# Patient Record
Sex: Male | Born: 1955
Health system: Southern US, Community
[De-identification: ages and names within clinical notes are randomized; demographics above are authoritative.]

## PROBLEM LIST (undated history)

## (undated) DIAGNOSIS — Z9289 Personal history of other medical treatment: Secondary | ICD-10-CM

## (undated) DIAGNOSIS — I480 Paroxysmal atrial fibrillation: Secondary | ICD-10-CM

## (undated) DIAGNOSIS — D649 Anemia, unspecified: Secondary | ICD-10-CM

## (undated) DIAGNOSIS — I10 Essential (primary) hypertension: Secondary | ICD-10-CM

## (undated) DIAGNOSIS — M545 Low back pain: Secondary | ICD-10-CM

## (undated) DIAGNOSIS — K635 Polyp of colon: Secondary | ICD-10-CM

## (undated) DIAGNOSIS — I499 Cardiac arrhythmia, unspecified: Secondary | ICD-10-CM

## (undated) DIAGNOSIS — R011 Cardiac murmur, unspecified: Secondary | ICD-10-CM

## (undated) DIAGNOSIS — G8929 Other chronic pain: Secondary | ICD-10-CM

## (undated) DIAGNOSIS — F419 Anxiety disorder, unspecified: Secondary | ICD-10-CM

## (undated) DIAGNOSIS — C61 Malignant neoplasm of prostate: Secondary | ICD-10-CM

## (undated) DIAGNOSIS — F32A Depression, unspecified: Secondary | ICD-10-CM

## (undated) DIAGNOSIS — K602 Anal fissure, unspecified: Secondary | ICD-10-CM

## (undated) DIAGNOSIS — C775 Secondary and unspecified malignant neoplasm of intrapelvic lymph nodes: Secondary | ICD-10-CM

## (undated) DIAGNOSIS — I421 Obstructive hypertrophic cardiomyopathy: Secondary | ICD-10-CM

## (undated) HISTORY — PX: ACHILLES TENDON REPAIR: SUR1153

## (undated) HISTORY — DX: Cardiac murmur, unspecified: R01.1

## (undated) HISTORY — DX: Malignant neoplasm of prostate: C61

## (undated) HISTORY — DX: Anal fissure, unspecified: K60.2

## (undated) HISTORY — DX: Polyp of colon: K63.5

## (undated) HISTORY — DX: Anxiety disorder, unspecified: F41.9

## (undated) HISTORY — DX: Secondary and unspecified malignant neoplasm of intrapelvic lymph nodes: C61

## (undated) HISTORY — DX: Personal history of other medical treatment: Z92.89

## (undated) HISTORY — PX: PROSTATE BIOPSY: SHX241

## (undated) HISTORY — DX: Secondary and unspecified malignant neoplasm of intrapelvic lymph nodes: C77.5

## (undated) HISTORY — DX: Essential (primary) hypertension: I10

## (undated) HISTORY — PX: COLONOSCOPY: SHX174

---

## 1999-12-01 ENCOUNTER — Ambulatory Visit (HOSPITAL_COMMUNITY): Admission: RE | Admit: 1999-12-01 | Discharge: 1999-12-01 | Payer: Self-pay | Admitting: Family Medicine

## 1999-12-01 ENCOUNTER — Encounter: Payer: Self-pay | Admitting: Family Medicine

## 2002-06-05 HISTORY — PX: EYE SURGERY: SHX253

## 2006-06-05 HISTORY — PX: ANAL FISSURECTOMY: SUR608

## 2007-04-12 ENCOUNTER — Ambulatory Visit: Payer: Self-pay | Admitting: Family Medicine

## 2007-04-12 DIAGNOSIS — I10 Essential (primary) hypertension: Secondary | ICD-10-CM | POA: Insufficient documentation

## 2007-04-12 DIAGNOSIS — R9431 Abnormal electrocardiogram [ECG] [EKG]: Secondary | ICD-10-CM | POA: Insufficient documentation

## 2007-04-12 DIAGNOSIS — Z8679 Personal history of other diseases of the circulatory system: Secondary | ICD-10-CM | POA: Insufficient documentation

## 2007-04-12 DIAGNOSIS — H8110 Benign paroxysmal vertigo, unspecified ear: Secondary | ICD-10-CM | POA: Insufficient documentation

## 2007-04-16 ENCOUNTER — Telehealth (INDEPENDENT_AMBULATORY_CARE_PROVIDER_SITE_OTHER): Payer: Self-pay | Admitting: *Deleted

## 2007-04-26 ENCOUNTER — Ambulatory Visit: Payer: Self-pay | Admitting: Family Medicine

## 2007-04-29 ENCOUNTER — Ambulatory Visit: Payer: Self-pay | Admitting: Cardiology

## 2007-05-01 ENCOUNTER — Encounter (INDEPENDENT_AMBULATORY_CARE_PROVIDER_SITE_OTHER): Payer: Self-pay | Admitting: *Deleted

## 2007-05-13 ENCOUNTER — Ambulatory Visit: Payer: Self-pay | Admitting: Family Medicine

## 2007-05-13 DIAGNOSIS — D492 Neoplasm of unspecified behavior of bone, soft tissue, and skin: Secondary | ICD-10-CM | POA: Insufficient documentation

## 2007-05-13 LAB — CONVERTED CEMR LAB
BUN: 15 mg/dL (ref 6–23)
CO2: 31 meq/L (ref 19–32)
Calcium: 9.5 mg/dL (ref 8.4–10.5)
Chloride: 96 meq/L (ref 96–112)
Creatinine, Ser: 1.4 mg/dL (ref 0.4–1.5)
GFR calc Af Amer: 69 mL/min
GFR calc non Af Amer: 57 mL/min
Glucose, Bld: 77 mg/dL (ref 70–99)
Potassium: 3.2 meq/L — ABNORMAL LOW (ref 3.5–5.1)
Sodium: 135 meq/L (ref 135–145)

## 2007-05-14 LAB — CONVERTED CEMR LAB
BUN: 19 mg/dL (ref 6–23)
CO2: 32 meq/L (ref 19–32)
Calcium: 9.5 mg/dL (ref 8.4–10.5)
Chloride: 103 meq/L (ref 96–112)
Creatinine, Ser: 1.4 mg/dL (ref 0.4–1.5)
GFR calc Af Amer: 69 mL/min
GFR calc non Af Amer: 57 mL/min
Glucose, Bld: 115 mg/dL — ABNORMAL HIGH (ref 70–99)
Potassium: 3.7 meq/L (ref 3.5–5.1)
Sodium: 141 meq/L (ref 135–145)

## 2007-05-15 ENCOUNTER — Encounter: Payer: Self-pay | Admitting: Cardiology

## 2007-05-15 ENCOUNTER — Ambulatory Visit: Payer: Self-pay

## 2007-05-15 ENCOUNTER — Ambulatory Visit: Payer: Self-pay | Admitting: Cardiology

## 2007-06-14 ENCOUNTER — Encounter (INDEPENDENT_AMBULATORY_CARE_PROVIDER_SITE_OTHER): Payer: Self-pay | Admitting: Family Medicine

## 2007-06-17 ENCOUNTER — Ambulatory Visit: Payer: Self-pay | Admitting: Family Medicine

## 2007-06-17 DIAGNOSIS — E785 Hyperlipidemia, unspecified: Secondary | ICD-10-CM | POA: Insufficient documentation

## 2007-06-18 ENCOUNTER — Telehealth (INDEPENDENT_AMBULATORY_CARE_PROVIDER_SITE_OTHER): Payer: Self-pay | Admitting: *Deleted

## 2007-06-18 ENCOUNTER — Encounter (INDEPENDENT_AMBULATORY_CARE_PROVIDER_SITE_OTHER): Payer: Self-pay | Admitting: *Deleted

## 2007-06-18 LAB — CONVERTED CEMR LAB
ALT: 37 units/L (ref 0–53)
AST: 22 units/L (ref 0–37)
BUN: 12 mg/dL (ref 6–23)
CO2: 32 meq/L (ref 19–32)
Calcium: 9.3 mg/dL (ref 8.4–10.5)
Chloride: 101 meq/L (ref 96–112)
Cholesterol: 131 mg/dL (ref 0–200)
Creatinine, Ser: 1.3 mg/dL (ref 0.4–1.5)
GFR calc Af Amer: 75 mL/min
GFR calc non Af Amer: 62 mL/min
Glucose, Bld: 90 mg/dL (ref 70–99)
HDL: 35.8 mg/dL — ABNORMAL LOW (ref >39.0)
LDL Cholesterol: 85 mg/dL (ref 0–99)
Potassium: 3.9 meq/L (ref 3.5–5.1)
Sodium: 141 meq/L (ref 135–145)
Total CHOL/HDL Ratio: 3.7
Triglycerides: 51 mg/dL (ref 0–149)
VLDL: 10 mg/dL (ref 0–40)

## 2007-07-10 ENCOUNTER — Ambulatory Visit (HOSPITAL_BASED_OUTPATIENT_CLINIC_OR_DEPARTMENT_OTHER): Admission: RE | Admit: 2007-07-10 | Discharge: 2007-07-10 | Payer: Self-pay | Admitting: General Surgery

## 2010-07-03 LAB — CONVERTED CEMR LAB
ALT: 35 U/L
AST: 21 U/L
Albumin: 4.3 g/dL
Alkaline Phosphatase: 73 U/L
BUN: 15 mg/dL
Basophils Absolute: 0 K/uL
Basophils Relative: 0.4 %
Bilirubin, Direct: 0.1 mg/dL
CO2: 31 meq/L
Calcium: 9.3 mg/dL
Chloride: 105 meq/L
Cholesterol: 204 mg/dL
Creatinine, Ser: 1.4 mg/dL
Direct LDL: 162.4 mg/dL
Eosinophils Absolute: 0.3 K/uL
Eosinophils Relative: 4.2 %
GFR calc Af Amer: 69 mL/min
GFR calc non Af Amer: 57 mL/min
Glucose, Bld: 83 mg/dL
HCT: 44.9 %
HDL: 37.8 mg/dL — ABNORMAL LOW
Hemoglobin: 14.9 g/dL
Lymphocytes Relative: 25 %
MCHC: 33.2 g/dL
MCV: 82.8 fL
Magnesium: 2 mg/dL
Monocytes Absolute: 0.4 K/uL
Monocytes Relative: 5.3 %
Neutro Abs: 4.5 K/uL
Neutrophils Relative %: 65.1 %
Phosphorus: 2.5 mg/dL
Platelets: 181 K/uL
Potassium: 4 meq/L
RBC: 5.42 M/uL
RDW: 13.2 %
Sodium: 143 meq/L
Total Bilirubin: 0.9 mg/dL
Total CHOL/HDL Ratio: 5.4
Total Protein: 7.2 g/dL
Triglycerides: 66 mg/dL
VLDL: 13 mg/dL
WBC: 7 10*3/microliter

## 2010-07-05 NOTE — Assessment & Plan Note (Signed)
Summary: 1 month f/u//ca   Vital Signs:  Patient Profile:   55 Years Old Male Height:     71.75 inches Weight:      225 pounds Temp:     98.2 degrees F oral Pulse rate:   80 / minute Resp:     16 per minute BP sitting:   136 / 86  (right arm)  Pt. in pain?   no  Vitals Entered By: Ardyth Man (May 13, 2007 2:01 PM)                  Chief Complaint:  1 month f/u on BP.  History of Present Illness: Reports he is doing well. tolerating medication well. No side effects.  Recently seen by Cardiology and scheduled for Stress Echo.   Mr. Code also reports he has rectal lesion that has been present for a long while, but recently noted a new one. Painless.  Current Allergies: No known allergies       Physical Exam  General:     Well-developed,well-nourished,in no acute distress; alert,appropriate and cooperative throughout examination Neck:     No deformities, masses, or tenderness noted. Lungs:     Normal respiratory effort, chest expands symmetrically. Lungs are clear to auscultation, no crackles or wheezes. Heart:     Normal rate and regular rhythm. S1 and S2 normal without gallop, murmur, click, rub or other extra sounds. no murmurs heard on my exam today Rectal:     2 pinkish external anal polyp appearing lesions. Nontender. Extremities:     No clubbing, cyanosis, edema, or deformity noted with normal full range of motion of all joints.      Impression & Recommendations:  Problem # 1:  HYPERTENSION, BENIGN ESSENTIAL (ICD-401.1) Improved Continue current medication Check BMET and F/u in 3 months or sooner if needed His updated medication list for this problem includes:    Hydrochlorothiazide 25 Mg Tabs (Hydrochlorothiazide) .Marland Kitchen... 1 by mouth once daily.  Orders: TLB-BMP (Basic Metabolic Panel-BMET) (80048-METABOL) Venipuncture (16109)  BP today: 136/86 Prior BP: 150/95 (04/12/2007)  Labs Reviewed: Creat: 1.4 (04/26/2007) Chol: 204  (04/12/2007)   HDL: 37.8 (04/12/2007)   LDL: DEL (04/12/2007)   TG: 66 (04/12/2007)   Problem # 2:  NEOPLASM, SKIN,SOFT TISSUE, UNSPEC. (ICD-239.2)  Will refer to General surgeon for removal. Orders: Surgical Referral (Surgery)   Complete Medication List: 1)  Hydrochlorothiazide 25 Mg Tabs (Hydrochlorothiazide) .Marland Kitchen.. 1 by mouth once daily. 2)  Zocor 40 Mg Tabs (Simvastatin) .... Take one tablet daily     ]

## 2010-07-05 NOTE — Progress Notes (Signed)
  Phone Note Outgoing Call Call back at [facility] Phone [phone]   Call placed by: Ardyth Man,  April 16, 2007 12:50 PM Call placed to: Patient Summary of Call: [facility] w/spouse ...................................................................Ardyth Man  April 16, 2007 12:50 PM

## 2010-07-05 NOTE — Letter (Signed)
Summary: Handout Printed  Printed Handout:  - *[facility] Patient Instructions 

## 2010-07-05 NOTE — Progress Notes (Signed)
  Phone Note Outgoing Call Call back at [facility] Phone [phone]   Call placed by: Ardyth Man,  April 16, 2007 12:50 PM Call placed to: Patient Request: Send information Summary of Call: [facility] w/spouse ...................................................................Ardyth Man  April 16, 2007 12:51 PM   Follow-up for Phone Call        Pt. aware, rx sent electronically and pt. has labs already scheduled....................................................................Ardyth Man  April 16, 2007 12:59 PM  Follow-up by: Ardyth Man,  April 16, 2007 12:59 PM    New/Updated Medications: ZOCOR 40 MG  TABS (SIMVASTATIN) Take one tablet daily   Prescriptions: ZOCOR 40 MG  TABS (SIMVASTATIN) Take one tablet daily  #30 x 3   Entered by:   Ardyth Man   Authorized by:   Leanne Chang MD   Signed by:   Ardyth Man on 04/16/2007   Method used:   Electronically sent to ...       [facility] [address]       [address]       [place], [place]  [address]       Ph: [phone]       Fax: [phone]   RxID:   [id number]

## 2010-10-18 NOTE — Op Note (Signed)
NAMECORTLIN, MARANO               ACCOUNT NO.:  [id number]   MEDICAL RECORD NO.:  [id number]          PATIENT TYPE:  AMB   LOCATION:  [facility]                         FACILITY:  [facility]   PHYSICIAN:  Anselm Pancoast. 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   OPERATION:  Examination under anesthesia and fistulotomy with internal  sphincterotomy.   ANESTHESIA:  General anesthesia at the anterior.   HISTORY:  Odell Choung is a 55 year old male, who I first saw  approximately 6 weeks ago when he was having a lot of episodes of pain  spasm.  He had a little anal tag that was palpable, and he was  contributing his pain to the little anal tag.  On examination when I  first saw him in the first week of January, I found no evidence of any  perirectal abscess; but, you could see an anterior fistula with a  __________ papilla distal to that.  You could not examine him very well  because he clamped down so hard because of the pain.  I could not  demonstrate anything that seemed like a fistula in ano, but a fissure.  I excised the little anal tag and sent it for pathology examination; of  course it was just a skin tag.   The patient was improving, but then over the last week he has had  increasing pain and discomfort in the inner aspect of his left leg area.  I saw him in the office on Friday, and obviously he has had a perirectal  abscess that had drained at probably the approximately 1 o'clock  position, just outside of the anal area.  I could put a probe into a  pocket, but I could not demonstrate the internal opening because of  pain.  I recommended that we proceed on with an examination and internal  sphincterotomy and drainage of the abscess.  It was [employer]  weekend  and he had plans, and wanted to not do anything on the following morning  (which was Saturday morning).  Today was the first time to get him on  the OR schedule.  I did place him on antibiotics, and he says his pain  is much less now than when I saw him in the office on Friday.   On examination, he is febrile.  Preoperatively he was given a dose of  Unasyn.  He was taken back to the operative suite.  Induction of general  anesthesia with LAO tube, and placed him up in the yellow fin stirrups.  You could see the little area opening externally that was not as obvious  today as it was when I saw him in the office on Friday.  After prepping  the area with Betadine, I put a little probe in it; it goes down deep.  I could not actually feel the definite internal opening.  I then  took a  little saline and Betadine, and injected it with a blunt needle into the  little external opening.  You could see the little internal fistula that  was anterior.  I slipped a little probe through the external and  internal opening.  This does go deep, to approximately maybe a third of  the internal sphincter fibers.  I then divided it with cautery.   There was a little pocket kind of up in the area, and I opened the  little area externally so that when he has a bowel movement it will not  kind of hang up on this little fold.  I washed the area and could not  find any evidence of any other abscess cavity.   I then used 3-0 chromic to suture the proximal portion, since it  basically in an internal hemorrhoid area.  This is so that hopefully he  will not have any bleeding.  I then also put a couple of figure-of-eight  sutures on both the left and right side of the anal mucosa for  hemostasis (even though there was not a significant amount of bleeding).  I then put Xylocaine ointment within the anal canal; held in place with  a 4x4, and then left just a little packing in.   He will continue on his  antibiotics.  I will plan on seeing him back in  the office in approximately one week.  Hopefully he will be able to  return to work on Monday.   Whether this actually started off as a fissure, or whether he has had a  fistula in ano all along, I am sure I certainly was not able to find any  evidence of an abscess when I first saw him one month ago.   REFERRING PHYSICIAN:  Leanne Chang, M.D.           ______________________________  Anselm Pancoast. Zachery Dakins, M.D.     WJW/MEDQ  D:  07/10/2007  T:  07/11/2007  Job:  [id number]

## 2010-10-18 NOTE — Assessment & Plan Note (Signed)
[facility]                            CARDIOLOGY OFFICE NOTE   NAME:David Smith, David Smith                      MRN:          [id number]  DATE:04/29/2007                            DOB:          1956-01-31    CHIEF COMPLAINT:  Abnormal EKG and shortness of breath.   HISTORY OF PRESENT ILLNESS:  David Smith is a very pleasant, active 55-  year-old married male with 3 children.  He has recently been evaluated  by Dr. Blossom Hoops and was found to have new onset hypertension.  He had  an EKG, which showed normal sinus rhythm with a left axis deviation and  probable early changes of LVH.   His biggest complaint is that of dyspnea on exertion.  This happens when  he carries some firewood into the house or goes up steps.  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   PAST MEDICAL HISTORY:  He has been told he has a heart murmur since he  was about 18.  He does not smoke.  Does not use any recreational drugs.  He drinks about 6 beers per month and had 1 beer in the last year.  He  has 1 coffee a day.   He has no known drug allergies.   CURRENT MEDICATIONS:  1. Simvastatin 40 mg a day.  2. Hydrochlorothiazide 25 mg a day.   He has hyperlipidemia.   SURGICAL HISTORY:  Achilles tendon tear in 1986.   FAMILY HISTORY:  Mother had a heart attack at age 32.   SOCIAL HISTORY:  He is married and has 3 children.  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   REVIEW OF SYSTEMS:  Other than the HPI, negative.   EXAM:  His blood pressure is 136/84, pulse 57 and sinus brady, and  regular.  His weight is 226.  He is 6 feet 2 inches.  HEENT:  Normocephalic, atraumatic.  PERRLA.  Extraocular movements  intact.  Sclerae slightly injected.  Facial symmetry is normal.  Dentition is satisfactory.  NECK:  Supple.  Carotid upstrokes are equal bilaterally without obvious  bruit or referred murmur.  Thyroid is not enlarged.  Trachea is  midline.  LUNGS:  Clear.  HEART:  Reveals a nondisplaced PMI.  He has a systolic murmur along the  left sternal border.  His S2 splits physiologically.  There is no  diastolic component.  ABDOMEN:  Soft with good bowel sounds.  No midline bruit.  No  hepatomegaly.  EXTREMITIES:  With no cyanosis, clubbing, or edema.  Pulses are intact.  NEURO:  Intact.  SKIN:  Unremarkable.   ASSESSMENT:  1. Abnormal EKG.  These findings are most likely attributable to a      left ventricular hypertrophy with hypertension.  He may have had      hypertension longer than he knows.  2. Probable benign flow murmur.  3. Dyspnea on exertion, rule out ischemic equivalent.  4. Hyperlipidemia on simvastatin.   PLAN:  Exercise rest-stress echocardiogram.  We can evaluate left  ventricular  function, question of LVH, look at his aortic valve, and  rule out any significant coronary disease.   We will plan on seeing him back on a p.r.n. basis.     Thomas C. Daleen Squibb, MD, [facility]  Electronically Signed    TCW/MedQ  DD: 04/29/2007  DT: 04/29/2007  Job #: [id number]   cc:   Leanne Chang, M.D.

## 2011-02-24 LAB — POCT I-STAT 4, (NA,K, GLUC, HGB,HCT)
Glucose, Bld: 91
HCT: 48
Hemoglobin: 16.3
Operator id: 280881
Potassium: 3.7
Sodium: 139

## 2011-09-29 ENCOUNTER — Encounter: Payer: Self-pay | Admitting: Internal Medicine

## 2011-09-29 ENCOUNTER — Ambulatory Visit (INDEPENDENT_AMBULATORY_CARE_PROVIDER_SITE_OTHER): Payer: Self-pay | Admitting: Internal Medicine

## 2011-09-29 VITALS — BP 148/90 | HR 62 | Temp 98.4°F | Ht 71.5 in | Wt 228.0 lb

## 2011-09-29 DIAGNOSIS — M25559 Pain in unspecified hip: Secondary | ICD-10-CM | POA: Insufficient documentation

## 2011-09-29 DIAGNOSIS — E785 Hyperlipidemia, unspecified: Secondary | ICD-10-CM

## 2011-09-29 DIAGNOSIS — I10 Essential (primary) hypertension: Secondary | ICD-10-CM

## 2011-09-29 LAB — BASIC METABOLIC PANEL
BUN: 18 mg/dL (ref 6–23)
CO2: 27 mEq/L (ref 19–32)
Calcium: 9.1 mg/dL (ref 8.4–10.5)
Chloride: 106 mEq/L (ref 96–112)
Creatinine, Ser: 1.2 mg/dL (ref 0.4–1.5)
GFR: 80.45 mL/min (ref >60.00)
Glucose, Bld: 83 mg/dL (ref 70–99)
Potassium: 4 mEq/L (ref 3.5–5.1)
Sodium: 141 mEq/L (ref 135–145)

## 2011-09-29 LAB — CBC WITH DIFFERENTIAL/PLATELET
Basophils Absolute: 0 10*3/uL (ref 0.0–0.1)
Basophils Relative: 0.4 % (ref 0.0–3.0)
Eosinophils Absolute: 0.3 10*3/uL (ref 0.0–0.7)
Eosinophils Relative: 5.3 % — ABNORMAL HIGH (ref 0.0–5.0)
HCT: 41.8 % (ref 39.0–52.0)
Hemoglobin: 13.6 g/dL (ref 13.0–17.0)
Lymphocytes Relative: 22.8 % (ref 12.0–46.0)
Lymphs Abs: 1.5 10*3/uL (ref 0.7–4.0)
MCHC: 32.4 g/dL (ref 30.0–36.0)
MCV: 82.3 fl (ref 78.0–100.0)
Monocytes Absolute: 0.4 10*3/uL (ref 0.1–1.0)
Monocytes Relative: 5.7 % (ref 3.0–12.0)
Neutro Abs: 4.2 10*3/uL (ref 1.4–7.7)
Neutrophils Relative %: 65.8 % (ref 43.0–77.0)
Platelets: 146 10*3/uL — ABNORMAL LOW (ref 150.0–400.0)
RBC: 5.08 Mil/uL (ref 4.22–5.81)
RDW: 14.8 % — ABNORMAL HIGH (ref 11.5–14.6)
WBC: 6.4 10*3/uL (ref 4.5–10.5)

## 2011-09-29 MED ORDER — LOSARTAN POTASSIUM-HCTZ 50-12.5 MG PO TABS: 1.0000 | ORAL_TABLET | Freq: Every day | ORAL | Status: DC

## 2011-09-29 NOTE — Progress Notes (Signed)
  Subjective:    Patient ID: David Smith, male    DOB: [birth year], 56 y.o.   MRN: [id number]  HPI New patient , he used to see another doctor in the practice (Dr Blossom Hoops) but hasn't been seen in years. He has a history of hypertension, high cholesterol, used to take hydrochlorothiazide and simvastatin with reportedly good results and no side effects. He ran out of such medicines few years ago.  Past medical history Hyperlipidemia  HTN Heart murmur as a teen Stress ECHO 2008 (-)  Past surgical history Rectal fissure surgery Achilles tendon repair in the 80s  Social history Married, children x 3 (last is 3 y/o) Tobacco--  Never  ETOH-- wine-beers socially    Family history Diabetes--no CAD--no Stroke--no Leukemia-- nephew  Colon cancer-- no Breast cancer-- M Prostate cancer-- F , dx at age in his 2, currently 87 and doing well   Review of Systems In general feels well, denies any chest pain, shortness or breath. No nausea, vomiting, diarrhea. 4 weeks ago, developed a right groin pain, it radiated anteriorly to the leg and sometimes to the R back. Symptoms essentially resolved. Pain was worse w/ certain positions, no testicular pain, no mass @ the groin Denies any headaches or nosebleeds. He is ambulatory BPs have been elevated, systolic BP up to 190. Does not recall the diastolic readings.     Objective:   Physical Exam General -- alert, well-developed, and nooverweight appearing. No apparent distress.  Neck --no  thyromegaly Lungs -- normal respiratory effort, no intercostal retractions, no accessory muscle use, and normal breath sounds.   Heart-- normal rate, regular rhythm, no murmur, and no gallop.   Abdomen--soft, non-tender, no distention, no masses, no HSM, no guarding, and no rigidity.  No evidence of inguinal hernias on physical exam. Extremities-- no pretibial edema bilaterally , hip rotation is normal bilaterally, mild discomfort in the right groin with  right hip rotation. Neurologic-- alert & oriented X3 and strength normal in all extremities. Psych-- Cognition and judgment appear intact. Alert and cooperative with normal attention span and concentration.  not anxious appearing and not depressed appearing.       Assessment & Plan:

## 2011-09-29 NOTE — Assessment & Plan Note (Signed)
H/O high cholesterol, come back for a CPX in 3 weeks, will check his cholesterol then.

## 2011-09-29 NOTE — Assessment & Plan Note (Addendum)
History of hypertension, on no medications for a while, previously treated with hydrochlorothiazide with apparent good results. His blood pressure is elevated today, he is asymptomatic. Plan: EKG at baseline, no acute  BMP, CBC Start losartan HCTZ Return to the office in 3 weeks for an assessment.

## 2011-09-29 NOTE — Assessment & Plan Note (Signed)
Suspect right groin pain is likely from the hip. Recommend Tylenol and observation for now.

## 2011-10-02 ENCOUNTER — Encounter: Payer: Self-pay | Admitting: Internal Medicine

## 2011-11-23 ENCOUNTER — Encounter: Payer: Self-pay | Admitting: Internal Medicine

## 2011-11-23 ENCOUNTER — Telehealth: Payer: Self-pay | Admitting: *Deleted

## 2011-11-23 ENCOUNTER — Ambulatory Visit (INDEPENDENT_AMBULATORY_CARE_PROVIDER_SITE_OTHER): Payer: Self-pay | Admitting: Internal Medicine

## 2011-11-23 VITALS — BP 142/88 | HR 61 | Temp 98.6°F | Ht 71.5 in | Wt 232.0 lb

## 2011-11-23 DIAGNOSIS — Z23 Encounter for immunization: Secondary | ICD-10-CM

## 2011-11-23 DIAGNOSIS — N402 Nodular prostate without lower urinary tract symptoms: Secondary | ICD-10-CM

## 2011-11-23 DIAGNOSIS — Z0000 Encounter for general adult medical examination without abnormal findings: Secondary | ICD-10-CM | POA: Insufficient documentation

## 2011-11-23 DIAGNOSIS — M25559 Pain in unspecified hip: Secondary | ICD-10-CM

## 2011-11-23 DIAGNOSIS — I10 Essential (primary) hypertension: Secondary | ICD-10-CM

## 2011-11-23 LAB — CBC WITH DIFFERENTIAL/PLATELET
Basophils Absolute: 0 10*3/uL (ref 0.0–0.1)
Basophils Relative: 0.4 % (ref 0.0–3.0)
Eosinophils Absolute: 0.2 10*3/uL (ref 0.0–0.7)
Eosinophils Relative: 2.6 % (ref 0.0–5.0)
HCT: 43.7 % (ref 39.0–52.0)
Hemoglobin: 14.2 g/dL (ref 13.0–17.0)
Lymphocytes Relative: 26.7 % (ref 12.0–46.0)
Lymphs Abs: 1.9 10*3/uL (ref 0.7–4.0)
MCHC: 32.5 g/dL (ref 30.0–36.0)
MCV: 82.8 fl (ref 78.0–100.0)
Monocytes Absolute: 0.3 10*3/uL (ref 0.1–1.0)
Monocytes Relative: 4.3 % (ref 3.0–12.0)
Neutro Abs: 4.7 10*3/uL (ref 1.4–7.7)
Neutrophils Relative %: 66 % (ref 43.0–77.0)
Platelets: 179 10*3/uL (ref 150.0–400.0)
RBC: 5.28 Mil/uL (ref 4.22–5.81)
RDW: 15 % — ABNORMAL HIGH (ref 11.5–14.6)
WBC: 7.1 10*3/uL (ref 4.5–10.5)

## 2011-11-23 LAB — COMPREHENSIVE METABOLIC PANEL
ALT: 29 U/L (ref 0–53)
AST: 23 U/L (ref 0–37)
Albumin: 4.4 g/dL (ref 3.5–5.2)
Alkaline Phosphatase: 74 U/L (ref 39–117)
BUN: 17 mg/dL (ref 6–23)
CO2: 29 mEq/L (ref 19–32)
Calcium: 9.1 mg/dL (ref 8.4–10.5)
Chloride: 106 mEq/L (ref 96–112)
Creatinine, Ser: 1.4 mg/dL (ref 0.4–1.5)
GFR: 67.39 mL/min (ref >60.00)
Glucose, Bld: 88 mg/dL (ref 70–99)
Potassium: 3.8 mEq/L (ref 3.5–5.1)
Sodium: 141 mEq/L (ref 135–145)
Total Bilirubin: 0.7 mg/dL (ref 0.3–1.2)
Total Protein: 7.3 g/dL (ref 6.0–8.3)

## 2011-11-23 LAB — LIPID PANEL
Cholesterol: 188 mg/dL (ref 0–200)
HDL: 46.6 mg/dL (ref >39.00)
LDL Cholesterol: 131 mg/dL — ABNORMAL HIGH (ref 0–99)
Total CHOL/HDL Ratio: 4
Triglycerides: 54 mg/dL (ref 0.0–149.0)
VLDL: 10.8 mg/dL (ref 0.0–40.0)

## 2011-11-23 LAB — PSA: PSA: 34.25 ng/mL — ABNORMAL HIGH (ref 0.10–4.00)

## 2011-11-23 LAB — TSH: TSH: 1.03 u[IU]/mL (ref 0.35–5.50)

## 2011-11-23 MED ORDER — LOSARTAN POTASSIUM-HCTZ 100-25 MG PO TABS: 1.0000 | ORAL_TABLET | Freq: Every day | ORAL | Status: DC

## 2011-11-23 NOTE — Assessment & Plan Note (Signed)
We talk about possible orthopedic referral, patient prefers to wait a few weeks. Recommend Tylenol as needed.

## 2011-11-23 NOTE — Telephone Encounter (Signed)
Pt left VM that he was seen today but has another question that he would like to ask Dr Drue Novel. Pt request a call back. .Left message to call office

## 2011-11-23 NOTE — Assessment & Plan Note (Signed)
Tolerated losartan HCT well, BP slightly elevated today, normal motor BPs. Plan: Increase losartan dose to 100. See new prescription and instructions

## 2011-11-23 NOTE — Telephone Encounter (Signed)
Spoke with pt, he is requesting that Dr. Drue Novel call him for about a conversation. Pt did not want to tell me what it was regarding & only wanted to talk to Dr. Drue Novel. Best # to reach pt at is [phone].

## 2011-11-23 NOTE — Progress Notes (Signed)
  Subjective:    Patient ID: David Smith, male    DOB: Mar 31, 1956, 56 y.o.   MRN: [id number]  HPI Complete physical exam  Past medical history Hyperlipidemia   HTN Heart murmur as a teen Stress ECHO 2008 (-)  Past surgical history Rectal fissure surgery Achilles tendon repair in the 80s  Social history Married, children x 3 (last is 3 y/o) Tobacco--  Never   ETOH-- wine-beers socially  Occupation--  Actor Diet-- healthy most of the time  Exercise-- used to play tennis  Family history Diabetes--no CAD--no Stroke--no Leukemia-- nephew   Colon cancer-- no Breast cancer-- M Prostate cancer-- F , dx at age in his 100, currently 45 and doing well    Review of Systems Since the last time he was here, he started losartan, no side effects, no ambulatory BPs. Continue with right hip pain. Not severe but still bothering him some days. Denies any chest pain, shortness of breath Note nausea, vomiting, diarrhea. No blood in the stools. No dysuria or gross hematuria.    Objective:   Physical Exam General -- alert, well-developed, and well-nourished.   Neck --no thyromegaly , normal carotid pulse Lungs -- normal respiratory effort, no intercostal retractions, no accessory muscle use, and normal breath sounds.   Heart-- normal rate, regular rhythm, no murmur, and no gallop.   Abdomen--soft, non-tender, no distention, no masses, no HSM, no guarding, and no rigidity.   Extremities-- no pretibial edema bilaterally Rectal-- No external abnormalities noted. Normal sphincter tone. No rectal masses or tenderness. Brown stool, Hemoccult negative Prostate:  Has a 1 cm hard nodule on the left. Right side seems normal Neurologic-- alert & oriented X3 and strength normal in all extremities. Psych-- Cognition and judgment appear intact. Alert and cooperative with normal attention span and concentration.  not anxious appearing and not depressed appearing.       Assessment &  Plan:

## 2011-11-23 NOTE — Assessment & Plan Note (Addendum)
Tdap-- last many years ago per pt, provided one today Diet-exercise discussed Has a family history of prostate cancer, his brother has the elevated PSA. He has a prostate nodule. Check a PSA today but  but he will be referred to urology today for further eval of the nodule. Never had a colonoscopy, since this year he is going to see if the urologist, will provide a Hemoccult. Discuss colonoscopy next year. Diet and exercise discussed

## 2011-11-23 NOTE — Patient Instructions (Addendum)
Increase your BP medication Check the  blood pressure 2 or 3 times a week, be sure it is between 110/60 and 140/85. If it is consistently higher or lower, let me know Please come back in 2 month for labs only (BMP dx HTN) Next visit with me in 6 months as long as your BP is well-controlled

## 2011-11-24 NOTE — Telephone Encounter (Signed)
LMOM asked for a call back. Labs ok , PSA 34.25, he has already being referred to urology d/t a nodule

## 2011-11-28 NOTE — Telephone Encounter (Signed)
Please call patient, labs were okay, PSA is very high, he definitely needs to see urology. Please let me talk to him if needed.

## 2011-11-28 NOTE — Telephone Encounter (Signed)
LMOVM for pt to return call 

## 2011-11-29 NOTE — Telephone Encounter (Signed)
Discussed with pt & has an appt with urology.

## 2011-12-25 ENCOUNTER — Telehealth: Payer: Self-pay | Admitting: *Deleted

## 2011-12-25 NOTE — Telephone Encounter (Signed)
Pt states that BP med is too expensive and the pharmacy suggest Pt try lisinopril which would be cheaper.Pt would like to know if this is a option Please advise

## 2011-12-25 NOTE — Telephone Encounter (Signed)
Losartan HCT is actually a generic but if he must change to something even less expensive then tried: Lisinopril HCT 20-12.5 mg 2 tablets daily #60, 3 RF BMP in 6 weeks, call if BP increases more than 140/835 Watch for side effects mainly cough or allergic reactions (lip swelling)

## 2011-12-26 NOTE — Telephone Encounter (Signed)
Left message to call office

## 2012-01-05 MED ORDER — LISINOPRIL-HYDROCHLOROTHIAZIDE 20-12.5 MG PO TABS: 2.0000 | ORAL_TABLET | Freq: Every day | ORAL | Status: DC

## 2012-01-05 NOTE — Telephone Encounter (Signed)
Discussed with pt, sent in rx to pharmacy.  

## 2012-01-05 NOTE — Telephone Encounter (Signed)
Left message to call office

## 2012-01-31 ENCOUNTER — Telehealth: Payer: Self-pay | Admitting: Internal Medicine

## 2012-01-31 NOTE — Telephone Encounter (Signed)
6 weeks ago, BP medication was changed. 1. Needs a BMP, DX hypertension please arrange . 2. Is BP well controlled ?  let me know if is not

## 2012-02-01 NOTE — Telephone Encounter (Signed)
Pt called back and scheduled OV. Pt wanted to also inform Dr Drue Novel of a increase in headache since med change.

## 2012-02-01 NOTE — Telephone Encounter (Signed)
Spoke with pt. He reports that he has not started lisinopril HCT yet as he just took his last Losartan HCT today. He has noticed an increase of headaches x 2 months (about the time he started the losartan hct). Pt states he is going to start lisinopril hct tomorrow and will monitor headaches going forward. Advised pt to let us know if headache worsens or does not improve after switching to lisinopril hct.

## 2012-02-01 NOTE — Telephone Encounter (Signed)
If lisinopril- HCT is causing headaches, he should go back to losartan HCT even if it is a little more expensive. Please discuss that with the patient

## 2012-02-01 NOTE — Telephone Encounter (Signed)
Left msg with wife for pt to return phone call.

## 2012-02-01 NOTE — Telephone Encounter (Signed)
Pt return call stating that BP has been running around the 120s to 76s. Pt will call later to schedule appt

## 2012-02-02 NOTE — Telephone Encounter (Signed)
Noted  

## 2012-02-09 ENCOUNTER — Other Ambulatory Visit: Payer: Self-pay

## 2012-02-16 ENCOUNTER — Other Ambulatory Visit (HOSPITAL_COMMUNITY): Payer: Self-pay | Admitting: Urology

## 2012-02-16 ENCOUNTER — Other Ambulatory Visit: Payer: Self-pay

## 2012-02-16 DIAGNOSIS — C61 Malignant neoplasm of prostate: Secondary | ICD-10-CM

## 2012-03-01 ENCOUNTER — Encounter (HOSPITAL_COMMUNITY)
Admission: RE | Admit: 2012-03-01 | Discharge: 2012-03-01 | Disposition: A | Discharge status: Home / Self Care | Payer: Self-pay | Source: Ambulatory Visit | Attending: Urology | Admitting: Urology

## 2012-03-01 ENCOUNTER — Encounter (HOSPITAL_COMMUNITY): Payer: Self-pay

## 2012-03-01 DIAGNOSIS — C61 Malignant neoplasm of prostate: Secondary | ICD-10-CM

## 2012-03-28 ENCOUNTER — Other Ambulatory Visit (HOSPITAL_COMMUNITY): Payer: Self-pay | Admitting: Urology

## 2012-03-28 DIAGNOSIS — C61 Malignant neoplasm of prostate: Secondary | ICD-10-CM

## 2012-04-04 ENCOUNTER — Ambulatory Visit (HOSPITAL_COMMUNITY)
Admission: RE | Admit: 2012-04-04 | Discharge: 2012-04-04 | Disposition: A | Discharge status: Home / Self Care | Payer: Medicaid Other | Source: Ambulatory Visit | Attending: Urology | Admitting: Urology

## 2012-04-04 ENCOUNTER — Other Ambulatory Visit: Payer: Self-pay | Admitting: Urology

## 2012-04-04 DIAGNOSIS — C61 Malignant neoplasm of prostate: Secondary | ICD-10-CM

## 2012-04-04 LAB — CREATININE, SERUM
Creatinine, Ser: 1.46 mg/dL — ABNORMAL HIGH (ref 0.50–1.35)
GFR calc Af Amer: 61 mL/min — ABNORMAL LOW (ref >90)
GFR calc non Af Amer: 53 mL/min — ABNORMAL LOW (ref >90)

## 2012-04-04 MED ORDER — GADOBENATE DIMEGLUMINE 529 MG/ML IV SOLN
20.0000 mL | Freq: Once | INTRAVENOUS | Status: AC | PRN
  Administered 2012-04-04: 20 mL via INTRAVENOUS

## 2012-05-17 ENCOUNTER — Other Ambulatory Visit: Payer: Self-pay | Admitting: Urology

## 2012-05-28 ENCOUNTER — Encounter: Payer: Self-pay | Admitting: *Deleted

## 2012-05-28 DIAGNOSIS — C61 Malignant neoplasm of prostate: Secondary | ICD-10-CM | POA: Insufficient documentation

## 2012-05-28 NOTE — Progress Notes (Signed)
Married, 2 daughters, 1 son, Special educational needs teacher

## 2012-05-30 ENCOUNTER — Encounter: Payer: Self-pay | Admitting: *Deleted

## 2012-06-03 ENCOUNTER — Ambulatory Visit
Admission: RE | Admit: 2012-06-03 | Discharge: 2012-06-03 | Disposition: A | Discharge status: Home / Self Care | Payer: Medicaid Other | Source: Ambulatory Visit | Attending: Radiation Oncology | Admitting: Radiation Oncology

## 2012-06-03 ENCOUNTER — Encounter: Payer: Self-pay | Admitting: Radiation Oncology

## 2012-06-03 VITALS — BP 142/91 | HR 63 | Temp 99.4°F | Resp 20 | Wt 238.1 lb

## 2012-06-03 DIAGNOSIS — R011 Cardiac murmur, unspecified: Secondary | ICD-10-CM | POA: Insufficient documentation

## 2012-06-03 DIAGNOSIS — I10 Essential (primary) hypertension: Secondary | ICD-10-CM | POA: Insufficient documentation

## 2012-06-03 DIAGNOSIS — C61 Malignant neoplasm of prostate: Secondary | ICD-10-CM

## 2012-06-03 NOTE — Progress Notes (Signed)
Please see the Nurse Progress Note in the MD Initial Consult Encounter for this patient. 

## 2012-06-03 NOTE — Progress Notes (Signed)
Radiation Oncology         [phone] ________________________________  Initial outpatient Consultation  Name: David Smith MRN: [id number]  Date: 06/03/2012  DOB: 03/18/1956  WU:[id number] Drue Novel, MD  Crecencio Mc, MD   REFERRING PHYSICIAN: Crecencio Mc, MD  DIAGNOSIS: 56 y.o. gentleman with stage T2a adenocarcinoma of the prostate with a Gleason's score of 4+3 and a PSA of 34.25  HISTORY OF PRESENT ILLNESS::David Smith is a 56 y.o. gentleman.  He was noted to have an elevated PSA of 34.25 with a prostate nodule by his primary care physician, Dr. Drue Novel.  Accordingly, he was referred for evaluation in urology by Dr. Brunilda Payor on 01/01/12,  digital rectal examination was performed at that time revealing a 2+ gland with a nodule at the left apex.  The patient proceeded to transrectal ultrasound with 12 biopsies of the prostate on 9/3.  Out of 12 core biopsies,6 were positive.  The maximum Gleason score was 4+3, and this was seen in 90% of the left lateral apex and 70% of the Left Base, with 3+4 in all of the remaining left sided cores, with no malignancy on the right.  The patient reviewed the biopsy results with Dr. Brunilda Payor .  He underwent additional staging studies including a sodium flouride bone scan which showed non-diagnostic increased uptake in T11, C5, spine shoulders and pelvis, but no overt metastatic disease.  A prostate MRI which showed diffuse tumor throughout the left lobe, but no overt extracapsular extension.  He met with Dr. Laverle Patter to discuss possible robotic prostatectomy, and has also kindly been referred today for discussion of potential radiation treatment options.   PREVIOUS RADIATION THERAPY: No  PAST MEDICAL HISTORY:  has a past medical history of Prostate cancer (02/06/12); Heart murmur; and Hypertension.    PAST SURGICAL HISTORY: Past Surgical History  Procedure Date  . Achilles tendon repair     26 yrs ago, right foot  . Anal fissurectomy 2008    FAMILY HISTORY: family  history includes Cancer in his mother; Cancer (age of onset:58) in his brother; Cancer (age of onset:70) in his father; and Heart disease in his mother.  SOCIAL HISTORY:  reports that he has never smoked. He has never used smokeless tobacco. He reports that he drinks alcohol. He reports that he does not use illicit drugs.  ALLERGIES: Review of patient's allergies indicates no known allergies.  MEDICATIONS:  Current Outpatient Prescriptions  Medication Sig Dispense Refill  . acetaminophen (TYLENOL) 325 MG tablet Take 650 mg by mouth every 6 (six) hours as needed.      Marland Kitchen lisinopril-hydrochlorothiazide (ZESTORETIC) 20-12.5 MG per tablet Take 2 tablets by mouth daily.  180 tablet  0  . sildenafil (VIAGRA) 100 MG tablet Take 100 mg by mouth daily as needed.        REVIEW OF SYSTEMS:  A 15 point review of systems is documented in the electronic medical record. This was obtained by the nursing staff. However, I reviewed this with the patient to discuss relevant findings and make appropriate changes.  A comprehensive review of systems was negative..  The patient completed an IPSS and IIEF questionnaire.  His IPSS score was 5 indicating mild urinary outflow obstructive symptoms.    PHYSICAL EXAM: This patient is in no acute distress.  He is alert and oriented.   weight is 238 lb 1.6 oz (108.001 kg). His oral temperature is 99.4 F (37.4 C). His blood pressure is 142/91 and his pulse is 63. His respiration  is 20.  He exhibits no respiratory distress or labored breathing.  He appears neurologically intact.  His mood is pleasant.  His affect is appropriate.  Please note the digital rectal exam findings described above.  LABORATORY DATA:  Lab Results  Component Value Date   WBC 7.1 11/23/2011   HGB 14.2 11/23/2011   HCT 43.7 11/23/2011   MCV 82.8 11/23/2011   PLT 179.0 11/23/2011   Lab Results  Component Value Date   NA 141 11/23/2011   K 3.8 11/23/2011   CL 106 11/23/2011   CO2 29 11/23/2011   Lab  Results  Component Value Date   ALT 29 11/23/2011   AST 23 11/23/2011   ALKPHOS 74 11/23/2011   BILITOT 0.7 11/23/2011     RADIOGRAPHY: No results found.    IMPRESSION: This gentleman is a very nice 56 year old with stage T2a adenocarcinoma of the prostate with a Gleason's score of 4+3 and a PSA of 34.25.  His PSA put him into the high risk group.  Accordingly he is eligible for a variety of potential treatment options including up-front prostatectomy followed by tailored surveillance with a potential for adjuvant or salvage radiation depending on pathology and lab findings.  He could also undergo external radiation with hormones plus or minus a seed implant boost.  There are pros and cons to each of these options.  PLAN:Today I reviewed the findings and workup thus far.  We discussed the natural history of prostate cancer.  We reviewed the the implications of T-stage, Gleason's Score, and PSA on decision-making and outcomes related to prostate cancer.  We discussed radiation treatment in the management of prostate cancer with regard to the logistics and delivery of external beam radiation treatment as well as the logistics and delivery of prostate brachytherapy.  We compared and contrasted each of these approaches and also compared these against prostatectomy.    The patient is most of his questions and interest in robotic-assisted laparoscopic radical prostatectomy.  We discussed some of the potential and images of surgery including surgical staging, the availability of salvage radiotherapy to the prostatic fossa, and the confidence associated with immediate biochemical results.  We discussed some of the potential proven indications for postoperative radiotherapy including positive margins, extracapsular extension, and seminal vesicle involvement. We also talked about some of the other potential findings leading to a recommendation for radiotherapy including a non-zero postoperative PSA and positive  lymph nodes.   I filled out a patient counseling form for him outlining his disease characteristics with relevant treatment diagrams and a thorough delineation of radiation the logistics. The counseling form highlighted our discussion on the potential side effects associated with radiation therapy. The patient was given the form and we retained a copy for our records.   The patient would like to proceed with prostatectomy. I enjoyed meeting with him today, and will look forward to participating in the care of this very nice gentleman.  I will look forward to following his progress   I spent 60 minutes minutes face to face with the patient and more than 50% of that time was spent in counseling and/or coordination of care.    ------------------------------------------------  Artist Pais. Kathrynn Running, M.D.

## 2012-06-03 NOTE — Progress Notes (Signed)
Pt deneis pain, fatigue, loss of appetite. He is self-employed Special educational needs teacher.  IPSS score today 5.

## 2012-06-06 ENCOUNTER — Encounter (HOSPITAL_COMMUNITY): Payer: Self-pay | Admitting: Pharmacy Technician

## 2012-06-10 ENCOUNTER — Other Ambulatory Visit: Payer: Self-pay | Admitting: *Deleted

## 2012-06-10 ENCOUNTER — Other Ambulatory Visit: Payer: Self-pay | Admitting: Internal Medicine

## 2012-06-10 NOTE — Telephone Encounter (Signed)
Refill done.  

## 2012-06-11 ENCOUNTER — Encounter (HOSPITAL_COMMUNITY)
Admission: RE | Admit: 2012-06-11 | Discharge: 2012-06-11 | Disposition: A | Discharge status: Home / Self Care | Payer: Medicaid Other | Source: Ambulatory Visit | Attending: Urology | Admitting: Urology

## 2012-06-11 ENCOUNTER — Ambulatory Visit (HOSPITAL_COMMUNITY)
Admission: RE | Admit: 2012-06-11 | Discharge: 2012-06-11 | Disposition: A | Discharge status: Home / Self Care | Payer: Medicaid Other | Source: Ambulatory Visit | Attending: Urology | Admitting: Urology

## 2012-06-11 ENCOUNTER — Encounter (HOSPITAL_COMMUNITY): Payer: Self-pay

## 2012-06-11 DIAGNOSIS — Z01818 Encounter for other preprocedural examination: Secondary | ICD-10-CM | POA: Insufficient documentation

## 2012-06-11 DIAGNOSIS — C61 Malignant neoplasm of prostate: Secondary | ICD-10-CM | POA: Insufficient documentation

## 2012-06-11 DIAGNOSIS — I517 Cardiomegaly: Secondary | ICD-10-CM | POA: Insufficient documentation

## 2012-06-11 DIAGNOSIS — M545 Low back pain, unspecified: Secondary | ICD-10-CM

## 2012-06-11 DIAGNOSIS — G8929 Other chronic pain: Secondary | ICD-10-CM

## 2012-06-11 DIAGNOSIS — Z01812 Encounter for preprocedural laboratory examination: Secondary | ICD-10-CM | POA: Insufficient documentation

## 2012-06-11 HISTORY — DX: Other chronic pain: G89.29

## 2012-06-11 HISTORY — DX: Low back pain, unspecified: M54.50

## 2012-06-11 HISTORY — DX: Low back pain: M54.5

## 2012-06-11 HISTORY — PX: OTHER SURGICAL HISTORY: SHX169

## 2012-06-11 LAB — CBC
HCT: 42 % (ref 39.0–52.0)
Hemoglobin: 13.9 g/dL (ref 13.0–17.0)
MCH: 26.9 pg (ref 26.0–34.0)
MCHC: 33.1 g/dL (ref 30.0–36.0)
MCV: 81.4 fL (ref 78.0–100.0)
Platelets: 191 10*3/uL (ref 150–400)
RBC: 5.16 MIL/uL (ref 4.22–5.81)
RDW: 13.8 % (ref 11.5–15.5)
WBC: 6.7 10*3/uL (ref 4.0–10.5)

## 2012-06-11 LAB — BASIC METABOLIC PANEL
BUN: 19 mg/dL (ref 6–23)
CO2: 29 mEq/L (ref 19–32)
Calcium: 9.5 mg/dL (ref 8.4–10.5)
Chloride: 104 mEq/L (ref 96–112)
Creatinine, Ser: 1.46 mg/dL — ABNORMAL HIGH (ref 0.50–1.35)
GFR calc Af Amer: 61 mL/min — ABNORMAL LOW (ref >90)
GFR calc non Af Amer: 53 mL/min — ABNORMAL LOW (ref >90)
Glucose, Bld: 91 mg/dL (ref 70–99)
Potassium: 3.9 mEq/L (ref 3.5–5.1)
Sodium: 140 mEq/L (ref 135–145)

## 2012-06-11 LAB — NO BLOOD PRODUCTS

## 2012-06-11 LAB — SURGICAL PCR SCREEN
MRSA, PCR: NEGATIVE
Staphylococcus aureus: NEGATIVE

## 2012-06-11 NOTE — Patient Instructions (Addendum)
20 DAMASO LADAY  06/11/2012   Your procedure is scheduled on:  1-13 -2014  Report to [facility] at       0515 AM.  Call this number if you have problems the morning of surgery: [phone]  Or Presurgical Testing [phone](Ferris Tally)   Remember: Follow any bowel prep instructions per MD office.    Do not eat food:After Midnight.    Take these medicines the morning of surgery with A SIP OF WATER: Tylenol   Do not wear jewelry, make-up or nail polish.  Do not wear lotions, powders, or perfumes. You may wear deodorant.  Do not shave 48 hours prior to surgery.(face and neck okay, no shaving of legs)  Do not bring valuables to the hospital.  Contacts, dentures or bridgework,body piercing,  may not be worn into surgery.  Leave suitcase in the car. After surgery it may be brought to your room.  For patients admitted to the hospital, checkout time is 11:00 AM the day of discharge.   Patients discharged the day of surgery will not be allowed to drive home. Must have responsible person with you x 24 hours once discharged.  Name and phone number of your driver: spouse Daphney- [phone] cell  Special Instructions: CHG Shower Use Special Wash: see special instructions.(avoid face and genitals)   Please read over the following fact sheets that you were given: MRSA Information,No Blood Transfusion fact sheet(Jehovah Witness), Incentive Spirometry Instruction.    Failure to follow these instructions may result in Cancellation of your surgery.   Patient signature_______________________________________________________

## 2012-06-11 NOTE — Pre-Procedure Instructions (Signed)
06-11-12 EKG 09-29-11-Epic. CXR done today. Pt. Refuses Blood products-is Jehovah Witness. David Smith

## 2012-06-14 NOTE — H&P (Signed)
Chief Complaint  Prostate Cancer     History of Present Illness  David Smith is a 57 year old who was found to have an elevated PSA of 34.3 during a physical exam by Dr. Drue Novel this summer.  This was his first PSA.  He was also noted to have a prostate nodule at the left apex.  He was seen by Dr. Brunilda Payor and underwent a prostate biopsy on 02/06/12 which demonstrated a hypoechoic area at the left apex and biopsy results confirmed Gleason 4+3=7 adenocarcinoma with 6 out of 12 biopsy cores positive for malignancy (all on the left side). He underwent a sodium flouride PET scan on 03/01/12 which demonstrated some uptake at the C5 and T 11 vertebrae and the posterior right ilium.  While suspicious, these areas were not definitively felt to represent metastases. An endorectal MRI of the prostate on 04/04/12 demonstrated apparent tumor all throughout the left side of the prostate with areas of concerning diffusion restriction at the left apex again consistent with his biopsy and DRE findings. He did have some areas at the right apex and right base that were also suspicious for malignancy.  There was bulging of the capsule on the left side of the prostate at the posterior apex but no clear extraprostatic disease.  No lymphadenopathy, seminal vesicle invasion, or other evidence of metastasis was noted.  He is in good overall health with a history of hypertension.  He has a history of an anal fissue s/p surgical treatment. His father was diagnosed with prostate cancer in his early 31s and underwent surgical therapy and is still living in his 55s. His brother was recently diagnosed with prostate cancer at age 16 and was treated with radiation therapy.  TNM stage: cT2a N0 M0 PSA: 34.3 cc Gleason score: 4+3=7 Biopsy (02/06/12): 6/12 cores positive -- L lateral apex (90%, 4+3=7), L apex (80%, 3+4=7, PNI), L lateral mid (80%, 3+4=7, PNI), L mid (50%, 3+4=7, PNI), L lateral base (90%, 3+4=7, PNI), L base (70%, 4+3=7, PNI) Prostate  volume: 56.4 cc  Nomogram: OC disease: 37% EPE: 69% SVI: 38% LNI: 8.2% PFS (surgery): 73%, 63%  Urinary function: He has minimal lower urinary tract symptoms. IPSS is 6.  Erectile function: He does have moderate erectile dysfunction. SHIM score is 17. He estimates that he can achieve an erection spontaneously adequate for intercourse only approximately 1/3 of the time. He has tried Viagra and recently with some mixed results.   Past Medical History Problems  1. History of  Hypertension 401.9 2. History of  Murmurs 785.2  Surgical History Problems  1. History of  Anal Fissurectomy 2. History of  Primary Repair Of Ruptured Achilles Tendon  Current Meds 1. Aspirin 325 MG Oral Tablet; TAKE TABLET  PRN; Therapy: (Recorded:29Jul2013) to 2. Diazepam 10 MG Oral Tablet; TAKE 10 MG Once TAKE 15 MINUTES PRIOR TO PROCEDURE;  Therapy: 24Oct2013 to (Evaluate:25Oct2013); Last Rx:24Oct2013 3. Losartan Potassium-HCTZ 50-12.5 MG Oral Tablet; Therapy: (Recorded:29Jul2013) to 4. Tylenol TABS; prn; Therapy: (Recorded:29Jul2013) to 5. Viagra 100 MG Oral Tablet; Take 1/2-1 tab po to 1h prior to sexual activity; Therapy:  12Sep2013 to (Last Rx:12Sep2013)  Requested for: 12Sep2013  Allergies Medication  1. No Known Drug Allergies  Family History Problems  1. Maternal history of  Congestive Heart Failure 2. 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      Physical Exam Constitutional: Well nourished and well developed . No acute distress.    ENT:. The ears and nose are normal in appearance.  Neck: The appearance of the neck is normal and no neck mass is present.  Pulmonary: No respiratory distress, normal respiratory rhythm and effort and clear bilateral breath sounds.  Cardiovascular: Heart rate and rhythm are normal . No peripheral edema.  Abdomen: The abdomen is soft and nontender. No masses are palpated. No CVA tenderness. No hernias are palpable. No hepatosplenomegaly noted.     Assessment Assessed  1. Adenocarcinoma Of The Prostate Gland 185    Discussion/Summary  1. High risk localized vs. possible locally advanced prostate cancer: He has been counseled about his options And has chosen to proceed with surgical therapy for his prostate cancer. He will undergo a robotic radical prostatectomy and pelvic lymphadenectomy. 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  He understands that this operation carries a risk of bleeding and that refusal to accept a blood transfusion in the event of bleeding does increase his risks with this procedure including death. He accepts this risk.

## 2012-06-17 ENCOUNTER — Encounter (HOSPITAL_COMMUNITY): Payer: Self-pay | Admitting: *Deleted

## 2012-06-17 ENCOUNTER — Observation Stay (HOSPITAL_COMMUNITY)
Admission: RE | Admit: 2012-06-17 | Discharge: 2012-06-18 | Disposition: A | Discharge status: Home / Self Care | Payer: Medicaid Other | Source: Ambulatory Visit | Attending: Urology | Admitting: Urology

## 2012-06-17 ENCOUNTER — Encounter (HOSPITAL_COMMUNITY)
Admission: RE | Disposition: A | Discharge status: Home / Self Care | Payer: Self-pay | Source: Ambulatory Visit | Attending: Urology

## 2012-06-17 ENCOUNTER — Ambulatory Visit (HOSPITAL_COMMUNITY): Payer: Medicaid Other | Admitting: *Deleted

## 2012-06-17 DIAGNOSIS — Z7982 Long term (current) use of aspirin: Secondary | ICD-10-CM | POA: Insufficient documentation

## 2012-06-17 DIAGNOSIS — I10 Essential (primary) hypertension: Secondary | ICD-10-CM | POA: Insufficient documentation

## 2012-06-17 DIAGNOSIS — C61 Malignant neoplasm of prostate: Principal | ICD-10-CM | POA: Insufficient documentation

## 2012-06-17 DIAGNOSIS — Z79899 Other long term (current) drug therapy: Secondary | ICD-10-CM | POA: Insufficient documentation

## 2012-06-17 DIAGNOSIS — R011 Cardiac murmur, unspecified: Secondary | ICD-10-CM | POA: Insufficient documentation

## 2012-06-17 HISTORY — PX: ROBOT ASSISTED LAPAROSCOPIC RADICAL PROSTATECTOMY: SHX5141

## 2012-06-17 HISTORY — PX: LYMPHADENECTOMY: SHX5960

## 2012-06-17 LAB — NO BLOOD PRODUCTS

## 2012-06-17 LAB — HEMOGLOBIN AND HEMATOCRIT, BLOOD
HCT: 38.6 % — ABNORMAL LOW (ref 39.0–52.0)
Hemoglobin: 12.7 g/dL — ABNORMAL LOW (ref 13.0–17.0)

## 2012-06-17 SURGERY — ROBOTIC ASSISTED LAPAROSCOPIC RADICAL PROSTATECTOMY LEVEL 2
Anesthesia: General | Wound class: Clean Contaminated

## 2012-06-17 MED ORDER — HYDRALAZINE HCL 20 MG/ML IJ SOLN
INTRAMUSCULAR | Status: DC | PRN
  Administered 2012-06-17 (×2): 5 mg via INTRAVENOUS

## 2012-06-17 MED ORDER — BUPIVACAINE-EPINEPHRINE PF 0.25-1:200000 % IJ SOLN
INTRAMUSCULAR | Status: AC
  Filled 2012-06-17: qty 30

## 2012-06-17 MED ORDER — DIPHENHYDRAMINE HCL 12.5 MG/5ML PO ELIX: 12.5000 mg | ORAL_SOLUTION | Freq: Four times a day (QID) | ORAL | Status: DC | PRN

## 2012-06-17 MED ORDER — MORPHINE SULFATE 2 MG/ML IJ SOLN
2.0000 mg | INTRAMUSCULAR | Status: DC | PRN
Start: 2012-06-17 — End: 2012-06-18

## 2012-06-17 MED ORDER — KCL IN DEXTROSE-NACL 20-5-0.45 MEQ/L-%-% IV SOLN
INTRAVENOUS | Status: AC
  Filled 2012-06-17: qty 1000

## 2012-06-17 MED ORDER — CEFAZOLIN SODIUM-DEXTROSE 2-3 GM-% IV SOLR
2.0000 g | INTRAVENOUS | Status: AC
  Administered 2012-06-17: 2 g via INTRAVENOUS

## 2012-06-17 MED ORDER — HEPARIN SODIUM (PORCINE) 1000 UNIT/ML IJ SOLN
INTRAMUSCULAR | Status: AC
  Filled 2012-06-17: qty 1

## 2012-06-17 MED ORDER — INDIGOTINDISULFONATE SODIUM 8 MG/ML IJ SOLN
INTRAMUSCULAR | Status: AC
  Filled 2012-06-17: qty 10

## 2012-06-17 MED ORDER — ACETAMINOPHEN 10 MG/ML IV SOLN
INTRAVENOUS | Status: DC | PRN
  Administered 2012-06-17: 1000 mg via INTRAVENOUS

## 2012-06-17 MED ORDER — DIPHENHYDRAMINE HCL 50 MG/ML IJ SOLN: 12.5000 mg | Freq: Four times a day (QID) | INTRAMUSCULAR | Status: DC | PRN

## 2012-06-17 MED ORDER — LACTATED RINGERS IR SOLN
Status: DC | PRN
  Administered 2012-06-17: 1000 mL

## 2012-06-17 MED ORDER — ROCURONIUM BROMIDE 100 MG/10ML IV SOLN
INTRAVENOUS | Status: DC | PRN
  Administered 2012-06-17: 50 mg via INTRAVENOUS

## 2012-06-17 MED ORDER — KETOROLAC TROMETHAMINE 30 MG/ML IJ SOLN: 15.0000 mg | Freq: Once | INTRAMUSCULAR | Status: DC | PRN

## 2012-06-17 MED ORDER — CIPROFLOXACIN HCL 500 MG PO TABS: 500.0000 mg | ORAL_TABLET | Freq: Two times a day (BID) | ORAL | Status: DC

## 2012-06-17 MED ORDER — CISATRACURIUM BESYLATE (PF) 10 MG/5ML IV SOLN
INTRAVENOUS | Status: DC | PRN
  Administered 2012-06-17: 4 mg via INTRAVENOUS
  Administered 2012-06-17: 2 mg via INTRAVENOUS

## 2012-06-17 MED ORDER — NEOSTIGMINE METHYLSULFATE 1 MG/ML IJ SOLN
INTRAMUSCULAR | Status: DC | PRN
  Administered 2012-06-17: 5 mg via INTRAVENOUS

## 2012-06-17 MED ORDER — INDIGOTINDISULFONATE SODIUM 8 MG/ML IJ SOLN
INTRAMUSCULAR | Status: DC | PRN
  Administered 2012-06-17 (×2): 5 mL via INTRAVENOUS

## 2012-06-17 MED ORDER — MIDAZOLAM HCL 5 MG/5ML IJ SOLN
INTRAMUSCULAR | Status: DC | PRN
  Administered 2012-06-17: 2 mg via INTRAVENOUS

## 2012-06-17 MED ORDER — GLYCOPYRROLATE 0.2 MG/ML IJ SOLN
INTRAMUSCULAR | Status: DC | PRN
  Administered 2012-06-17: .8 mg via INTRAVENOUS

## 2012-06-17 MED ORDER — KCL IN DEXTROSE-NACL 20-5-0.45 MEQ/L-%-% IV SOLN
INTRAVENOUS | Status: DC
  Administered 2012-06-17 – 2012-06-18 (×3): via INTRAVENOUS
  Filled 2012-06-17 (×4): qty 1000

## 2012-06-17 MED ORDER — PROPOFOL 10 MG/ML IV BOLUS
INTRAVENOUS | Status: DC | PRN
  Administered 2012-06-17: 200 mg via INTRAVENOUS

## 2012-06-17 MED ORDER — SUFENTANIL CITRATE 50 MCG/ML IV SOLN
INTRAVENOUS | Status: DC | PRN
  Administered 2012-06-17 (×2): 10 ug via INTRAVENOUS
  Administered 2012-06-17: 5 ug via INTRAVENOUS
  Administered 2012-06-17: 10 ug via INTRAVENOUS
  Administered 2012-06-17: 5 ug via INTRAVENOUS
  Administered 2012-06-17: 10 ug via INTRAVENOUS

## 2012-06-17 MED ORDER — ACETAMINOPHEN 325 MG PO TABS: 650.0000 mg | ORAL_TABLET | ORAL | Status: DC | PRN

## 2012-06-17 MED ORDER — ACETAMINOPHEN 10 MG/ML IV SOLN
INTRAVENOUS | Status: AC
  Filled 2012-06-17: qty 100

## 2012-06-17 MED ORDER — SODIUM CHLORIDE 0.9 % IR SOLN
Status: DC | PRN
  Administered 2012-06-17: 1 via INTRAVESICAL

## 2012-06-17 MED ORDER — METOCLOPRAMIDE HCL 5 MG/ML IJ SOLN
INTRAMUSCULAR | Status: DC | PRN
  Administered 2012-06-17: 10 mg via INTRAVENOUS

## 2012-06-17 MED ORDER — KETOROLAC TROMETHAMINE 15 MG/ML IJ SOLN
15.0000 mg | Freq: Four times a day (QID) | INTRAMUSCULAR | Status: DC
  Administered 2012-06-17 – 2012-06-18 (×4): 15 mg via INTRAVENOUS
  Filled 2012-06-17 (×6): qty 1

## 2012-06-17 MED ORDER — HEPARIN SODIUM (PORCINE) 1000 UNIT/ML IJ SOLN
INTRAMUSCULAR | Status: DC | PRN
  Administered 2012-06-17: 1000 [IU]

## 2012-06-17 MED ORDER — HYDROMORPHONE HCL PF 1 MG/ML IJ SOLN
0.2500 mg | INTRAMUSCULAR | Status: DC | PRN
  Administered 2012-06-17 (×2): 0.5 mg via INTRAVENOUS

## 2012-06-17 MED ORDER — SODIUM CHLORIDE 0.9 % IV BOLUS (SEPSIS)
1000.0000 mL | Freq: Once | INTRAVENOUS | Status: AC
  Administered 2012-06-17: 1000 mL via INTRAVENOUS

## 2012-06-17 MED ORDER — ATROPINE SULFATE 0.4 MG/ML IJ SOLN
INTRAMUSCULAR | Status: DC | PRN
  Administered 2012-06-17: 0.2 mg via INTRAVENOUS

## 2012-06-17 MED ORDER — HYDROMORPHONE HCL PF 1 MG/ML IJ SOLN
INTRAMUSCULAR | Status: DC | PRN
  Administered 2012-06-17 (×2): 0.5 mg via INTRAVENOUS
  Administered 2012-06-17: 1 mg via INTRAVENOUS

## 2012-06-17 MED ORDER — LACTATED RINGERS IV SOLN
INTRAVENOUS | Status: DC | PRN
  Administered 2012-06-17 (×2): via INTRAVENOUS

## 2012-06-17 MED ORDER — LIDOCAINE HCL (CARDIAC) 20 MG/ML IV SOLN
INTRAVENOUS | Status: DC | PRN
  Administered 2012-06-17: 50 mg via INTRAVENOUS

## 2012-06-17 MED ORDER — BUPIVACAINE-EPINEPHRINE 0.25% -1:200000 IJ SOLN
INTRAMUSCULAR | Status: DC | PRN
  Administered 2012-06-17: 28 mL

## 2012-06-17 MED ORDER — DOCUSATE SODIUM 100 MG PO CAPS
100.0000 mg | ORAL_CAPSULE | Freq: Two times a day (BID) | ORAL | Status: DC
  Administered 2012-06-17 – 2012-06-18 (×3): 100 mg via ORAL
  Filled 2012-06-17 (×4): qty 1

## 2012-06-17 MED ORDER — CEFAZOLIN SODIUM 1-5 GM-% IV SOLN
1.0000 g | Freq: Three times a day (TID) | INTRAVENOUS | Status: AC
Start: 2012-06-17 — End: 2012-06-17
  Administered 2012-06-17 (×2): 1 g via INTRAVENOUS
  Filled 2012-06-17 (×2): qty 50

## 2012-06-17 MED ORDER — HYDROMORPHONE HCL PF 1 MG/ML IJ SOLN
INTRAMUSCULAR | Status: AC
  Filled 2012-06-17: qty 1

## 2012-06-17 MED ORDER — CEFAZOLIN SODIUM-DEXTROSE 2-3 GM-% IV SOLR
INTRAVENOUS | Status: AC
  Filled 2012-06-17: qty 50

## 2012-06-17 MED ORDER — HYDROCODONE-ACETAMINOPHEN 5-325 MG PO TABS: 1.0000 | ORAL_TABLET | Freq: Four times a day (QID) | ORAL | Status: DC | PRN

## 2012-06-17 MED ORDER — PROMETHAZINE HCL 25 MG/ML IJ SOLN: 6.2500 mg | INTRAMUSCULAR | Status: DC | PRN

## 2012-06-17 SURGICAL SUPPLY — 38 items
CANISTER SUCTION 2500CC (MISCELLANEOUS) ×3 IMPLANT
CATH ROBINSON RED A/P 8FR (CATHETERS) ×3 IMPLANT
CHLORAPREP W/TINT 26ML (MISCELLANEOUS) ×3 IMPLANT
CLIP LIGATING HEM O LOK PURPLE (MISCELLANEOUS) ×15 IMPLANT
CLOTH BEACON ORANGE TIMEOUT ST (SAFETY) ×3 IMPLANT
CORD HIGH FREQUENCY UNIPOLAR (ELECTROSURGICAL) ×3 IMPLANT
COVER SURGICAL LIGHT HANDLE (MISCELLANEOUS) ×3 IMPLANT
COVER TIP SHEARS 8 DVNC (MISCELLANEOUS) ×2 IMPLANT
COVER TIP SHEARS 8MM DA VINCI (MISCELLANEOUS) ×1
CUTTER ECHEON FLEX ENDO 45 340 (ENDOMECHANICALS) ×3 IMPLANT
DECANTER SPIKE VIAL GLASS SM (MISCELLANEOUS) ×3 IMPLANT
DRAPE SURG IRRIG POUCH 19X23 (DRAPES) ×3 IMPLANT
DRSG TEGADERM 2-3/8X2-3/4 SM (GAUZE/BANDAGES/DRESSINGS) ×15 IMPLANT
DRSG TEGADERM 4X4.75 (GAUZE/BANDAGES/DRESSINGS) IMPLANT
DRSG TEGADERM 6X8 (GAUZE/BANDAGES/DRESSINGS) ×6 IMPLANT
ELECT REM PT RETURN 9FT ADLT (ELECTROSURGICAL) ×3
ELECTRODE REM PT RTRN 9FT ADLT (ELECTROSURGICAL) ×2 IMPLANT
GLOVE BIO SURGEON STRL SZ 6.5 (GLOVE) ×3 IMPLANT
GLOVE BIOGEL M STRL SZ7.5 (GLOVE) ×12 IMPLANT
GOWN STRL NON-REIN LRG LVL3 (GOWN DISPOSABLE) ×9 IMPLANT
GOWN STRL REIN XL XLG (GOWN DISPOSABLE) ×6 IMPLANT
HOLDER FOLEY CATH W/STRAP (MISCELLANEOUS) ×3 IMPLANT
IV LACTATED RINGERS 1000ML (IV SOLUTION) ×3 IMPLANT
KIT ACCESSORY DA VINCI DISP (KITS) ×1
KIT ACCESSORY DVNC DISP (KITS) ×2 IMPLANT
NDL SAFETY ECLIPSE 18X1.5 (NEEDLE) ×2 IMPLANT
NEEDLE HYPO 18GX1.5 SHARP (NEEDLE) ×3
PACK ROBOT UROLOGY CUSTOM (CUSTOM PROCEDURE TRAY) ×3 IMPLANT
RELOAD GREEN ECHELON 45 (STAPLE) ×3 IMPLANT
SET TUBE IRRIG SUCTION NO TIP (IRRIGATION / IRRIGATOR) ×3 IMPLANT
SOLUTION ELECTROLUBE (MISCELLANEOUS) ×3 IMPLANT
SPONGE GAUZE 4X4 12PLY (GAUZE/BANDAGES/DRESSINGS) ×3 IMPLANT
SUT ETHILON 3 0 PS 1 (SUTURE) ×3 IMPLANT
SUT VICRYL 0 UR6 27IN ABS (SUTURE) ×6 IMPLANT
SYR 27GX1/2 1ML LL SAFETY (SYRINGE) ×3 IMPLANT
TOWEL OR 17X26 10 PK STRL BLUE (TOWEL DISPOSABLE) ×3 IMPLANT
TOWEL OR NON WOVEN STRL DISP B (DISPOSABLE) ×3 IMPLANT
WATER STERILE IRR 1500ML POUR (IV SOLUTION) ×6 IMPLANT

## 2012-06-17 NOTE — Progress Notes (Signed)
Patient ID: David Smith, male   DOB: 1956-02-27, 57 y.o.   MRN: [id number]  Post-op note  Subjective: The patient is doing well.  No complaints.  Objective: Vital signs in last 24 hours: Temp:  [98.3 F (36.8 C)-98.7 F (37.1 C)] 98.6 F (37 C) (01/13 1230) Pulse Rate:  [55-78] 55  (01/13 1230) Resp:  [10-20] 16  (01/13 1230) BP: (122-161)/(69-105) 128/69 mmHg (01/13 1230) SpO2:  [97 %-100 %] 99 % (01/13 1230) Weight:  [103.1 kg (227 lb 4.7 oz)] 103.1 kg (227 lb 4.7 oz) (01/13 1230)  Intake/Output from previous day:   Intake/Output this shift: Total I/O In: 2500 [I.V.:1500; IV Piggyback:1000] Out: 720 [Urine:300; Drains:170; Blood:250]  Physical Exam:  General: Alert and oriented. Abdomen: Soft, Nondistended. Incisions: Clean and dry. Urine: Light pink  Lab Results:  Basename 06/17/12 1036  HGB 12.7*  HCT 38.6*    Assessment/Plan: POD#0   1) Continue to monitor   David Smith. MD   LOS: 0 days   David Smith,David Smith 06/17/2012, 5:45 PM

## 2012-06-17 NOTE — Anesthesia Postprocedure Evaluation (Signed)
  Anesthesia Post-op Note  Patient: David Smith  Procedure(s) Performed: Procedure(s) (LRB): ROBOTIC ASSISTED LAPAROSCOPIC RADICAL PROSTATECTOMY LEVEL 2 (N/A) LYMPHADENECTOMY (Bilateral)  Patient Location: PACU  Anesthesia Type: General  Level of Consciousness: awake and alert   Airway and Oxygen Therapy: Patient Spontanous Breathing  Post-op Pain: mild  Post-op Assessment: Post-op Vital signs reviewed, Patient's Cardiovascular Status Stable, Respiratory Function Stable, Patent Airway and No signs of Nausea or vomiting  Last Vitals:  Filed Vitals:   06/17/12 1015  BP: 154/86  Pulse: 70  Temp: 36.8 C  Resp: 13    Post-op Vital Signs: stable   Complications: No apparent anesthesia complications

## 2012-06-17 NOTE — Op Note (Addendum)
Preoperative diagnosis: Clinically localized adenocarcinoma of the prostate (clinical stage cT2a N0 M0)  Postoperative diagnosis: Clinically localized adenocarcinoma of the prostate (clinical stage cT1c N0 M0)  Procedure:  1. Robotic assisted laparoscopic radical prostatectomy (bilateral nerve sparing) 2. Bilateral robotic assisted laparoscopic pelvic lymphadenectomy  Surgeon: [name]. M.D.  Assistant: Pecola Leisure, PA-C  Anesthesia: General  Complications: None  EBL: 250 mL  IVF:  1500 mL crystalloid  Specimens: 1. Prostate and seminal vesicles 2. Right pelvic lymph nodes 3. Left pelvic lymph nodes  Disposition of specimens: Pathology  Drains: 1. 20 Fr coude catheter 2. # 19 Blake pelvic drain  Indication: David Smith is a 57 y.o. year old patient with clinically localized prostate cancer.  After a thorough review of the management options for treatment of prostate cancer, he elected to proceed with surgical therapy and the above procedure(s).  We have discussed the potential benefits and risks of the procedure, side effects of the proposed treatment, the likelihood of the patient achieving the goals of the procedure, and any potential problems that might occur during the procedure or recuperation. Informed consent has been obtained.  Description of procedure:  The patient was taken to the operating room and a general anesthetic was administered. He was given preoperative antibiotics, placed in the dorsal lithotomy position, and prepped and draped in the usual sterile fashion. Next a preoperative timeout was performed. A urethral catheter was placed into the bladder and a site was selected near the umbilicus for placement of the camera port. This was placed using a standard open Hassan technique which allowed entry into the peritoneal cavity under direct vision and without difficulty. A 12 mm port was placed and a pneumoperitoneum established. The camera was  then used to inspect the abdomen and there was no evidence of any intra-abdominal injuries or other abnormalities. The remaining abdominal ports were then placed. 8 mm robotic ports were placed in the right lower quadrant, left lower quadrant, and far left lateral abdominal wall. A 5 mm port was placed in the right upper quadrant and a 12 mm port was placed in the right lateral abdominal wall for laparoscopic assistance. All ports were placed under direct vision without difficulty. The surgical cart was then docked.   Utilizing the cautery scissors, the bladder was reflected posteriorly allowing entry into the space of Retzius and identification of the endopelvic fascia and prostate. The periprostatic fat was then removed from the prostate allowing full exposure of the endopelvic fascia. The endopelvic fascia was then incised from the apex back to the base of the prostate bilaterally and the underlying levator muscle fibers were swept laterally off the prostate thereby isolating the dorsal venous complex. The dorsal vein was then stapled and divided with a 45 mm Flex Echelon stapler. Attention then turned to the bladder neck which was divided anteriorly thereby allowing entry into the bladder and exposure of the urethral catheter. The catheter balloon was deflated and the catheter was brought into the operative field and used to retract the prostate anteriorly. The posterior bladder neck was then examined and was divided allowing further dissection between the bladder and prostate posteriorly until the vasa deferentia and seminal vessels were identified. The vasa deferentia were isolated, divided, and lifted anteriorly. The seminal vesicles were dissected down to their tips with care to control the seminal vascular arterial blood supply. These structures were then lifted anteriorly and the space between Denonvillier's fascia and the anterior rectum was developed with a combination  of sharp and blunt dissection.  This isolated the vascular pedicles of the prostate.  The lateral prostatic fascia was then sharply incised allowing release of the neurovascular bundle on the right side. A wide non-nerve sparing dissection was performed on the left side. The vascular pedicles of the prostate were then ligated with Weck clips between the prostate and neurovascular bundle on the right and widely on the left and divided with sharp cold scissor dissection resulting in neurovascular bundle preservation. The neurovascular bundle was then separated off the apex of the prostate and urethra on the right side.  The urethra was then sharply transected allowing the prostate specimen to be disarticulated. The pelvis was copiously irrigated and hemostasis was ensured. There was no evidence for rectal injury.  Attention then turned to the right pelvic sidewall. The fibrofatty tissue between the external iliac vein, confluence of the iliac vessels, hypogastric artery, and Cooper's ligament was dissected free from the pelvic sidewall with care to preserve the obturator nerve. Weck clips were used for lymphostasis and hemostasis. An identical procedure was performed on the contralateral side and the lymphatic packets were removed for permanent pathologic analysis.  Attention then turned to the urethral anastomosis. A 2-0 Vicryl slip knot was placed between Denonvillier's fascia, the posterior bladder neck, and the posterior urethra to reapproximate these structures. A double-armed 3-0 Monocryl suture was then used to perform a 360 running tension-free anastomosis between the bladder neck and urethra. A new urethral catheter was then placed into the bladder and irrigated. There were no blood clots within the bladder and the anastomosis appeared to be watertight. A #19 Blake drain was then brought through the left lateral 8 mm port site and positioned appropriately within the pelvis. It was secured to the skin with a nylon suture. The  surgical cart was then undocked. The right lateral 12 mm port site was closed at the fascial level with a 0 Vicryl suture placed laparoscopically. All remaining ports were then removed under direct vision. The prostate specimen was removed intact within the Endopouch retrieval bag via the periumbilical camera port site. This fascial opening was closed with two running 0 Vicryl sutures. 0.25% Marcaine was then injected into all port sites and all incisions were reapproximated at the skin level with staples. Sterile dressings were applied. The patient appeared to tolerate the procedure well and without complications. The patient was able to be extubated and transferred to the recovery unit in satisfactory condition.   [name] MD

## 2012-06-17 NOTE — Transfer of Care (Signed)
Immediate Anesthesia Transfer of Care Note  Patient: David Smith  Procedure(s) Performed: Procedure(s) (LRB) with comments: ROBOTIC ASSISTED LAPAROSCOPIC RADICAL PROSTATECTOMY LEVEL 2 (N/A) -   LYMPHADENECTOMY (Bilateral)  Patient Location: PACU  Anesthesia Type:General  Level of Consciousness: awake, oriented and patient cooperative  Airway & Oxygen Therapy: Patient Spontanous Breathing and Patient connected to face mask oxygen  Post-op Assessment: Report given to PACU RN, Post -op Vital signs reviewed and stable and Patient moving all extremities  Post vital signs: Reviewed and stable  Complications: No apparent anesthesia complications

## 2012-06-17 NOTE — Anesthesia Preprocedure Evaluation (Signed)
Anesthesia Evaluation  Patient identified by MRN, date of birth, ID band Patient awake    Reviewed: Allergy & Precautions, H&P , NPO status , Patient's Chart, lab work & pertinent test results  Airway Mallampati: II TM Distance: >3 FB Neck ROM: Full    Dental No notable dental hx.    Pulmonary neg pulmonary ROS,  breath sounds clear to auscultation  Pulmonary exam normal       Cardiovascular hypertension, Pt. on medications Rhythm:Regular Rate:Normal     Neuro/Psych negative neurological ROS  negative psych ROS   GI/Hepatic negative GI ROS, Neg liver ROS,   Endo/Other  negative endocrine ROS  Renal/GU negative Renal ROS  negative genitourinary   Musculoskeletal negative musculoskeletal ROS (+)   Abdominal   Peds negative pediatric ROS (+)  Hematology negative hematology ROS (+)   Anesthesia Other Findings   Reproductive/Obstetrics negative OB ROS                           Anesthesia Physical Anesthesia Plan  ASA: II  Anesthesia Plan: General   Post-op Pain Management:    Induction: Intravenous  Airway Management Planned: Oral ETT  Additional Equipment:   Intra-op Plan:   Post-operative Plan: Extubation in OR  Informed Consent: I have reviewed the patients History and Physical, chart, labs and discussed the procedure including the risks, benefits and alternatives for the proposed anesthesia with the patient or authorized representative who has indicated his/her understanding and acceptance.   Dental advisory given  Plan Discussed with: CRNA and Surgeon  Anesthesia Plan Comments:         Anesthesia Quick Evaluation  

## 2012-06-18 ENCOUNTER — Encounter (HOSPITAL_COMMUNITY): Payer: Self-pay | Admitting: Urology

## 2012-06-18 LAB — HEMOGLOBIN AND HEMATOCRIT, BLOOD
HCT: 35.8 % — ABNORMAL LOW (ref 39.0–52.0)
Hemoglobin: 11.8 g/dL — ABNORMAL LOW (ref 13.0–17.0)

## 2012-06-18 MED ORDER — BISACODYL 10 MG RE SUPP
10.0000 mg | Freq: Once | RECTAL | Status: AC
  Administered 2012-06-18: 10 mg via RECTAL
  Filled 2012-06-18: qty 1

## 2012-06-18 MED ORDER — HYDROCODONE-ACETAMINOPHEN 5-325 MG PO TABS: 1.0000 | ORAL_TABLET | Freq: Four times a day (QID) | ORAL | Status: DC | PRN

## 2012-06-18 NOTE — Progress Notes (Signed)
Patient ID: David Smith, male   DOB: February 03, 1956, 57 y.o.   MRN: [id number]  1 Day Post-Op Subjective: The patient is doing well.  No nausea or vomiting. Pain is adequately controlled.  Objective: Vital signs in last 24 hours: Temp:  [98.1 F (36.7 C)-99.4 F (37.4 C)] 99.1 F (37.3 C) (01/14 0503) Pulse Rate:  [55-78] 65  (01/14 0503) Resp:  [10-18] 14  (01/14 0503) BP: (117-145)/(67-82) 117/82 mmHg (01/14 0503) SpO2:  [97 %-100 %] 97 % (01/14 0503) Weight:  [103.1 kg (227 lb 4.7 oz)] 103.1 kg (227 lb 4.7 oz) (01/13 1230)  Intake/Output from previous day: 01/13 0701 - 01/14 0700 In: 5457.5 [P.O.:240; I.V.:4117.5; IV Piggyback:1100] Out: 3005 [Urine:2375; Drains:380; Blood:250] Intake/Output this shift: Total I/O In: -  Out: 725 [Urine:675; Drains:50]  Physical Exam:  General: Alert and oriented. CV: RRR Lungs: Clear bilaterally. GI: Soft, Nondistended. Incisions: Dressings intact. Urine: Clear Extremities: Nontender, no erythema, no edema.  Lab Results:  Basename 06/18/12 0518 06/17/12 1036  HGB 11.8* 12.7*  HCT 35.8* 38.6*      Assessment/Plan: POD# 1 s/p robotic prostatectomy.  1) SL IVF 2) Ambulate, Incentive spirometry 3) Transition to oral pain medication 4) Dulcolax suppository 5) D/C pelvic drain 6) Plan for likely discharge later today   [name]. MD   LOS: 1 day   Laquanda Bick,LES 06/18/2012, 10:25 AM

## 2012-06-18 NOTE — Discharge Summary (Signed)
  Date of admission: 06/17/2012  Date of discharge: 06/18/2012  Admission diagnosis: Prostate Cancer  Discharge diagnosis: Prostate Cancer  History and Physical: For full details, please see admission history and physical. Briefly, David Smith is a 57 y.o. gentleman with localized prostate cancer.  After discussing management/treatment options, he elected to proceed with surgical treatment.  Hospital Course: JEF FUTCH was taken to the operating room on 06/17/2012 and underwent a robotic assisted laparoscopic radical prostatectomy. He tolerated this procedure well and without complications. Postoperatively, he was able to be transferred to a regular hospital room following recovery from anesthesia.  He was able to begin ambulating the night of surgery. He remained hemodynamically stable overnight.  He had excellent urine output with appropriately minimal output from his pelvic drain and his pelvic drain was removed on POD #1.  He was transitioned to oral pain medication, tolerated a clear liquid diet, and had met all discharge criteria and was able to be discharged home later on POD#1.  Laboratory values:  Basename 06/18/12 0518 06/17/12 1036  HGB 11.8* 12.7*  HCT 35.8* 38.6*    Disposition: Home  Discharge instruction: He was instructed to be ambulatory but to refrain from heavy lifting, strenuous activity, or driving. He was instructed on urethral catheter care.  Discharge medications:     Medication List     As of 06/18/2012 11:04 AM    START taking these medications         ciprofloxacin 500 MG tablet   Commonly known as: CIPRO   Take 1 tablet (500 mg total) by mouth 2 (two) times daily. Start day prior to office visit for foley removal      HYDROcodone-acetaminophen 5-325 MG per tablet   Commonly known as: NORCO/VICODIN   Take 1-2 tablets by mouth every 6 (six) hours as needed for pain.      CONTINUE taking these medications         acetaminophen 325 MG tablet   Commonly known as: TYLENOL      * lisinopril-hydrochlorothiazide 20-12.5 MG per tablet   Commonly known as: PRINZIDE,ZESTORETIC      * lisinopril-hydrochlorothiazide 20-12.5 MG per tablet   Commonly known as: PRINZIDE,ZESTORETIC   TAKE TWO TABLETS BY MOUTH EVERY DAY     * Notice: This list has 2 medication(s) that are the same as other medications prescribed for you. Read the directions carefully, and ask your doctor or other care provider to review them with you.    STOP taking these medications         sildenafil 100 MG tablet   Commonly known as: VIAGRA          Where to get your medications    These are the prescriptions that you need to pick up.   You may get these medications from any pharmacy.         ciprofloxacin 500 MG tablet   HYDROcodone-acetaminophen 5-325 MG per tablet            Followup: He will followup in 1 week for catheter removal and to discuss his surgical pathology results.

## 2012-09-09 ENCOUNTER — Ambulatory Visit: Payer: Self-pay

## 2012-09-09 ENCOUNTER — Ambulatory Visit: Admission: RE | Admit: 2012-09-09 | Payer: Self-pay | Source: Ambulatory Visit | Admitting: Radiation Oncology

## 2012-10-04 ENCOUNTER — Encounter: Payer: Self-pay | Admitting: Radiation Oncology

## 2012-10-04 NOTE — Progress Notes (Signed)
GU Location of Tumor / Histology: T2a adenocarcinoma of the prostate  If Prostate Cancer, Gleason Score is (4+3) and PSA is (34.25)  Patient presented Summer 2013 months ago with signs/symptoms of: elevated PSA of 34.3 during routine physical exam  Biopsies of prstate (if applicable) revealed: 02/06/2012  Past/Anticipated interventions by urology, if any: S/P radical resection/prostatectomy done 06/17/2012  Past/Anticipated interventions by medical oncology, if any: N/A  Weight changes, if any: None noted  Bowel/Bladder complaints, if any: IPSS 6 on original presurgical consult done 06/17/12 with minimal lower urinary tract symptoms   Nausea/Vomiting, if any: None noted  Pain issues, if any:  None noted  SAFETY ISSUES:  Prior radiation? NO  Pacemaker/ICD? NO  Possible current pregnancy? NO  Is the patient on methotrexate? NO  Current Complaints / other details:  57 year old male. Significant immediate family hx of cancer. NKDA.

## 2012-10-06 ENCOUNTER — Encounter: Payer: Self-pay | Admitting: Radiation Oncology

## 2012-10-06 NOTE — Progress Notes (Signed)
Radiation Oncology         [phone]) [phone] ________________________________  Name: David Smith MRN: [id number]  Date: 10/07/2012  DOB: 08/27/1955  Follow-Up Visit Note  CC: Willow Ora, MD  Crecencio Mc, MD  Diagnosis:   57 y.o. gentleman with stage T3 adenocarcinoma of the prostate with a Gleason's score of 4+3 and a PSA of 34.25 status post robotic-assisted laparoscopic radical prostatectomy 06/17/2012 with focally positive margins and focal extracapsular extension and postoperative PSA of 0.12  Narrative:  The patient returns today for routine follow-up.  I met with him in consultation on 06/03/2012. At that time, patient had been diagnosed with localized prostate cancer.  He was leaning toward prostatectomy and was seen for a second opinion regarding radiation treatment. Ultimately, he elects to pursue prostatectomy which was performed on 06/17/2012. His pathology report showed the following findings:                           Diagnosis  1. Prostate, radical resection   - PROSTATIC ADENOCARCINOMA, GLEASON'S SCORE 4+3=7 INVOLVING BOTH LOBES.   - FOCAL LEFT APEX AND POSTERIOR LATERAL MARGIN INVOLVEMENT.   - FOCAL EXTRACAPSULAR EXTENSION.   - RIGHT AND LEFT SEMINAL VESICLES FREE OF TUMOR.  2. Lymph nodes, regional resection, right pelvic   - FOUR BENIGN LYMPH NODES (0/4).a  3. Lymph nodes, regional resection, left pelvic   - FIVE BENIGN LYMPH NODES (0/5).  Postoperatively, the patient has recovered uneventfully. His PSA on 08/15/2012 was 0.12.   ALLERGIES:  is allergic to other.  Meds: Current Outpatient Prescriptions  Medication Sig Dispense Refill  . acetaminophen (TYLENOL) 325 MG tablet Take 650 mg by mouth every 6 (six) hours as needed. Pain      . ciprofloxacin (CIPRO) 500 MG tablet Take 1 tablet (500 mg total) by mouth 2 (two) times daily. Start day prior to office visit for foley removal  6 tablet  0  . HYDROcodone-acetaminophen (NORCO) 5-325 MG per tablet Take 1-2 tablets by  mouth every 6 (six) hours as needed for pain.  30 tablet  0  . lisinopril-hydrochlorothiazide (PRINZIDE,ZESTORETIC) 20-12.5 MG per tablet Take 2 tablets by mouth daily before breakfast.      . lisinopril-hydrochlorothiazide (PRINZIDE,ZESTORETIC) 20-12.5 MG per tablet TAKE TWO TABLETS BY MOUTH EVERY DAY  180 tablet  1   No current facility-administered medications for this encounter.    Physical Findings: The patient is in no acute distress. Patient is alert and oriented.  vitals were not taken for this visit..  No significant changes.  Lab Findings: Lab Results  Component Value Date   WBC 6.7 06/11/2012   HGB 11.8* 06/18/2012   HCT 35.8* 06/18/2012   MCV 81.4 06/11/2012   PLT 191 06/11/2012    @LASTCHEM @  Radiographic Findings: No results found.  Impression:  The patient is status post prostatectomy with some high-risk pathologic features as well as detectable PSA. He may benefit from salvage prostatic fossa radiotherapy.  Plan:  Today, I talked to the patient and family about the findings and work-up thus far.  We discussed the natural history of disease and general treatment, highlighting the role or radiotherapy in the management.  We discussed the available radiation techniques, and focused on the details of logistics and delivery.  We reviewed the anticipated acute and late sequelae associated with radiation in this setting.    The patient would like to proceed with PSA on 5/15 before deciding.  I spent 60 minutes minutes face to face with the patient and more than 50% of that time was spent in counseling and/or coordination of care.  _____________________________________  Artist Pais. Kathrynn Running, M.D.

## 2012-10-07 ENCOUNTER — Ambulatory Visit
Admission: RE | Admit: 2012-10-07 | Discharge: 2012-10-07 | Disposition: A | Discharge status: Home / Self Care | Payer: Medicaid Other | Source: Ambulatory Visit | Attending: Radiation Oncology | Admitting: Radiation Oncology

## 2012-10-07 ENCOUNTER — Encounter: Payer: Self-pay | Admitting: Radiation Oncology

## 2012-10-07 VITALS — BP 136/80 | HR 68 | Temp 98.2°F | Resp 16 | Ht 73.0 in | Wt 234.7 lb

## 2012-10-07 DIAGNOSIS — C61 Malignant neoplasm of prostate: Secondary | ICD-10-CM | POA: Insufficient documentation

## 2012-10-07 DIAGNOSIS — Z9079 Acquired absence of other genital organ(s): Secondary | ICD-10-CM | POA: Insufficient documentation

## 2012-10-07 NOTE — Progress Notes (Signed)
Complete PATIENT MEASURE OF DISTRESS worksheet with a score of 3.5 submitted to social work.

## 2012-10-07 NOTE — Progress Notes (Signed)
Patient presented to the clinic today unaccompanied for follow up new consult with Dr. Kathrynn Running reference prostate ca. Patient alert and oriented to person, place, and time. No distress noted. Steady gait noted. Pleasant affect noted. Patient reports low back and right hip pain x2 years but, reports that physicians have related this to arthritis. Patient reports that rest and tylenol help to relieve this pain. Patient reports incontinence is greatly improved. Patient reports only leakage when when he is spooked, sneezes or laughs. Patient denies that he has to get up during the night to void but, does reports that he promptly has to go once getting up each morning. Patient denies burning with urination. Patient denies hematuria. Patient denies diarrhea. Also, patient denies burning or pain with bowel movement. Patient reports bowel urgency. Patient describes his urine stream is strong initially but, at the end of the movement it lowly leaks. Patient reports that his last PSA was 0.12 in mid February. Patient scheduled for labs on 10/17/2012 with follow up being on 10/22/12. Reported all findings to Dr. Kathrynn Running.   IPSS 7 up from 6 on initial consult

## 2012-10-07 NOTE — Progress Notes (Signed)
See progress note under physician encounter. 

## 2012-12-26 ENCOUNTER — Encounter: Payer: Self-pay | Admitting: Internal Medicine

## 2012-12-26 ENCOUNTER — Ambulatory Visit (INDEPENDENT_AMBULATORY_CARE_PROVIDER_SITE_OTHER): Payer: Self-pay | Admitting: Internal Medicine

## 2012-12-26 VITALS — BP 130/80 | HR 62 | Temp 98.2°F | Wt 242.8 lb

## 2012-12-26 DIAGNOSIS — M549 Dorsalgia, unspecified: Secondary | ICD-10-CM

## 2012-12-26 LAB — CBC WITH DIFFERENTIAL/PLATELET
Basophils Absolute: 0 10*3/uL (ref 0.0–0.1)
Basophils Relative: 0.2 % (ref 0.0–3.0)
Eosinophils Absolute: 0.3 10*3/uL (ref 0.0–0.7)
Eosinophils Relative: 4.4 % (ref 0.0–5.0)
HCT: 41 % (ref 39.0–52.0)
Hemoglobin: 13.5 g/dL (ref 13.0–17.0)
Lymphocytes Relative: 23.7 % (ref 12.0–46.0)
Lymphs Abs: 1.5 10*3/uL (ref 0.7–4.0)
MCHC: 32.9 g/dL (ref 30.0–36.0)
MCV: 82.4 fl (ref 78.0–100.0)
Monocytes Absolute: 0.3 10*3/uL (ref 0.1–1.0)
Monocytes Relative: 5.2 % (ref 3.0–12.0)
Neutro Abs: 4.2 10*3/uL (ref 1.4–7.7)
Neutrophils Relative %: 66.5 % (ref 43.0–77.0)
Platelets: 175 10*3/uL (ref 150.0–400.0)
RBC: 4.98 Mil/uL (ref 4.22–5.81)
RDW: 14.8 % — ABNORMAL HIGH (ref 11.5–14.6)
WBC: 6.3 10*3/uL (ref 4.5–10.5)

## 2012-12-26 LAB — BASIC METABOLIC PANEL
BUN: 18 mg/dL (ref 6–23)
CO2: 26 mEq/L (ref 19–32)
Calcium: 8.9 mg/dL (ref 8.4–10.5)
Chloride: 107 mEq/L (ref 96–112)
Creatinine, Ser: 1.5 mg/dL (ref 0.4–1.5)
GFR: 62.98 mL/min (ref >60.00)
Glucose, Bld: 95 mg/dL (ref 70–99)
Potassium: 3.5 mEq/L (ref 3.5–5.1)
Sodium: 140 mEq/L (ref 135–145)

## 2012-12-26 LAB — FOLATE: Folate: 14.5 ng/mL (ref >5.9)

## 2012-12-26 LAB — VITAMIN B12: Vitamin B-12: 318 pg/mL (ref 211–911)

## 2012-12-26 NOTE — Patient Instructions (Addendum)
We are scheduling a back MRI. If you are getting worse, have fever chills,  have difficulty controlling your bowels or bladder--> call us immediately Please arrange for a followup visit in one month

## 2012-12-26 NOTE — Progress Notes (Signed)
Subjective:    Patient ID: David Smith, male    DOB: 1955/12/24, 57 y.o.   MRN: [id number]  HPI Acute visit For the last 2 or 3 days he is having leg pain with ambulation. Left  Leg pain is in the whole leg; R leg pain goes  up to the knee only ; sx happen after he walks one or 2 blocks, then immediately goes away when he sits down; the pain  is described as a numbness in front of the legs and as a ache at  the posterior aspect of each leg. He has chronic back pain, with normal routine the pain is 2/10, with exertion is 7/10, the pain has been  slightly worse than his baseline in the last few days.  Also, since the last time he was here he was diagnosed with prostate cancer, see past medical history.  Past Medical History  Diagnosis Date  . Prostate cancer 02/06/12    gleason 4+3=7, volume 56.4 cc, PSA 34.25  . Heart murmur   . Hypertension   . Chronic lower back pain 06-11-12    daily a problem Pain level 7, Tylenol helpful(down to level 2)  . Refusal of blood transfusions as patient is [employer] 06-11-12    Pt. desires to sign refusal to permit blood products with exception-to sign 06-17-12   Past Surgical History  Procedure Laterality Date  . Achilles tendon repair      26 yrs ago, right foot  . Anal fissurectomy  2008  . Dental procedure  06-11-12    minor -office procedure to correct jaw issue-used anesthesia gas to do  . Robot assisted laparoscopic radical prostatectomy  06/17/2012    Procedure: ROBOTIC ASSISTED LAPAROSCOPIC RADICAL PROSTATECTOMY LEVEL 2;  Surgeon: Crecencio Mc, MD;  Location: [facility];  Service: Urology;  Laterality: N/A;     . Lymphadenectomy  06/17/2012    Procedure: LYMPHADENECTOMY;  Surgeon: Crecencio Mc, MD;  Location: [facility];  Service: Urology;  Laterality: Bilateral;  . Prostate biopsy       Review of Systems Denies fever or chills. No recent injury. No bowel incontinence. Occasionally urinary leak since the surgery but is getting better. Denies any  rash on the back or leg lately. No chest pain, shortness or breath or palpitations. Denies paresthesias at the toes, no perineal numbness     Objective:   Physical Exam BP 130/80  Pulse 62  Temp(Src) 98.2 F (36.8 C) (Oral)  Wt 242 lb 12.8 oz (110.133 kg)  BMI 32.04 kg/m2  SpO2 97% General -- alert, well-developed, NAD .    HEENT -- Tnot pale Lungs -- normal respiratory effort, no intercostal retractions, no accessory muscle use, and normal breath sounds.   Heart-- normal rate, regular rhythm, no murmur, and no gallop.   Abdomen--soft, non-tender, no distention, no bruit  Extremities-- no pretibial edema bilaterally, normal femoral-pedal pulses B, feet warm, good cap refills   Neurologic-- alert & oriented X3 ; Gait normal, not antalgic. DTRs symmetric, pinprick examination of the lower extremities normal. Straight leg test negative ; back nontender to palpation.Marland Kitchen Psych-- Cognition and judgment appear intact. Alert and cooperative with normal attention span and concentration.  not anxious appearing and not depressed appearing.           Assessment & Plan:   Lower extremity pain and chronic back pain. 57 year old gentleman with chronic back pain, recent diagnosis of prostate cancer (PSA not zero according to the patient) who presents  with lower extremity pain with ambulation and exacerbation of chronic back pain. I reviewed the chart, Pet scan 02-2012:   IMPRESSION:  1. No convincing evidence of skeletal metastasis.  2. One lesion within the pedicle of the T11 vertebral body warrants attention on follow-up. If high clinical concern for metastasis, and limited MRI of the lower thoracic spine could be performed.  Differential diagnosis includes a musculoskeletal issue ( radiculopathy, metastatic disease, spinal stenosis,others) or  vascular insufficiency ( noting a  completely normal vascular exam) .  Plan: MRI of the back, if (-) consider ABIs Check BMP, B12-filoc acid  Ortho  ref

## 2012-12-30 ENCOUNTER — Telehealth: Payer: Self-pay | Admitting: Internal Medicine

## 2012-12-30 ENCOUNTER — Ambulatory Visit
Admission: RE | Admit: 2012-12-30 | Discharge: 2012-12-30 | Disposition: A | Discharge status: Home / Self Care | Payer: No Typology Code available for payment source | Source: Ambulatory Visit | Attending: Internal Medicine | Admitting: Internal Medicine

## 2012-12-30 DIAGNOSIS — M549 Dorsalgia, unspecified: Secondary | ICD-10-CM

## 2012-12-30 NOTE — Telephone Encounter (Addendum)
Advise patient, MRI show a disk problem @ L1-2. Plan is the same needs to see ortho.  Please check with David Smith, he needs to be seen this week. Also, blood work normal

## 2012-12-31 NOTE — Telephone Encounter (Signed)
Discussed with patient, verbalized understanding. Pt. States he is going to need to reschedule appointment with ortho from tomorrow to next week. Encouraged to go to orthopedic doctor per recommendations Dr. Drue Novel.

## 2013-01-24 ENCOUNTER — Ambulatory Visit: Payer: Medicaid Other | Admitting: Internal Medicine

## 2013-03-03 ENCOUNTER — Encounter: Payer: Self-pay | Admitting: Radiation Oncology

## 2013-03-03 NOTE — Progress Notes (Signed)
GU Location of Tumor / Histology: T2a adenocarcinoma of the prostate  If Prostate Cancer, Gleason Score is (4 + 3) and PSA is (34.25)  Patient presented Summer 2013 with signs/symptoms of elevated PSA.  Biopsies of prostate (if applicable) revealed:     Past/Anticipated interventions by urology, if any: /s.p radical resection/prostatectomy done 06/17/2012  Past/Anticipated interventions by medical oncology, if any: None  Weight changes, if any: None noted  Bowel/Bladder complaints, if any: fairly continent but, does wear 1 pad per day for protection; nocturnal incontinence periodically   Nausea/Vomiting, if any: None noted  Pain issues, if any:  Left leg pain related to effects of herniated disc  SAFETY ISSUES:  Prior radiation? No  Pacemaker/ICD? No  Possible current pregnancy? No  Is the patient on methotrexate? No  Current Complaints / other details:  57 year old male. NKDA. Paternal history of prostate cancer.   Dr. Laverle Patter referring patient back to Dr. Kathrynn Running approximately 8 months out from his radical prostatectomy. Dr. Laverle Patter strongly recommending the patient proceed with adjuvant radiation therapy instead of just close PSA monitoring like he has been doing. PSA on 01/23/2013 was 0.19 up from 0.12 on 10/17/2012.

## 2013-03-05 ENCOUNTER — Ambulatory Visit
Admission: RE | Admit: 2013-03-05 | Discharge: 2013-03-05 | Disposition: A | Discharge status: Home / Self Care | Payer: Medicaid Other | Source: Ambulatory Visit | Attending: Radiation Oncology | Admitting: Radiation Oncology

## 2013-03-05 ENCOUNTER — Encounter: Payer: Self-pay | Admitting: Radiation Oncology

## 2013-03-05 VITALS — BP 147/89 | HR 56 | Temp 98.4°F | Resp 16 | Ht 72.0 in | Wt 244.8 lb

## 2013-03-05 DIAGNOSIS — M51379 Other intervertebral disc degeneration, lumbosacral region without mention of lumbar back pain or lower extremity pain: Secondary | ICD-10-CM | POA: Insufficient documentation

## 2013-03-05 DIAGNOSIS — Z9079 Acquired absence of other genital organ(s): Secondary | ICD-10-CM | POA: Insufficient documentation

## 2013-03-05 DIAGNOSIS — C61 Malignant neoplasm of prostate: Secondary | ICD-10-CM | POA: Insufficient documentation

## 2013-03-05 DIAGNOSIS — M5137 Other intervertebral disc degeneration, lumbosacral region: Secondary | ICD-10-CM | POA: Insufficient documentation

## 2013-03-05 NOTE — Progress Notes (Signed)
Radiation Oncology         [phone]) [phone] ________________________________  Name: David Smith MRN: [id number]  Date: 03/05/2013  DOB: 11-07-1955  Follow-Up Visit Note  CC: Willow Ora, MD  Crecencio Mc, MD  Diagnosis:   57 y.o. gentleman with stage T3 adenocarcinoma of the prostate with a Gleason's score of 4+3 and a pre-op PSA of 34.25 status post robotic-assisted laparoscopic radical prostatectomy 06/17/2012 with focally positive margins and focal extracapsular extension and postoperative rising  PSA now 0.19  Narrative:  The patient returns today for routine follow-up.  I have enjoyed meeting with him in initial consultation on December 30 prior surgery and again on May 4 when his PSA reached 0.12. I recommended that he consider radiotherapy at that time.  He was reluctant to proceed based on some ongoing urinary incontinence issues. He wanted to follow his PSA longer to confirm that remained detectable and was suspicious prior to committing to salvage radiotherapy. His PSA subsequently increased to 0.19 he returns today to discuss proceeding with radiation.                              ALLERGIES:  is allergic to other.  Meds: Current Outpatient Prescriptions  Medication Sig Dispense Refill  . acetaminophen (TYLENOL) 325 MG tablet Take 650 mg by mouth every 6 (six) hours as needed. Pain      . HYDROcodone-acetaminophen (NORCO) 5-325 MG per tablet Take 1-2 tablets by mouth every 6 (six) hours as needed for pain.  30 tablet  0  . lisinopril-hydrochlorothiazide (PRINZIDE,ZESTORETIC) 20-12.5 MG per tablet TAKE TWO TABLETS BY MOUTH EVERY DAY  180 tablet  1  . pregabalin (LYRICA) 75 MG capsule Take 75 mg by mouth 2 (two) times daily.      . sildenafil (VIAGRA) 100 MG tablet Take 100 mg by mouth daily as needed for erectile dysfunction.       No current facility-administered medications for this encounter.    Physical Findings: The patient is in no acute distress. Patient is alert and  oriented.  height is 6' (1.829 m) and weight is 244 lb 12.8 oz (111.041 kg). His oral temperature is 98.4 F (36.9 C). His blood pressure is 147/89 and his pulse is 56. His respiration is 16 and oxygen saturation is 100%. .  No significant changes.  Lab Findings: Lab Results  Component Value Date   WBC 6.3 12/26/2012   HGB 13.5 12/26/2012   HCT 41.0 12/26/2012   MCV 82.4 12/26/2012   PLT 175.0 12/26/2012    Radiographic Findings: Recent lumbar MRIs were reviewed and show degenerative changes with no overt metastatic disease.  Impression:  The patient has a rising PSA of 0 point one month following prostatectomy with adverse pathologic features. The patient would likely benefit from salvage radiotherapy to the prostatic fossa at this time.  Plan:  Today, talked the patient about radiotherapy. We talked with the details related to treatment again. He asks several appropriate questions about potential side effects as well as timing. He is also interested in being evaluated by neurosurgery for degenerative disc disease with some evidence for herniated discs on recent lumbar MRI. In addition, patient would like to get an updated PSA to further confirm the PSA remains detectable and seems to be showing some evidence of increase.  The patient plans to request followup PSA with Dr. Laverle Patter in the next week or so. The patient plans to meet with  neurosurgery consultation regarding his degenerative disc disease. The patient tentatively plans to return to radiation oncology on October 22 to proceed with radiation planning for prostatic fossa radiotherapy.  _____________________________________  Artist Pais. Kathrynn Running, M.D.

## 2013-03-05 NOTE — Progress Notes (Signed)
See progress note under physician encounter. 

## 2013-03-05 NOTE — Progress Notes (Signed)
Complete PATIENT MEASURE OF DISTRESS worksheet with a score of 2 submitted to social work.  

## 2013-03-05 NOTE — Progress Notes (Signed)
Returning today per Dr. Laverle Patter advise to discuss moving forward with radiation therapy because of rising PSA. Reports that he only leaks urine when he coughs, passes flatus, or sneezes. Denies that he has to wear a depends. Reports noctural incontinence has resolved. Denies getting up during the night void. Denies dysuria or hematuria. Reports a strong urine stream. IPSS of 1 noted. Reports contact back pain 3 on a scale of 0-10. Reports that he has bone spurs at L# and L$ and a herniated disc at L5. The L5 pain radiates to the right hip. Reports taking norco and lyrica prn pain. Reports certain movements and lifting an increase pain to a 10. Denies weight loss and night sweats.

## 2013-03-26 ENCOUNTER — Ambulatory Visit
Admission: RE | Admit: 2013-03-26 | Discharge: 2013-03-26 | Disposition: A | Discharge status: Home / Self Care | Payer: Medicaid Other | Source: Ambulatory Visit | Attending: Radiation Oncology | Admitting: Radiation Oncology

## 2013-03-26 DIAGNOSIS — C61 Malignant neoplasm of prostate: Secondary | ICD-10-CM | POA: Insufficient documentation

## 2013-03-26 DIAGNOSIS — R5381 Other malaise: Secondary | ICD-10-CM | POA: Insufficient documentation

## 2013-03-26 DIAGNOSIS — Z510 Encounter for antineoplastic radiation therapy: Secondary | ICD-10-CM | POA: Insufficient documentation

## 2013-03-26 NOTE — Progress Notes (Signed)
  Radiation Oncology         [phone] ________________________________  Name: GOR VESTAL MRN: [id number]  Date: 03/26/2013  DOB: [birth year]  SIMULATION AND TREATMENT PLANNING NOTE  DIAGNOSIS:  57 y.o. gentleman with stage T3 adenocarcinoma of the prostate with a Gleason's score of 4+3 and a pre-op PSA of 34.25 status post robotic-assisted laparoscopic radical prostatectomy 06/17/2012 with focally positive margins and focal extracapsular extension and postoperative rising PSA now 0.19  NARRATIVE:  The patient was brought to the CT Simulation planning suite.  Identity was confirmed.  All relevant records and images related to the planned course of therapy were reviewed.  The patient freely provided informed written consent to proceed with treatment after reviewing the details related to the planned course of therapy. The consent form was witnessed and verified by the simulation staff.  Then, the patient was set-up in a stable reproducible supine position for radiation therapy.  A vacuum lock pillow device was custom fabricated to position his legs in a reproducible immobilized position.  Then, I performed a urethrogram under sterile conditions to identify the prostatic apex.  CT images were obtained.  Surface markings were placed.  The CT images were loaded into the planning software.  Then the prostate target and avoidance structures including the rectum, bladder, bowel and hips were contoured.  Treatment planning then occurred.  The radiation prescription was entered and confirmed.  A total of one complex treatment device was fabricated. I have requested : Intensity Modulated Radiotherapy (IMRT) is medically necessary for this case for the following reason:  Rectal sparing.Marland Kitchen  PLAN:  The patient will receive 68.4 Gy in 38 fractions.  ________________________________  Artist Pais Kathrynn Running, M.D.

## 2013-04-07 ENCOUNTER — Ambulatory Visit
Admission: RE | Admit: 2013-04-07 | Discharge: 2013-04-07 | Disposition: A | Discharge status: Home / Self Care | Payer: Medicaid Other | Source: Ambulatory Visit | Attending: Radiation Oncology | Admitting: Radiation Oncology

## 2013-04-08 ENCOUNTER — Ambulatory Visit
Admission: RE | Admit: 2013-04-08 | Discharge: 2013-04-08 | Disposition: A | Discharge status: Home / Self Care | Payer: Medicaid Other | Source: Ambulatory Visit | Attending: Radiation Oncology | Admitting: Radiation Oncology

## 2013-04-09 ENCOUNTER — Ambulatory Visit
Admission: RE | Admit: 2013-04-09 | Discharge: 2013-04-09 | Disposition: A | Discharge status: Home / Self Care | Payer: Medicaid Other | Source: Ambulatory Visit | Attending: Radiation Oncology | Admitting: Radiation Oncology

## 2013-04-09 ENCOUNTER — Encounter: Payer: Self-pay | Admitting: Radiation Oncology

## 2013-04-09 VITALS — BP 132/87 | HR 70 | Temp 99.5°F | Wt 244.4 lb

## 2013-04-09 DIAGNOSIS — C61 Malignant neoplasm of prostate: Secondary | ICD-10-CM

## 2013-04-09 NOTE — Addendum Note (Signed)
Encounter addended by: Tessa Lerner, RN on: 04/09/2013  7:45 PM<BR>     Documentation filed: Notes Section

## 2013-04-09 NOTE — Progress Notes (Addendum)
Patient here for weekly assessment of radiation to prostate.Completed 3 out 38.No problems.Has some frequency and nocturia x 1.Given Radiation Therapy and You Booklet and reviewed some of the side effects that he may have in regard to frequency/urgency and burning on urination as well as frequent soft stools to diarrhea and fatigue.Will need reinforcement on weekly assessment.Also reinforce need to push po fluids.

## 2013-04-09 NOTE — Progress Notes (Signed)
  Radiation Oncology         [phone] ________________________________  Name: David Smith MRN: [id number]  Date: 04/09/2013  DOB: Dec 27, 1955  Weekly Radiation Therapy Management  Current Dose: 3.6 Gy     Planned Dose:  68.4 Gy  Narrative . . . . . . . . The patient presents for routine under treatment assessment.                                   The patient is without complaint.                                 Set-up films were reviewed.                                 The chart was checked. Physical Findings. . .  weight is 244 lb 6.4 oz (110.859 kg). His temperature is 99.5 F (37.5 C). His blood pressure is 132/87 and his pulse is 70. . Weight essentially stable.  No significant changes. Impression . . . . . . . The patient is tolerating radiation. Plan . . . . . . . . . . . . Continue treatment as planned.  ________________________________  Artist Pais. Kathrynn Running, M.D.

## 2013-04-10 ENCOUNTER — Ambulatory Visit
Admission: RE | Admit: 2013-04-10 | Discharge: 2013-04-10 | Disposition: A | Discharge status: Home / Self Care | Payer: Medicaid Other | Source: Ambulatory Visit | Attending: Radiation Oncology | Admitting: Radiation Oncology

## 2013-04-11 ENCOUNTER — Ambulatory Visit
Admission: RE | Admit: 2013-04-11 | Discharge: 2013-04-11 | Disposition: A | Discharge status: Home / Self Care | Payer: Medicaid Other | Source: Ambulatory Visit | Attending: Radiation Oncology | Admitting: Radiation Oncology

## 2013-04-14 ENCOUNTER — Ambulatory Visit
Admission: RE | Admit: 2013-04-14 | Discharge: 2013-04-14 | Disposition: A | Discharge status: Home / Self Care | Payer: Medicaid Other | Source: Ambulatory Visit | Attending: Radiation Oncology | Admitting: Radiation Oncology

## 2013-04-15 ENCOUNTER — Ambulatory Visit
Admission: RE | Admit: 2013-04-15 | Discharge: 2013-04-15 | Disposition: A | Discharge status: Home / Self Care | Payer: Medicaid Other | Source: Ambulatory Visit | Attending: Radiation Oncology | Admitting: Radiation Oncology

## 2013-04-16 ENCOUNTER — Ambulatory Visit
Admission: RE | Admit: 2013-04-16 | Discharge: 2013-04-16 | Disposition: A | Discharge status: Home / Self Care | Payer: Medicaid Other | Source: Ambulatory Visit | Attending: Radiation Oncology | Admitting: Radiation Oncology

## 2013-04-17 ENCOUNTER — Ambulatory Visit
Admission: RE | Admit: 2013-04-17 | Discharge: 2013-04-17 | Disposition: A | Discharge status: Home / Self Care | Payer: Medicaid Other | Source: Ambulatory Visit | Attending: Radiation Oncology | Admitting: Radiation Oncology

## 2013-04-18 ENCOUNTER — Ambulatory Visit
Admission: RE | Admit: 2013-04-18 | Discharge: 2013-04-18 | Disposition: A | Discharge status: Home / Self Care | Payer: Medicaid Other | Source: Ambulatory Visit | Attending: Radiation Oncology | Admitting: Radiation Oncology

## 2013-04-18 ENCOUNTER — Encounter: Payer: Self-pay | Admitting: Radiation Oncology

## 2013-04-18 VITALS — BP 118/82 | HR 69 | Resp 16 | Wt 248.8 lb

## 2013-04-18 DIAGNOSIS — C61 Malignant neoplasm of prostate: Secondary | ICD-10-CM

## 2013-04-18 NOTE — Progress Notes (Signed)
   Department of Radiation Oncology  Phone:  [phone] Fax:        [phone]  Weekly Treatment Note    Name: David Smith Date: 04/18/2013 MRN: [id number] DOB: [birth year]   Current dose: 18 Gy  Current fraction: 10   MEDICATIONS: Current Outpatient Prescriptions  Medication Sig Dispense Refill  . acetaminophen (TYLENOL) 325 MG tablet Take 650 mg by mouth every 6 (six) hours as needed. Pain      . HYDROcodone-acetaminophen (NORCO) 5-325 MG per tablet Take 1-2 tablets by mouth every 6 (six) hours as needed for pain.  30 tablet  0  . lisinopril-hydrochlorothiazide (PRINZIDE,ZESTORETIC) 20-12.5 MG per tablet TAKE TWO TABLETS BY MOUTH EVERY DAY  180 tablet  1  . pregabalin (LYRICA) 75 MG capsule Take 75 mg by mouth 2 (two) times daily.      . sildenafil (VIAGRA) 100 MG tablet Take 100 mg by mouth daily as needed for erectile dysfunction.       No current facility-administered medications for this encounter.     ALLERGIES: Other   LABORATORY DATA:  Lab Results  Component Value Date   WBC 6.3 12/26/2012   HGB 13.5 12/26/2012   HCT 41.0 12/26/2012   MCV 82.4 12/26/2012   PLT 175.0 12/26/2012   Lab Results  Component Value Date   NA 140 12/26/2012   K 3.5 12/26/2012   CL 107 12/26/2012   CO2 26 12/26/2012   Lab Results  Component Value Date   ALT 29 11/23/2011   AST 23 11/23/2011   ALKPHOS 74 11/23/2011   BILITOT 0.7 11/23/2011     NARRATIVE: Quitman Livings was seen today for weekly treatment management. The chart was checked and the patient's films were reviewed. The patient states that he is doing very well. No diarrhea. No dysuria. He does get up one time per night to go to the bathroom which is a slight increase for him.  PHYSICAL EXAMINATION: weight is 248 lb 12.8 oz (112.855 kg). His blood pressure is 118/82 and his pulse is 69. His respiration is 16.        ASSESSMENT: The patient is doing satisfactorily with treatment.  PLAN: We will continue with the  patient's radiation treatment as planned.

## 2013-04-18 NOTE — Progress Notes (Signed)
Reports increase in frequency. Reports on average he gets up at 0400 to void but, didn't prior to beginning treatment. Denies hematuria or dysuria. Denies fatigue. Denies diarrhea.

## 2013-04-21 ENCOUNTER — Ambulatory Visit
Admission: RE | Admit: 2013-04-21 | Discharge: 2013-04-21 | Disposition: A | Discharge status: Home / Self Care | Payer: Medicaid Other | Source: Ambulatory Visit | Attending: Radiation Oncology | Admitting: Radiation Oncology

## 2013-04-22 ENCOUNTER — Ambulatory Visit
Admission: RE | Admit: 2013-04-22 | Discharge: 2013-04-22 | Disposition: A | Discharge status: Home / Self Care | Payer: Medicaid Other | Source: Ambulatory Visit | Attending: Radiation Oncology | Admitting: Radiation Oncology

## 2013-04-23 ENCOUNTER — Ambulatory Visit
Admission: RE | Admit: 2013-04-23 | Discharge: 2013-04-23 | Disposition: A | Discharge status: Home / Self Care | Payer: Medicaid Other | Source: Ambulatory Visit | Attending: Radiation Oncology | Admitting: Radiation Oncology

## 2013-04-23 NOTE — Progress Notes (Signed)
David Smith here because he has been feeling dizzy when walking since Monday.  Orthostatic vitals done: bp sitting 163/92, hr 65.  BP standing 176/100, HR 65.  Oxygen saturation was 99% on room air.  He has not taken his lisinopril/hctz today.  Notified Dr. Kathrynn Running and he said to have patient rest and hydrate.  Notified Howell to rest today and to keep hydrated.

## 2013-04-24 ENCOUNTER — Ambulatory Visit
Admission: RE | Admit: 2013-04-24 | Discharge: 2013-04-24 | Disposition: A | Discharge status: Home / Self Care | Payer: Medicaid Other | Source: Ambulatory Visit | Attending: Radiation Oncology | Admitting: Radiation Oncology

## 2013-04-25 ENCOUNTER — Ambulatory Visit
Admission: RE | Admit: 2013-04-25 | Discharge: 2013-04-25 | Disposition: A | Discharge status: Home / Self Care | Payer: Medicaid Other | Source: Ambulatory Visit | Attending: Radiation Oncology | Admitting: Radiation Oncology

## 2013-04-25 ENCOUNTER — Encounter: Payer: Self-pay | Admitting: Radiation Oncology

## 2013-04-25 VITALS — BP 118/83 | HR 65 | Temp 98.1°F | Ht 72.0 in | Wt 242.0 lb

## 2013-04-25 DIAGNOSIS — C61 Malignant neoplasm of prostate: Secondary | ICD-10-CM

## 2013-04-25 NOTE — Progress Notes (Signed)
[name] has received 15 fractions to his prostate cancer.   He denies any increased urinary frequency, urgency,nocturia, nor proctitis.  He does note increase in episodes of defecation from once daily to twice daily, but there is no change in character of stool.    He reports he not longer has any dizziness since he increased his fluids and is taking his antihypertensive.

## 2013-04-26 ENCOUNTER — Encounter: Payer: Self-pay | Admitting: Radiation Oncology

## 2013-04-26 NOTE — Progress Notes (Signed)
  Radiation Oncology         [phone] ________________________________  Name: David Smith MRN: [id number]  Date: 04/25/2013  DOB: November 26, 1955  Weekly Radiation Therapy Management  Current Dose: 27 Gy     Planned Dose:  68.4 Gy  Narrative . . . . . . . . The patient presents for routine under treatment assessment.                                   The patient is without complaint.                                 Set-up films were reviewed.                                 The chart was checked. Physical Findings. . .  height is 6' (1.829 m) and weight is 242 lb (109.77 kg). His temperature is 98.1 F (36.7 C). His blood pressure is 118/83 and his pulse is 65. . Weight essentially stable.  No significant changes. Impression . . . . . . . The patient is tolerating radiation. Plan . . . . . . . . . . . . Continue treatment as planned.  ________________________________  Artist Pais. Kathrynn Running, M.D.

## 2013-04-27 ENCOUNTER — Ambulatory Visit
Admission: RE | Admit: 2013-04-27 | Discharge: 2013-04-27 | Disposition: A | Discharge status: Home / Self Care | Payer: Medicaid Other | Source: Ambulatory Visit | Attending: Radiation Oncology | Admitting: Radiation Oncology

## 2013-04-28 ENCOUNTER — Ambulatory Visit: Payer: Self-pay | Admitting: Radiation Oncology

## 2013-04-28 ENCOUNTER — Ambulatory Visit
Admission: RE | Admit: 2013-04-28 | Discharge: 2013-04-28 | Disposition: A | Discharge status: Home / Self Care | Payer: Medicaid Other | Source: Ambulatory Visit | Attending: Radiation Oncology | Admitting: Radiation Oncology

## 2013-04-29 ENCOUNTER — Ambulatory Visit
Admission: RE | Admit: 2013-04-29 | Discharge: 2013-04-29 | Disposition: A | Discharge status: Home / Self Care | Payer: Medicaid Other | Source: Ambulatory Visit | Attending: Radiation Oncology | Admitting: Radiation Oncology

## 2013-04-30 ENCOUNTER — Ambulatory Visit
Admission: RE | Admit: 2013-04-30 | Discharge: 2013-04-30 | Disposition: A | Discharge status: Home / Self Care | Payer: Medicaid Other | Source: Ambulatory Visit | Attending: Radiation Oncology | Admitting: Radiation Oncology

## 2013-04-30 ENCOUNTER — Ambulatory Visit
Admission: RE | Admit: 2013-04-30 | Discharge: 2013-04-30 | Disposition: A | Discharge status: Home / Self Care | Payer: Self-pay | Source: Ambulatory Visit | Attending: Radiation Oncology | Admitting: Radiation Oncology

## 2013-04-30 VITALS — BP 124/76 | HR 83 | Temp 98.5°F | Ht 72.0 in | Wt 241.2 lb

## 2013-04-30 DIAGNOSIS — C61 Malignant neoplasm of prostate: Secondary | ICD-10-CM

## 2013-04-30 NOTE — Progress Notes (Signed)
David Smith here for put visit.  He has had 19 fractions to his prostate.  He denies pain but has been having burning in his rectal area with bowel movements.  He said it started last Thursday.  He has started to take a stool softener to see if that would help.  He denies urinary frequency, dysuria, diarrhea and hematuria.  He usually gets up once per night to urinate.  He reports fatigue.

## 2013-05-03 ENCOUNTER — Encounter: Payer: Self-pay | Admitting: Radiation Oncology

## 2013-05-03 NOTE — Progress Notes (Signed)
  Radiation Oncology         [phone] ________________________________  Name: David Smith MRN: [id number]  Date: 04/30/2013  DOB: 12/04/1955  Weekly Radiation Therapy Management  Current Dose: 34.2 Gy     Planned Dose:  68.4 Gy  Narrative . . . . . . . . The patient presents for routine under treatment assessment. He has had 19 fractions to his prostate. He denies pain but has been having burning in his rectal area with bowel movements. He said it started last Thursday. He has started to take a stool softener to see if that would help. He denies urinary frequency, dysuria, diarrhea and hematuria. He usually gets up once per night to urinate. He reports fatigue                                  Set-up films were reviewed.                                 The chart was checked.  Physical Findings. . .  height is 6' (1.829 m) and weight is 241 lb 3.2 oz (109.408 kg). His temperature is 98.5 F (36.9 C). His blood pressure is 124/76 and his pulse is 83. . Weight essentially stable.  No significant changes.  Impression . . . . . . . The patient is tolerating radiation.  Plan . . . . . . . . . . . . Continue treatment as planned.  ________________________________  Artist Pais. Kathrynn Running, M.D.

## 2013-05-05 ENCOUNTER — Ambulatory Visit
Admission: RE | Admit: 2013-05-05 | Discharge: 2013-05-05 | Disposition: A | Discharge status: Home / Self Care | Payer: Medicaid Other | Source: Ambulatory Visit | Attending: Radiation Oncology | Admitting: Radiation Oncology

## 2013-05-06 ENCOUNTER — Ambulatory Visit
Admission: RE | Admit: 2013-05-06 | Discharge: 2013-05-06 | Disposition: A | Discharge status: Home / Self Care | Payer: Medicaid Other | Source: Ambulatory Visit | Attending: Radiation Oncology | Admitting: Radiation Oncology

## 2013-05-07 ENCOUNTER — Ambulatory Visit
Admission: RE | Admit: 2013-05-07 | Discharge: 2013-05-07 | Disposition: A | Discharge status: Home / Self Care | Payer: Medicaid Other | Source: Ambulatory Visit | Attending: Radiation Oncology | Admitting: Radiation Oncology

## 2013-05-08 ENCOUNTER — Ambulatory Visit
Admission: RE | Admit: 2013-05-08 | Discharge: 2013-05-08 | Disposition: A | Discharge status: Home / Self Care | Payer: Medicaid Other | Source: Ambulatory Visit | Attending: Radiation Oncology | Admitting: Radiation Oncology

## 2013-05-09 ENCOUNTER — Ambulatory Visit
Admission: RE | Admit: 2013-05-09 | Discharge: 2013-05-09 | Disposition: A | Discharge status: Home / Self Care | Payer: Self-pay | Source: Ambulatory Visit | Attending: Radiation Oncology | Admitting: Radiation Oncology

## 2013-05-09 ENCOUNTER — Ambulatory Visit
Admission: RE | Admit: 2013-05-09 | Discharge: 2013-05-09 | Disposition: A | Discharge status: Home / Self Care | Payer: Medicaid Other | Source: Ambulatory Visit | Attending: Radiation Oncology | Admitting: Radiation Oncology

## 2013-05-09 ENCOUNTER — Encounter: Payer: Self-pay | Admitting: Radiation Oncology

## 2013-05-09 VITALS — BP 120/75 | HR 67 | Temp 98.0°F | Resp 16 | Wt 242.2 lb

## 2013-05-09 DIAGNOSIS — C61 Malignant neoplasm of prostate: Secondary | ICD-10-CM

## 2013-05-09 NOTE — Progress Notes (Addendum)
Reports denies dysuria, diarrhea and hematuria. He usually gets up once per night to urinate at 0400. He reports mild fatigue. Reports rectal burning with bowel movements but, believe colace helps. Reports an increase in urinary frequency.

## 2013-05-11 ENCOUNTER — Encounter: Payer: Self-pay | Admitting: Radiation Oncology

## 2013-05-11 NOTE — Progress Notes (Signed)
  Radiation Oncology         [phone] ________________________________  Name: David Smith MRN: [id number]  Date: 05/09/2013  DOB: 1956/04/01  Weekly Radiation Therapy Management  Current/Planned Dose:  Reviewed/Approved  Narrative . . . . . . . . The patient presents for routine under treatment assessment.                                   The patient is without complaint.                                 Set-up films were reviewed.                                 The chart was checked. Physical Findings. . .  weight is 242 lb 3.2 oz (109.861 kg). His oral temperature is 98 F (36.7 C). His blood pressure is 120/75 and his pulse is 67. His respiration is 16. . Weight essentially stable.  No significant changes. Impression . . . . . . . The patient is tolerating radiation. Plan . . . . . . . . . . . . Continue treatment as planned.  ________________________________  Artist Pais. Kathrynn Running, M.D.

## 2013-05-12 ENCOUNTER — Ambulatory Visit
Admission: RE | Admit: 2013-05-12 | Discharge: 2013-05-12 | Disposition: A | Discharge status: Home / Self Care | Payer: Medicaid Other | Source: Ambulatory Visit | Attending: Radiation Oncology | Admitting: Radiation Oncology

## 2013-05-13 ENCOUNTER — Ambulatory Visit
Admission: RE | Admit: 2013-05-13 | Discharge: 2013-05-13 | Disposition: A | Discharge status: Home / Self Care | Payer: Medicaid Other | Source: Ambulatory Visit | Attending: Radiation Oncology | Admitting: Radiation Oncology

## 2013-05-14 ENCOUNTER — Ambulatory Visit
Admission: RE | Admit: 2013-05-14 | Discharge: 2013-05-14 | Disposition: A | Discharge status: Home / Self Care | Payer: Medicaid Other | Source: Ambulatory Visit | Attending: Radiation Oncology | Admitting: Radiation Oncology

## 2013-05-15 ENCOUNTER — Ambulatory Visit
Admission: RE | Admit: 2013-05-15 | Discharge: 2013-05-15 | Disposition: A | Discharge status: Home / Self Care | Payer: Medicaid Other | Source: Ambulatory Visit | Attending: Radiation Oncology | Admitting: Radiation Oncology

## 2013-05-16 ENCOUNTER — Ambulatory Visit
Admission: RE | Admit: 2013-05-16 | Discharge: 2013-05-16 | Disposition: A | Discharge status: Home / Self Care | Payer: Medicaid Other | Source: Ambulatory Visit | Attending: Radiation Oncology | Admitting: Radiation Oncology

## 2013-05-16 ENCOUNTER — Encounter: Payer: Self-pay | Admitting: Radiation Oncology

## 2013-05-16 VITALS — BP 131/68 | HR 71 | Temp 99.5°F | Resp 20 | Wt 244.1 lb

## 2013-05-16 DIAGNOSIS — C61 Malignant neoplasm of prostate: Secondary | ICD-10-CM

## 2013-05-16 NOTE — Progress Notes (Signed)
  Radiation Oncology         [phone] ________________________________  Name: David Smith MRN: [id number]  Date: 05/16/2013  DOB: [birth year]  Weekly Radiation Therapy Management  Current Dose: 52.2 Gy     Planned Dose:  68.4 Gy  Narrative . . . . . . . . The patient presents for routine under treatment assessment.                                   The patient is without complaint.                                 Set-up films were reviewed.                                 The chart was checked. Physical Findings. . .  weight is 244 lb 1.6 oz (110.723 kg). His oral temperature is 99.5 F (37.5 C). His blood pressure is 131/68 and his pulse is 71. His respiration is 20. . Weight essentially stable.  No significant changes. Impression . . . . . . . The patient is tolerating radiation. Plan . . . . . . . . . . . . Continue treatment as planned.  ________________________________  Artist Pais. Kathrynn Running, M.D.

## 2013-05-16 NOTE — Progress Notes (Signed)
Weekly rad txs 29/38 prostate completed, increased frequency voiding and bowel movements, no dysuria , last blood in stool this past Monday none since, gets fatigued at times 4:31 PM

## 2013-05-19 ENCOUNTER — Ambulatory Visit
Admission: RE | Admit: 2013-05-19 | Discharge: 2013-05-19 | Disposition: A | Discharge status: Home / Self Care | Payer: Medicaid Other | Source: Ambulatory Visit | Attending: Radiation Oncology | Admitting: Radiation Oncology

## 2013-05-19 ENCOUNTER — Other Ambulatory Visit: Payer: Self-pay | Admitting: Internal Medicine

## 2013-05-19 NOTE — Telephone Encounter (Signed)
rx refilled per protocol. DJR  

## 2013-05-20 ENCOUNTER — Ambulatory Visit
Admission: RE | Admit: 2013-05-20 | Discharge: 2013-05-20 | Disposition: A | Discharge status: Home / Self Care | Payer: Medicaid Other | Source: Ambulatory Visit | Attending: Radiation Oncology | Admitting: Radiation Oncology

## 2013-05-21 ENCOUNTER — Ambulatory Visit: Payer: Self-pay | Admitting: Radiation Oncology

## 2013-05-21 ENCOUNTER — Ambulatory Visit
Admission: RE | Admit: 2013-05-21 | Discharge: 2013-05-21 | Disposition: A | Discharge status: Home / Self Care | Payer: Medicaid Other | Source: Ambulatory Visit | Attending: Radiation Oncology | Admitting: Radiation Oncology

## 2013-05-22 ENCOUNTER — Ambulatory Visit: Admission: RE | Admit: 2013-05-22 | Payer: Medicaid Other | Source: Ambulatory Visit

## 2013-05-23 ENCOUNTER — Ambulatory Visit
Admission: RE | Admit: 2013-05-23 | Discharge: 2013-05-23 | Disposition: A | Discharge status: Home / Self Care | Payer: Medicaid Other | Source: Ambulatory Visit | Attending: Radiation Oncology | Admitting: Radiation Oncology

## 2013-05-23 ENCOUNTER — Encounter: Payer: Self-pay | Admitting: Radiation Oncology

## 2013-05-23 VITALS — BP 135/91 | HR 67 | Temp 98.6°F | Ht 72.0 in | Wt 245.5 lb

## 2013-05-23 DIAGNOSIS — C61 Malignant neoplasm of prostate: Secondary | ICD-10-CM

## 2013-05-23 NOTE — Progress Notes (Signed)
Mr. Leicht has received 33 fractions to his pelvis area for prostate cancer.  He reports more urinary frequency during the day and nocturia 2-3 times.  He also states that when he coughs he feels as if he may have incontinence and makes an effort to keep this from happening.  He notes small frequent stool, but no diarrhea and reports that A&D ointment prescribed on last physician encounter has decreased the rectal burning and soreness.  He notes more fatigue since start of radiation and he goes to bed at 7:00 pm instead of staying up last as was his pattern prior to start of treatment.

## 2013-05-23 NOTE — Progress Notes (Signed)
   Department of Radiation Oncology  Phone:  [phone] Fax:        [phone]  Weekly Treatment Note    Name: David Smith Date: 05/23/2013 MRN: [id number] DOB: 1956-01-10   Current dose: 59.4 Gy  Current fraction: 33   MEDICATIONS: Current Outpatient Prescriptions  Medication Sig Dispense Refill  . acetaminophen (TYLENOL) 325 MG tablet Take 650 mg by mouth every 6 (six) hours as needed. Pain      . Docusate Calcium (STOOL SOFTENER PO) Take 1 tablet by mouth at bedtime.      Marland Kitchen HYDROcodone-acetaminophen (NORCO) 5-325 MG per tablet Take 1-2 tablets by mouth every 6 (six) hours as needed for pain.  30 tablet  0  . lisinopril-hydrochlorothiazide (PRINZIDE,ZESTORETIC) 20-12.5 MG per tablet TAKE TWO TABLETS BY MOUTH EVERY DAY  180 tablet  0  . pregabalin (LYRICA) 75 MG capsule Take 75 mg by mouth 2 (two) times daily.      . sildenafil (VIAGRA) 100 MG tablet Take 100 mg by mouth daily as needed for erectile dysfunction.       No current facility-administered medications for this encounter.     ALLERGIES: Other   LABORATORY DATA:  Lab Results  Component Value Date   WBC 6.3 12/26/2012   HGB 13.5 12/26/2012   HCT 41.0 12/26/2012   MCV 82.4 12/26/2012   PLT 175.0 12/26/2012   Lab Results  Component Value Date   NA 140 12/26/2012   K 3.5 12/26/2012   CL 107 12/26/2012   CO2 26 12/26/2012   Lab Results  Component Value Date   ALT 29 11/23/2011   AST 23 11/23/2011   ALKPHOS 74 11/23/2011   BILITOT 0.7 11/23/2011     NARRATIVE: Quitman Livings was seen today for weekly treatment management. The chart was checked and the patient's films were reviewed. The patient states that he is doing fairly well. No significant change he states in urination over the last week. He has been using a and D. ointment which has helped with some rectal irritation.  PHYSICAL EXAMINATION: height is 6' (1.829 m) and weight is 245 lb 8 oz (111.358 kg). His temperature is 98.6 F (37 C). His blood  pressure is 135/91 and his pulse is 67.        ASSESSMENT: The patient is doing satisfactorily with treatment.  PLAN: We will continue with the patient's radiation treatment as planned.

## 2013-05-26 ENCOUNTER — Ambulatory Visit
Admission: RE | Admit: 2013-05-26 | Discharge: 2013-05-26 | Disposition: A | Discharge status: Home / Self Care | Payer: Medicaid Other | Source: Ambulatory Visit | Attending: Radiation Oncology | Admitting: Radiation Oncology

## 2013-05-27 ENCOUNTER — Ambulatory Visit
Admission: RE | Admit: 2013-05-27 | Discharge: 2013-05-27 | Disposition: A | Discharge status: Home / Self Care | Payer: Medicaid Other | Source: Ambulatory Visit | Attending: Radiation Oncology | Admitting: Radiation Oncology

## 2013-05-28 ENCOUNTER — Ambulatory Visit
Admission: RE | Admit: 2013-05-28 | Discharge: 2013-05-28 | Disposition: A | Discharge status: Home / Self Care | Payer: Medicaid Other | Source: Ambulatory Visit | Attending: Radiation Oncology | Admitting: Radiation Oncology

## 2013-05-30 ENCOUNTER — Ambulatory Visit
Admission: RE | Admit: 2013-05-30 | Discharge: 2013-05-30 | Disposition: A | Discharge status: Home / Self Care | Payer: Medicaid Other | Source: Ambulatory Visit | Attending: Radiation Oncology | Admitting: Radiation Oncology

## 2013-05-30 ENCOUNTER — Encounter: Payer: Self-pay | Admitting: Radiation Oncology

## 2013-05-30 ENCOUNTER — Ambulatory Visit: Payer: Self-pay

## 2013-05-30 VITALS — BP 127/71 | HR 62 | Resp 16 | Wt 243.7 lb

## 2013-05-30 DIAGNOSIS — C61 Malignant neoplasm of prostate: Secondary | ICD-10-CM

## 2013-05-30 NOTE — Progress Notes (Signed)
  Radiation Oncology         [phone]) [phone] ________________________________  Name: David Smith MRN: [id number]  Date: 05/30/2013  DOB: 1955-11-10  Weekly Radiation Therapy Management  Current Dose: 66.6 Gy     Planned Dose:  68.4 Gy  Narrative . . . . . . . . The patient presents for routine under treatment assessment.                                   The patient is without complaint.                                 Set-up films were reviewed.                                 The chart was checked. Physical Findings. . .  weight is 243 lb 11.2 oz (110.542 kg). His blood pressure is 127/71 and his pulse is 62. His respiration is 16. . Weight essentially stable.  No significant changes. Impression . . . . . . . The patient is tolerating radiation. Plan . . . . . . . . . . . . Continue treatment as planned.  ________________________________  Artist Pais. Kathrynn Running, M.D.

## 2013-05-30 NOTE — Progress Notes (Addendum)
He reports more urinary frequency during the day and nocturia 2-3 times. He also states that when he coughs he feels as if he may have incontinence and makes an effort to keep this from happening. He notes small frequent stool, but no diarrhea and reports that A&D ointment prescribed on last physician encounter has decreased the rectal burning and soreness. He notes more fatigue since start of radiation and he goes to bed at 7:00 pm instead of staying up last as was his pattern prior to start of treatment. Patient complete treatment on Monday therefore, a one month follow up appointment card was given. Scheduled to follow up with Laverle Patter middle of February for repeat PSA.

## 2013-06-02 ENCOUNTER — Encounter: Payer: Self-pay | Admitting: Radiation Oncology

## 2013-06-02 ENCOUNTER — Ambulatory Visit
Admission: RE | Admit: 2013-06-02 | Discharge: 2013-06-02 | Disposition: A | Discharge status: Home / Self Care | Payer: Medicaid Other | Source: Ambulatory Visit | Attending: Radiation Oncology | Admitting: Radiation Oncology

## 2013-06-02 ENCOUNTER — Ambulatory Visit: Payer: Self-pay

## 2013-06-03 NOTE — Progress Notes (Signed)
°  Radiation Oncology         [phone] ________________________________  Name: David Smith MRN: [id number]  Date: 06/02/2013  DOB: Nov 24, 1955  End of Treatment Note  Diagnosis:   57 y.o. gentleman with stage T3 adenocarcinoma of the prostate with a Gleason's score of 4+3 and a pre-op PSA of 34.25 status post robotic-assisted laparoscopic radical prostatectomy 06/17/2012 with focally positive margins and focal extracapsular extension and postoperative rising PSA now 0.19   Indication for treatment:  Salvage, Curative       Radiation treatment dates:   04/07/2013-06/02/2013  Site/dose:   The prostatic fossa was treated to 68.4 Gy in 38 fractions  Beams/energy:   Intensity Modulated Radiotherapy (IMRT) was medically necessary for this case for rectal sparing.  The patient was immobilized with a BodyFix pillow and positioned with pre-treatment MV-CT for image guidance.  His treatment consisted of helical IMRT on TomoTherapy.  Narrative: The patient tolerated radiation treatment relatively well.   He had some mild fatigue.  He also noted more urinary frequency during the day and nocturia 2-3 times. He also states that when he coughs he feels as if he may have incontinence and makes an effort to keep this from happening. He notes small frequent stool, but no diarrhea and reports that A&D ointment prescribed on last physician encounter has decreased the rectal burning and soreness  Plan: The patient has completed radiation treatment. The patient will return to radiation oncology clinic for routine followup in one month. I advised them to call or return sooner if they have any questions or concerns related to their recovery or treatment. ________________________________  Artist Pais. Kathrynn Running, M.D.

## 2013-07-03 ENCOUNTER — Encounter: Payer: Self-pay | Admitting: Radiation Oncology

## 2013-07-03 ENCOUNTER — Ambulatory Visit
Admission: RE | Admit: 2013-07-03 | Discharge: 2013-07-03 | Disposition: A | Discharge status: Home / Self Care | Payer: 59 | Source: Ambulatory Visit | Attending: Radiation Oncology | Admitting: Radiation Oncology

## 2013-07-03 VITALS — BP 132/73 | HR 75 | Resp 18 | Wt 241.3 lb

## 2013-07-03 DIAGNOSIS — C61 Malignant neoplasm of prostate: Secondary | ICD-10-CM

## 2013-07-03 NOTE — Progress Notes (Signed)
  Radiation Oncology         ([phone] ________________________________  Name: David Smith MRN: [id number]  Date: 07/03/2013  DOB: 1956-02-02  Follow-Up Visit Note  CC: [name], MD  [name], MD  Diagnosis:   58 y.o. gentleman with stage pT3 adenocarcinoma of the prostate with rising postoperative rising PSA to 0.19 s/p Salvage IMRT 04/07/2013-06/02/2013 to 68.4 Gy  Interval Since Last Radiation:  4  weeks  Narrative:  The patient returns today for routine follow-up.  Denies pain at this time. Denies dysuria or hematuria. Reports nocturia x 2 on average. Reports mild fatigue. Denies diarrhea or constipation. Reports bowel movements are less frequent than at completion of treatment. Reports rectal burning and soreness have resolved                             ALLERGIES:  is allergic to other.  Meds: Current Outpatient Prescriptions  Medication Sig Dispense Refill  . aspirin 81 MG tablet Take 81 mg by mouth daily.      Marland Kitchen lisinopril-hydrochlorothiazide (PRINZIDE,ZESTORETIC) 20-12.5 MG per tablet TAKE TWO TABLETS BY MOUTH EVERY DAY  180 tablet  0  . acetaminophen (TYLENOL) 325 MG tablet Take 650 mg by mouth every 6 (six) hours as needed. Pain      . Docusate Calcium (STOOL SOFTENER PO) Take 1 tablet by mouth at bedtime.      Marland Kitchen HYDROcodone-acetaminophen (NORCO) 5-325 MG per tablet Take 1-2 tablets by mouth every 6 (six) hours as needed for pain.  30 tablet  0  . pregabalin (LYRICA) 75 MG capsule Take 75 mg by mouth 2 (two) times daily.      . sildenafil (VIAGRA) 100 MG tablet Take 100 mg by mouth daily as needed for erectile dysfunction.       No current facility-administered medications for this encounter.    Physical Findings: The patient is in no acute distress. Patient is alert and oriented.  weight is 241 lb 4.8 oz (109.453 kg). His blood pressure is 132/73 and his pulse is 75. His respiration is 18. .  No significant changes.  Impression:  The patient is recovering from  the effects of radiation  Plan:  He will continue to follow-up with urology for ongoing PSA determinations.  I will look forward to following his response through their correspondence, and be happy to participate in care if clinically indicated.  I talked to the patient about what to expect in the future, including his risk for erectile dysfunction and rectal bleeding.  I encouraged him to call or return to the office if he has any question about his previous radiation or possible radiation effects.  He was comfortable with this plan.  _____________________________________  Sheral Apley. Tammi Klippel, M.D.

## 2013-07-03 NOTE — Progress Notes (Signed)
Denies pain at this time. Denies dysuria or hematuria. Reports nocturia x 2 on average. Reports mild fatigue. Denies diarrhea or constipation. Reports bowel movements are less frequent than at completion of treatment. Reports rectal burning and soreness have resolved.

## 2013-07-30 ENCOUNTER — Encounter: Payer: Self-pay | Admitting: *Deleted

## 2013-07-30 NOTE — Addendum Note (Signed)
Encounter addended by: Heywood Footman, RN on: 07/30/2013 10:29 AM<BR>     Documentation filed: Inpatient Document Flowsheet

## 2013-10-22 ENCOUNTER — Telehealth: Payer: Self-pay | Admitting: Radiation Oncology

## 2013-10-22 NOTE — Telephone Encounter (Signed)
Phoned as requested by Dr. Tammi Klippel to inquire about status of Medicaid application. Patient's wife, Darnelle Bos, reports that she is of the understanding they have been approved and that everything three months prior to January will be covered. She does want to confirm things are now covered. Explained this Probation officer will reach out to [place] to confirm this and call her tomorrow. She explains her husband is no longer working and can now be reached during the day.

## 2013-11-04 ENCOUNTER — Telehealth: Payer: Self-pay | Admitting: Radiation Oncology

## 2013-11-04 NOTE — Telephone Encounter (Signed)
Patient and wife are of the understanding Medicaid application was approved for January and will cover three months in advance. Presently, this writer's understanding from Jodelle Green is that the patient's Medicaid expired 03/04/2013. Ailene Ravel verbalized the patient should contact his Medicaid case worker but, she would be glad to provide itemized bills if needed. Phoned patient at home and on his cell to explain this. No answer. Left message requesting return call.

## 2013-11-06 ENCOUNTER — Telehealth: Payer: Self-pay | Admitting: Internal Medicine

## 2013-11-06 NOTE — Telephone Encounter (Signed)
Janett Billow, please begin PA in this medication

## 2013-11-06 NOTE — Telephone Encounter (Signed)
Caller name: Eusevio Relation to pt: Call back [place]: [facility]  Reason for call:   Pt wants a refill on RX lisinopril-hydrochlorothiazide (PRINZIDE,ZESTORETIC) 20-12.5 MG .  Pt states that we will need to contact insurance to get it approved.

## 2013-11-07 NOTE — Telephone Encounter (Signed)
Prior authorization initiated through [employer]. Awaiting determination. JG//CMA

## 2013-11-07 NOTE — Telephone Encounter (Signed)
PA pending, closing note until we hear response.

## 2013-11-12 ENCOUNTER — Telehealth: Payer: Self-pay | Admitting: Internal Medicine

## 2013-11-12 NOTE — Telephone Encounter (Signed)
Caller name: Cebastian Neis Relation to HU:OHFGBMS Call back number: [phone] Pharmacy:[facility] [place], [place] - [address]   Reason for call: patient called to request a refill for Lisinopril

## 2013-11-13 MED ORDER — LISINOPRIL-HYDROCHLOROTHIAZIDE 20-12.5 MG PO TABS: ORAL_TABLET | ORAL | Status: DC

## 2013-11-13 NOTE — Telephone Encounter (Signed)
rx sent to [facility].

## 2013-11-14 ENCOUNTER — Ambulatory Visit (INDEPENDENT_AMBULATORY_CARE_PROVIDER_SITE_OTHER): Payer: 59 | Admitting: Internal Medicine

## 2013-11-14 ENCOUNTER — Encounter: Payer: Self-pay | Admitting: Internal Medicine

## 2013-11-14 VITALS — BP 136/90 | HR 60 | Temp 98.0°F | Wt 241.0 lb

## 2013-11-14 DIAGNOSIS — R5381 Other malaise: Secondary | ICD-10-CM | POA: Insufficient documentation

## 2013-11-14 DIAGNOSIS — I10 Essential (primary) hypertension: Secondary | ICD-10-CM

## 2013-11-14 DIAGNOSIS — R202 Paresthesia of skin: Secondary | ICD-10-CM

## 2013-11-14 DIAGNOSIS — R209 Unspecified disturbances of skin sensation: Secondary | ICD-10-CM

## 2013-11-14 DIAGNOSIS — R5383 Other fatigue: Secondary | ICD-10-CM

## 2013-11-14 LAB — CBC WITH DIFFERENTIAL/PLATELET
Basophils Absolute: 0 10*3/uL (ref 0.0–0.1)
Basophils Relative: 0.4 % (ref 0.0–3.0)
Eosinophils Absolute: 0.2 10*3/uL (ref 0.0–0.7)
Eosinophils Relative: 4 % (ref 0.0–5.0)
HCT: 41.6 % (ref 39.0–52.0)
Hemoglobin: 13.4 g/dL (ref 13.0–17.0)
Lymphocytes Relative: 19.8 % (ref 12.0–46.0)
Lymphs Abs: 1.1 10*3/uL (ref 0.7–4.0)
MCHC: 32.3 g/dL (ref 30.0–36.0)
MCV: 81.9 fl (ref 78.0–100.0)
Monocytes Absolute: 0.4 10*3/uL (ref 0.1–1.0)
Monocytes Relative: 7.2 % (ref 3.0–12.0)
Neutro Abs: 3.8 10*3/uL (ref 1.4–7.7)
Neutrophils Relative %: 68.6 % (ref 43.0–77.0)
Platelets: 162 10*3/uL (ref 150.0–400.0)
RBC: 5.08 Mil/uL (ref 4.22–5.81)
RDW: 15 % (ref 11.5–15.5)
WBC: 5.6 10*3/uL (ref 4.0–10.5)

## 2013-11-14 LAB — COMPREHENSIVE METABOLIC PANEL
ALT: 25 U/L (ref 0–53)
AST: 21 U/L (ref 0–37)
Albumin: 4 g/dL (ref 3.5–5.2)
Alkaline Phosphatase: 70 U/L (ref 39–117)
BUN: 19 mg/dL (ref 6–23)
CO2: 27 mEq/L (ref 19–32)
Calcium: 9.3 mg/dL (ref 8.4–10.5)
Chloride: 106 mEq/L (ref 96–112)
Creatinine, Ser: 1.4 mg/dL (ref 0.4–1.5)
GFR: 69.16 mL/min (ref >60.00)
Glucose, Bld: 82 mg/dL (ref 70–99)
Potassium: 3.9 mEq/L (ref 3.5–5.1)
Sodium: 141 mEq/L (ref 135–145)
Total Bilirubin: 0.4 mg/dL (ref 0.2–1.2)
Total Protein: 7.2 g/dL (ref 6.0–8.3)

## 2013-11-14 LAB — TSH: TSH: 1.18 u[IU]/mL (ref 0.35–4.50)

## 2013-11-14 LAB — FOLATE: Folate: 14.1 ng/mL (ref >5.9)

## 2013-11-14 LAB — VITAMIN B12: Vitamin B-12: 388 pg/mL (ref 211–911)

## 2013-11-14 NOTE — Progress Notes (Signed)
Pre visit review using our clinic review tool, if applicable. No additional management support is needed unless otherwise documented below in the visit note. 

## 2013-11-14 NOTE — Patient Instructions (Signed)
Get your blood work before you leave   Check the  blood pressure 2 or 3 times a month  be sure it is between 110/60 and 140/85. Ideal blood pressure is 120/80. If it is consistently higher or lower, let me know  Next visit is for routine check up in 3 months,  fasting Please make an appointment

## 2013-11-14 NOTE — Progress Notes (Signed)
Subjective:    Patient ID: David Smith, male    DOB: 1956-02-03, 58 y.o.   MRN: [id number]  DOS:  11/14/2013 Type of  Visit: ROV, Has several concerns History:  Complained of lack of energy for a while, he goes to bed fatigue and wakes up fatigue. On further questioning, he does snore and feels sleepy most of the day.  Also several months history of left arm numbness, states that the whole arm is numb including all his fingers. Also now experiencing some right shoulder numbness but the right arm is okay. Denies any pain in the neck or upper extremities.  Hypertension, good medication compliance, ambulatory BPs are checked from time to time and they are mostly okay but occasionally high numbers  Also last year had back pain, see assessment and plan  ROS Denies chest pain, difficulty breathing no lower extremity edema. No anxiety depression per se No neck pain, gait disorder; occasionally has difficulty controlling his bladder or bowels but was told that it was due to prostate cancer surgery. No lower extremity paresthesias.    Past Medical History  Diagnosis Date  . Prostate cancer 02/06/12    gleason 4+3=7, volume 56.4 cc, PSA 34.25  . Heart murmur     dx as a teen, Stress ECHO 2008 (-)   . Hypertension   . Chronic lower back pain 06-11-12    daily a problem Pain level 7, Tylenol helpful(down to level 2)  . Refusal of blood transfusions as patient is Jehovah's Witness 06-11-12    Pt. desires to sign refusal to permit blood products with exception-to sign 06-17-12    Past Surgical History  Procedure Laterality Date  . Achilles tendon repair      26 yrs ago, right foot  . Anal fissurectomy  2008  . Dental procedure  06-11-12    minor -office procedure to correct jaw issue-used anesthesia gas to do  . Robot assisted laparoscopic radical prostatectomy  06/17/2012    Procedure: ROBOTIC ASSISTED LAPAROSCOPIC RADICAL PROSTATECTOMY LEVEL 2;  Surgeon: [name], MD;  Location: [facility];   Service: Urology;  Laterality: N/A;     . Lymphadenectomy  06/17/2012    Procedure: LYMPHADENECTOMY;  Surgeon: [name], MD;  Location: [facility];  Service: Urology;  Laterality: Bilateral;  . Prostate biopsy      History   Social History  . Marital Status: Married    Spouse Name: N/A    Number of Children: N/A  . Years of Education: N/A   Occupational History  . Not on file.   Social History Main Topics  . Smoking status: Never Smoker   . Smokeless tobacco: Never Used  . Alcohol Use: Yes     Comment: 1 weekly  . Drug Use: No  . Sexual Activity: Yes   Other Topics Concern  . Not on file   Social History Narrative  . No narrative on file        Medication List       This list is accurate as of: 11/14/13 11:59 PM.  Always use your most recent med list.               acetaminophen 325 MG tablet  Commonly known as:  TYLENOL  Take 650 mg by mouth every 6 (six) hours as needed. Pain     aspirin 81 MG tablet  Take 81 mg by mouth daily.     HYDROcodone-acetaminophen 5-325 MG per tablet  Commonly known as:  NORCO  Take 1-2 tablets by mouth every 6 (six) hours as needed for pain.     lisinopril-hydrochlorothiazide 20-12.5 MG per tablet  Commonly known as:  PRINZIDE,ZESTORETIC  TAKE TWO TABLETS BY MOUTH EVERY DAY     pregabalin 75 MG capsule  Commonly known as:  LYRICA  Take 75 mg by mouth 2 (two) times daily.     sildenafil 100 MG tablet  Commonly known as:  VIAGRA  Take 100 mg by mouth daily as needed for erectile dysfunction.     STOOL SOFTENER PO  Take 1 tablet by mouth at bedtime.           Objective:   Physical Exam BP 136/90  Pulse 60  Temp(Src) 98 F (36.7 C)  Wt 241 lb (109.317 kg)  SpO2 96%  General -- alert, well-developed, NAD.  NECK-- no TTP and FROM  Lungs -- normal respiratory effort, no intercostal retractions, no accessory muscle use, and normal breath sounds.  Heart-- normal rate, regular rhythm, no murmur.   Extremities-- no  pretibial edema bilaterally  Neurologic--  alert & oriented X3. Speech normal, gait appropriate for age, strength symmetric and appropriate for age.  DTRs symmetric. Straight leg test negative. Psych-- Cognition and judgment appear intact. Cooperative with normal attention span and concentration. No anxious or depressed appearing.       Assessment & Plan:     Back pain, last year MRI showed disk  disease, saw orthopedic surgery, they talk about surgery but he elected conservative treatment. Takes medication as needed

## 2013-11-15 ENCOUNTER — Telehealth: Payer: Self-pay | Admitting: Internal Medicine

## 2013-11-15 NOTE — Telephone Encounter (Signed)
Relevant patient education assigned to patient using Emmi. ° °

## 2013-11-16 NOTE — Assessment & Plan Note (Signed)
Fatigue, review of systems is positive for feeling sleepy, Epworth Sleepiness Scale score is 10 which is a positive screen for sleep apnea Will check a TSH and CBC but at the same time will  referr  to pulmonology. Sleep apnea? At some point his testosterone needs to be checked as well.

## 2013-11-16 NOTE — Assessment & Plan Note (Signed)
Hypertension, good medication compliance, check labs. Continue monitoring BPs

## 2013-11-16 NOTE — Assessment & Plan Note (Signed)
Upper extremity paresthesias, etiology unclear, will check C80 and folic acid. Recommend to discuss with orthopedic surgery, may need MRI of the neck

## 2013-11-17 NOTE — Telephone Encounter (Signed)
Additional information faxed to [facility]. Awaiting determination. JG//CMA

## 2013-11-19 ENCOUNTER — Telehealth: Payer: Self-pay | Admitting: Internal Medicine

## 2013-11-19 NOTE — Telephone Encounter (Signed)
Pt returned to office from missed call.  No notes to see who called.

## 2013-11-26 ENCOUNTER — Telehealth: Payer: Self-pay | Admitting: *Deleted

## 2013-11-26 NOTE — Telephone Encounter (Signed)
Caller name:  Wane Relation to pt:  self Call back number: [phone] Pharmacy:  Reason for call:   Pt called about lab results from 11/14/13.  I told him per Dr. Larose Kells note in labs, all labs are normal, Good results.

## 2013-11-27 ENCOUNTER — Encounter: Payer: Self-pay | Admitting: Internal Medicine

## 2013-11-27 ENCOUNTER — Ambulatory Visit (INDEPENDENT_AMBULATORY_CARE_PROVIDER_SITE_OTHER): Payer: 59 | Admitting: Internal Medicine

## 2013-11-27 VITALS — BP 122/84 | HR 60 | Ht 73.0 in | Wt 240.0 lb

## 2013-11-27 DIAGNOSIS — R5381 Other malaise: Secondary | ICD-10-CM

## 2013-11-27 DIAGNOSIS — R5383 Other fatigue: Principal | ICD-10-CM

## 2013-11-27 MED ORDER — TEMAZEPAM 15 MG PO CAPS: ORAL_CAPSULE | ORAL | Status: DC

## 2013-11-27 NOTE — Progress Notes (Signed)
11/27/13- 36 yoM never smoker COMPLAINS OF:  Referred by Dr. Larose Kells.  Over past 2 years waking up not feeling well rested and very fatigued.    Diagnosed with prostate cancer 2 years ago and radiation therapy ended about 3 months ago. Still fatigued after that. Not sure if he has a separate sleep disorder. Not aware of much snoring or limb movement according to his wife. Estimates 6-7 hours of sleep with no naps. He can doze off if quiet. Drinks coffee in the winter but doesn't think that has a lot of  Effect. He has to shift more at night because of numbness in his arm. Has degenerative disc disease. Bedtime between 11 and midnight with sleep latency 15 minutes, waking twice before up at 7 AM. He is retired.  Prior to Admission medications   Medication Sig Start Date End Date Taking? Authorizing Provider  acetaminophen (TYLENOL) 325 MG tablet Take 650 mg by mouth every 6 (six) hours as needed. Pain   Yes Historical Provider, MD  aspirin 81 MG tablet Take 81 mg by mouth daily.   Yes Historical Provider, MD  Docusate Calcium (STOOL SOFTENER PO) Take 1 tablet by mouth at bedtime.   Yes Historical Provider, MD  HYDROcodone-acetaminophen (NORCO) 5-325 MG per tablet Take 1-2 tablets by mouth every 6 (six) hours as needed for pain. 06/17/12  Yes Amanda Dancy, PA-C  lisinopril-hydrochlorothiazide (PRINZIDE,ZESTORETIC) 20-12.5 MG per tablet TAKE TWO TABLETS BY MOUTH EVERY DAY 11/13/13  Yes Colon Branch, MD  sildenafil (VIAGRA) 100 MG tablet Take 100 mg by mouth daily as needed for erectile dysfunction.   Yes Historical Provider, MD  pregabalin (LYRICA) 75 MG capsule Take 75 mg by mouth 2 (two) times daily.    Historical Provider, MD  temazepam (RESTORIL) 15 MG capsule 1 or 2 caps for sleep as needed 11/27/13   Deneise Lever, MD   Past Medical History  Diagnosis Date  . Prostate cancer 02/06/12    gleason 4+3=7, volume 56.4 cc, PSA 34.25  . Heart murmur     dx as a teen, Stress ECHO 2008 (-)   . Hypertension    . Chronic lower back pain 06-11-12    daily a problem Pain level 7, Tylenol helpful(down to level 2)  . Refusal of blood transfusions as patient is Jehovah's Witness 06-11-12    Pt. desires to sign refusal to permit blood products with exception-to sign 06-17-12   Past Surgical History  Procedure Laterality Date  . Achilles tendon repair      26 yrs ago, right foot  . Anal fissurectomy  2008  . Dental procedure  06-11-12    minor -office procedure to correct jaw issue-used anesthesia gas to do  . Robot assisted laparoscopic radical prostatectomy  06/17/2012    Procedure: ROBOTIC ASSISTED LAPAROSCOPIC RADICAL PROSTATECTOMY LEVEL 2;  Surgeon: [name], MD;  Location: [facility];  Service: Urology;  Laterality: N/A;     . Lymphadenectomy  06/17/2012    Procedure: LYMPHADENECTOMY;  Surgeon: [name], MD;  Location: [facility];  Service: Urology;  Laterality: Bilateral;  . Prostate biopsy     Family History  Problem Relation Age of Onset  . Cancer Mother     breast  . Heart disease Mother   . Cancer Father 58    prostate, removed  . Cancer Brother 12    prostate, tx w/xrt   History   Social History  . Marital Status: Married    Spouse Name: N/A  Number of Children: N/A  . Years of Education: N/A   Occupational History  . Not on file.   Social History Main Topics  . Smoking status: Never Smoker   . Smokeless tobacco: Never Used  . Alcohol Use: Yes     Comment: 1 weekly  . Drug Use: No  . Sexual Activity: Yes   Other Topics Concern  . Not on file   Social History Narrative  . No narrative on file   ROS-see HPI Constitutional:   No-   weight loss, night sweats, fevers, chills, +fatigue, lassitude. HEENT:   No-  headaches, difficulty swallowing, tooth/dental problems, sore throat,       No-  sneezing, itching, ear ache, nasal congestion, post nasal drip,  CV:  No-   chest pain, orthopnea, PND, swelling in lower extremities, anasarca,                                  dizziness,  palpitations Resp: No-   shortness of breath with exertion or at rest.              No-   productive cough,  No non-productive cough,  No- coughing up of blood.              No-   change in color of mucus.  No- wheezing.   Skin: No-   rash or lesions. GI:  No-   heartburn, indigestion, abdominal pain, nausea, vomiting, diarrhea,                 change in bowel habits, loss of appetite GU: No-   dysuria, change in color of urine, no urgency or frequency.  No- flank pain. MS:  No-   joint pain or swelling.  No- decreased range of motion.  No- back pain. Neuro-     + Left arm numbness related to disc disease Psych:  No- change in mood or affect. No depression or anxiety.  No memory loss.  OBJ- Physical Exam General- Alert, Oriented, Affect-appropriate, Distress- none acute, + medium build Skin- rash-none, lesions- none, excoriation- none Lymphadenopathy- none Head- atraumatic            Eyes- Gross vision intact, PERRLA, conjunctivae and secretions clear            Ears- Hearing, canals-normal            Nose- Clear, no-Septal dev, mucus, polyps, erosion, perforation             Throat- Mallampati II-III , mucosa clear , drainage- none, tonsils- atrophic Neck- flexible , trachea midline, no stridor , thyroid nl, carotid no bruit Chest - symmetrical excursion , unlabored           Heart/CV- RRR , no murmur , no gallop  , no rub, nl s1 s2                           - JVD- none , edema- none, stasis changes- none, varices- none           Lung- clear to P&A, wheeze- none, cough- none , dullness-none, rub- none           Chest wall-  Abd- tender-no, distended-no, bowel sounds-present, HSM- no Br/ Gen/ Rectal- Not done, not indicated Extrem- cyanosis- none, clubbing, none, atrophy- none, strength- nl Neuro- grossly intact to observation

## 2013-11-27 NOTE — Patient Instructions (Signed)
Try to get a half-hour more sleep daily, either earlier/ later in bed, or an occasional nap.  Explore whether caffeine does anything- perhaps a cup of coffee at lunch  Script to try a gentle sleep med at night for deeper sleep consolidation  - temazepam

## 2014-01-15 ENCOUNTER — Ambulatory Visit: Payer: 59 | Admitting: Internal Medicine

## 2014-02-21 NOTE — Assessment & Plan Note (Addendum)
Question if non-refreshing sleep and daytime fatigue are results of his cancer therapy or sleep disturbance from his degenerative disc disease. Plan-try some lifestyle changes first. We will get a sleep study if no better. Prescription for temazepam 15 mg, try a little caffeine during the daytime, occasional nap as needed and try to get more sleep.

## 2014-03-02 ENCOUNTER — Ambulatory Visit: Payer: 59 | Admitting: Internal Medicine

## 2014-08-05 LAB — PSA: PSA: 6.2

## 2014-08-25 ENCOUNTER — Other Ambulatory Visit (HOSPITAL_COMMUNITY): Payer: Self-pay | Admitting: Urology

## 2014-08-25 DIAGNOSIS — C61 Malignant neoplasm of prostate: Secondary | ICD-10-CM

## 2014-09-15 ENCOUNTER — Encounter (HOSPITAL_COMMUNITY)
Admission: RE | Admit: 2014-09-15 | Discharge: 2014-09-15 | Disposition: A | Discharge status: Home / Self Care | Payer: 59 | Source: Ambulatory Visit | Attending: Urology | Admitting: Urology

## 2014-09-15 DIAGNOSIS — C61 Malignant neoplasm of prostate: Secondary | ICD-10-CM | POA: Insufficient documentation

## 2014-09-15 MED ORDER — TECHNETIUM TC 99M MEDRONATE IV KIT
25.0000 | PACK | Freq: Once | INTRAVENOUS | Status: AC | PRN
  Administered 2014-09-15: 26.1 via INTRAVENOUS

## 2014-09-18 ENCOUNTER — Ambulatory Visit: Payer: 59 | Admitting: *Deleted

## 2014-09-18 DIAGNOSIS — Z006 Encounter for examination for normal comparison and control in clinical research program: Secondary | ICD-10-CM

## 2014-09-18 NOTE — Progress Notes (Signed)
[facility] EKG

## 2014-11-09 ENCOUNTER — Other Ambulatory Visit: Payer: Self-pay

## 2014-11-09 MED ORDER — LISINOPRIL-HYDROCHLOROTHIAZIDE 20-12.5 MG PO TABS: 2.0000 | ORAL_TABLET | Freq: Every day | ORAL | Status: DC

## 2014-12-21 ENCOUNTER — Ambulatory Visit (INDEPENDENT_AMBULATORY_CARE_PROVIDER_SITE_OTHER): Payer: 59 | Admitting: Internal Medicine

## 2014-12-21 ENCOUNTER — Encounter: Payer: Self-pay | Admitting: Internal Medicine

## 2014-12-21 VITALS — BP 120/70 | HR 62 | Temp 98.1°F | Ht 73.0 in | Wt 242.4 lb

## 2014-12-21 DIAGNOSIS — I10 Essential (primary) hypertension: Secondary | ICD-10-CM | POA: Diagnosis not present

## 2014-12-21 DIAGNOSIS — Z0000 Encounter for general adult medical examination without abnormal findings: Secondary | ICD-10-CM

## 2014-12-21 DIAGNOSIS — R51 Headache: Secondary | ICD-10-CM | POA: Diagnosis not present

## 2014-12-21 DIAGNOSIS — R519 Headache, unspecified: Secondary | ICD-10-CM

## 2014-12-21 DIAGNOSIS — C61 Malignant neoplasm of prostate: Secondary | ICD-10-CM | POA: Diagnosis not present

## 2014-12-21 LAB — CBC WITH DIFFERENTIAL/PLATELET
Basophils Absolute: 0 10*3/uL (ref 0.0–0.1)
Basophils Relative: 0.4 % (ref 0.0–3.0)
Eosinophils Absolute: 0.2 10*3/uL (ref 0.0–0.7)
Eosinophils Relative: 4.6 % (ref 0.0–5.0)
HCT: 38.6 % — ABNORMAL LOW (ref 39.0–52.0)
Hemoglobin: 12.8 g/dL — ABNORMAL LOW (ref 13.0–17.0)
Lymphocytes Relative: 21.4 % (ref 12.0–46.0)
Lymphs Abs: 1 10*3/uL (ref 0.7–4.0)
MCHC: 33.1 g/dL (ref 30.0–36.0)
MCV: 80.4 fl (ref 78.0–100.0)
Monocytes Absolute: 0.2 10*3/uL (ref 0.1–1.0)
Monocytes Relative: 5.2 % (ref 3.0–12.0)
Neutro Abs: 3.1 10*3/uL (ref 1.4–7.7)
Neutrophils Relative %: 68.4 % (ref 43.0–77.0)
Platelets: 164 10*3/uL (ref 150.0–400.0)
RBC: 4.8 Mil/uL (ref 4.22–5.81)
RDW: 14.9 % (ref 11.5–15.5)
WBC: 4.6 10*3/uL (ref 4.0–10.5)

## 2014-12-21 LAB — COMPREHENSIVE METABOLIC PANEL
ALT: 24 U/L (ref 0–53)
AST: 21 U/L (ref 0–37)
Albumin: 4.2 g/dL (ref 3.5–5.2)
Alkaline Phosphatase: 76 U/L (ref 39–117)
BUN: 19 mg/dL (ref 6–23)
CO2: 27 mEq/L (ref 19–32)
Calcium: 9.4 mg/dL (ref 8.4–10.5)
Chloride: 105 mEq/L (ref 96–112)
Creatinine, Ser: 1.29 mg/dL (ref 0.40–1.50)
GFR: 73.84 mL/min (ref >60.00)
Glucose, Bld: 87 mg/dL (ref 70–99)
Potassium: 3.9 mEq/L (ref 3.5–5.1)
Sodium: 140 mEq/L (ref 135–145)
Total Bilirubin: 0.5 mg/dL (ref 0.2–1.2)
Total Protein: 7.4 g/dL (ref 6.0–8.3)

## 2014-12-21 MED ORDER — PREGABALIN 75 MG PO CAPS: 75.0000 mg | ORAL_CAPSULE | Freq: Two times a day (BID) | ORAL | Status: DC

## 2014-12-21 MED ORDER — LISINOPRIL-HYDROCHLOROTHIAZIDE 20-12.5 MG PO TABS: 2.0000 | ORAL_TABLET | Freq: Every day | ORAL | Status: DC

## 2014-12-21 NOTE — Patient Instructions (Signed)
Get your blood work before you leave   For headaches:  IBUPROFEN (Advil or Motrin) 200 mg 2 tablets every 6 hours as needed for pain.  Always take it with food because may cause gastritis and ulcers.  If you notice nausea, stomach pain, change in the color of stools --->  Stop the medicine and let us know  Tylenol  500 mg OTC 2 tabs a day every 8 hours as needed for pain  Watch for triggers   For abdominal pain: Take lyrica daily

## 2014-12-21 NOTE — Progress Notes (Signed)
Pre visit review using our clinic review tool, if applicable. No additional management support is needed unless otherwise documented below in the visit note. 

## 2014-12-21 NOTE — Progress Notes (Addendum)
Subjective:    Patient ID: David Smith, male    DOB: August 25, 1955, 59 y.o.   MRN: [id number]  DOS:  12/21/2014 Type of visit - description : Several concerns Interval history: 6 months history of headache,  usually one episode a day, last about 15 minutes, episodes are random, at the forehead, either a left-sided or  bilateral. Occasional dizziness with it. Hypertension, good compliance with medication, no recent ambulatory BPs Also has noted that year history of a pain at the left upper quadrant of the abdomen, the area of pain is in a square shape, worse when he coughs. No thoracic pain or rash. No change with eating H/o prostate ca-- on HRT   Review of Systems  Denies sinus pain or congestion Occasional constipation No diplopia, slurredf speech or motor deficits Denies anxiety-depression  Past Medical History  Diagnosis Date  . Prostate cancer 02/06/12    gleason 4+3=7, volume 56.4 cc, PSA 34.25  . Heart murmur     dx as a teen, Stress ECHO 2008 (-)   . Hypertension   . Chronic lower back pain 06-11-12    daily a problem Pain level 7, Tylenol helpful(down to level 2)  . Refusal of blood transfusions as patient is Jehovah's Witness 06-11-12    Pt. desires to sign refusal to permit blood products with exception-to sign 06-17-12    Past Surgical History  Procedure Laterality Date  . Achilles tendon repair      26 yrs ago, right foot  . Anal fissurectomy  2008  . Dental procedure  06-11-12    minor -office procedure to correct jaw issue-used anesthesia gas to do  . Robot assisted laparoscopic radical prostatectomy  06/17/2012    Procedure: ROBOTIC ASSISTED LAPAROSCOPIC RADICAL PROSTATECTOMY LEVEL 2;  Surgeon: [name], MD;  Location: [facility];  Service: Urology;  Laterality: N/A;     . Lymphadenectomy  06/17/2012    Procedure: LYMPHADENECTOMY;  Surgeon: [name], MD;  Location: [facility];  Service: Urology;  Laterality: Bilateral;  . Prostate biopsy      History   Social  History  . Marital Status: Married    Spouse Name: N/A  . Number of Children: N/A  . Years of Education: N/A   Occupational History  . Not on file.   Social History Main Topics  . Smoking status: Never Smoker   . Smokeless tobacco: Never Used  . Alcohol Use: Yes     Comment: 1 weekly  . Drug Use: No  . Sexual Activity: Yes   Other Topics Concern  . Not on file   Social History Narrative        Medication List       This list is accurate as of: 12/21/14 11:59 PM.  Always use your most recent med list.               acetaminophen 325 MG tablet  Commonly known as:  TYLENOL  Take 650 mg by mouth every 6 (six) hours as needed. Pain     aspirin 81 MG tablet  Take 81 mg by mouth daily.     HYDROcodone-acetaminophen 5-325 MG per tablet  Commonly known as:  NORCO  Take 1-2 tablets by mouth every 6 (six) hours as needed for pain.     lisinopril-hydrochlorothiazide 20-12.5 MG per tablet  Commonly known as:  PRINZIDE,ZESTORETIC  Take 2 tablets by mouth daily.     pregabalin 75 MG capsule  Commonly known as:  LYRICA  Take 1 capsule (75 mg total) by mouth 2 (two) times daily.     sildenafil 100 MG tablet  Commonly known as:  VIAGRA  Take 100 mg by mouth daily as needed for erectile dysfunction.     temazepam 15 MG capsule  Commonly known as:  RESTORIL  1 or 2 caps for sleep as needed           Objective:   Physical Exam BP 120/70 mmHg  Pulse 62  Temp(Src) 98.1 F (36.7 C) (Oral)  Ht 6\' 1"  (1.854 m)  Wt 242 lb 6 oz (109.941 kg)  BMI 31.98 kg/m2  SpO2 99%    General:   Well developed, well nourished . NAD.  HEENT:  Normocephalic . Face symmetric, atraumatic EOMI. Perla Lungs:  CTA B Normal respiratory effort, no intercostal retractions, no accessory muscle use. Heart: RRR,  no murmur.  no pretibial edema bilaterally  Abdomen:  Not distended, soft, non-tender. No rebound or rigidity. No mass,organomegaly. No rash MSK: T spine no TTP Skin: Not  pale. Not jaundice Neurologic:  alert & oriented X3.  Speech normal, gait appropriate for age and unassisted Psych--  Cognition and judgment appear intact.  Cooperative with normal attention span and concentration.  Behavior appropriate. No anxious or depressed appearing.  Assessment & Plan:     HA, Tensional ?, neuro exam neg He has metastatic prostate ca, had several MRI-CT < 4 months ago, will get records, consider CT of the head if symptoms persist rec OTC PRN, call if pain severe Addendum:  Images reviewed, had a nuclear bone scan 09/15/2014 with no evidence of skeletal metastases.   Plan is the same, the patient will call if pain is not improving. Will let him know.  JP 12/24/2014   HTN Labs, BP today ok  Neuropathy? Non anatomical abd pain w/ cough, neuropathic?  Sx started before XRT Has lyrica for chronic backpain uses it prn, rec to use daily   Request a referral for a cscope  rec to RTC 3-4 months for a CPX

## 2015-01-27 DIAGNOSIS — C61 Malignant neoplasm of prostate: Secondary | ICD-10-CM | POA: Diagnosis not present

## 2015-04-23 ENCOUNTER — Encounter: Payer: 59 | Admitting: Internal Medicine

## 2015-05-20 DIAGNOSIS — C61 Malignant neoplasm of prostate: Secondary | ICD-10-CM | POA: Diagnosis not present

## 2015-05-20 LAB — PSA: PSA: <0.01

## 2015-05-26 ENCOUNTER — Encounter: Payer: Self-pay | Admitting: Gastroenterology

## 2015-05-27 DIAGNOSIS — C61 Malignant neoplasm of prostate: Secondary | ICD-10-CM | POA: Diagnosis not present

## 2015-06-06 HISTORY — PX: COLONOSCOPY W/ POLYPECTOMY: SHX1380

## 2015-06-28 ENCOUNTER — Ambulatory Visit (INDEPENDENT_AMBULATORY_CARE_PROVIDER_SITE_OTHER): Payer: Medicare Other | Admitting: Internal Medicine

## 2015-06-28 ENCOUNTER — Encounter: Payer: Self-pay | Admitting: Internal Medicine

## 2015-06-28 VITALS — BP 126/74 | HR 74 | Temp 98.2°F | Ht 73.0 in | Wt 254.5 lb

## 2015-06-28 DIAGNOSIS — D649 Anemia, unspecified: Secondary | ICD-10-CM

## 2015-06-28 DIAGNOSIS — Z23 Encounter for immunization: Secondary | ICD-10-CM

## 2015-06-28 DIAGNOSIS — I10 Essential (primary) hypertension: Secondary | ICD-10-CM | POA: Diagnosis not present

## 2015-06-28 DIAGNOSIS — G8929 Other chronic pain: Secondary | ICD-10-CM

## 2015-06-28 DIAGNOSIS — G629 Polyneuropathy, unspecified: Secondary | ICD-10-CM | POA: Diagnosis not present

## 2015-06-28 DIAGNOSIS — R51 Headache: Secondary | ICD-10-CM

## 2015-06-28 DIAGNOSIS — R519 Headache, unspecified: Secondary | ICD-10-CM

## 2015-06-28 LAB — CBC WITH DIFFERENTIAL/PLATELET
Basophils Absolute: 0 10*3/uL (ref 0.0–0.1)
Basophils Relative: 0.5 % (ref 0.0–3.0)
Eosinophils Absolute: 0.3 10*3/uL (ref 0.0–0.7)
Eosinophils Relative: 4.9 % (ref 0.0–5.0)
HCT: 37.8 % — ABNORMAL LOW (ref 39.0–52.0)
Hemoglobin: 12.2 g/dL — ABNORMAL LOW (ref 13.0–17.0)
Lymphocytes Relative: 19.5 % (ref 12.0–46.0)
Lymphs Abs: 1.1 10*3/uL (ref 0.7–4.0)
MCHC: 32.3 g/dL (ref 30.0–36.0)
MCV: 80.8 fl (ref 78.0–100.0)
Monocytes Absolute: 0.4 10*3/uL (ref 0.1–1.0)
Monocytes Relative: 6.7 % (ref 3.0–12.0)
Neutro Abs: 3.9 10*3/uL (ref 1.4–7.7)
Neutrophils Relative %: 68.4 % (ref 43.0–77.0)
Platelets: 181 10*3/uL (ref 150.0–400.0)
RBC: 4.68 Mil/uL (ref 4.22–5.81)
RDW: 14.7 % (ref 11.5–15.5)
WBC: 5.7 10*3/uL (ref 4.0–10.5)

## 2015-06-28 LAB — BASIC METABOLIC PANEL
BUN: 19 mg/dL (ref 6–23)
CO2: 28 mEq/L (ref 19–32)
Calcium: 9.3 mg/dL (ref 8.4–10.5)
Chloride: 106 mEq/L (ref 96–112)
Creatinine, Ser: 1.45 mg/dL (ref 0.40–1.50)
GFR: 64.4 mL/min (ref >60.00)
Glucose, Bld: 101 mg/dL — ABNORMAL HIGH (ref 70–99)
Potassium: 3.8 mEq/L (ref 3.5–5.1)
Sodium: 142 mEq/L (ref 135–145)

## 2015-06-28 LAB — FERRITIN: Ferritin: 216.9 ng/mL (ref 22.0–322.0)

## 2015-06-28 LAB — IRON: Iron: 60 ug/dL (ref 42–165)

## 2015-06-28 MED ORDER — TOPIRAMATE 25 MG PO TABS: 50.0000 mg | ORAL_TABLET | Freq: Every day | ORAL | Status: DC

## 2015-06-28 MED ORDER — LIDOCAINE 5 % EX PTCH: 1.0000 | MEDICATED_PATCH | CUTANEOUS | Status: DC

## 2015-06-28 NOTE — Progress Notes (Addendum)
Subjective:    Patient ID: David Smith, male    DOB: 02-Dec-1955, 60 y.o.   MRN: [id number]  DOS:  06/28/2015 Type of visit - description : Routine office visit Interval history: HTN: Good compliance of medication, BP today is very good  Continue with headache, nausea and dizziness: Symptoms are going on for a good while according to the patient, he has one or 2 episodes daily, they last 30 minutes, the 3 symptoms come together. Symptoms are not getting more frequent or intense. He admits to some visual disturbance (blurred vision) during the episodes.  Also, continue with a left upper quadrant abdominal pain, this is going on for years, is described as a deep ache located in a square shaped area, no radiation, always in the same place, no increased by po or deep breaths, worse w/ forceful coughs or if he bends over.    Review of Systems   Past Medical History  Diagnosis Date  . Prostate cancer ([place]) 02/06/12    gleason 4+3=7, volume 56.4 cc, PSA 34.25  . Heart murmur     dx as a teen, Stress ECHO 2008 (-)   . Hypertension   . Chronic lower back pain 06-11-12    daily a problem Pain level 7, Tylenol helpful(down to level 2)  . Refusal of blood transfusions as patient is Jehovah's Witness 06-11-12    Pt. desires to sign refusal to permit blood products with exception-to sign 06-17-12    Past Surgical History  Procedure Laterality Date  . Achilles tendon repair      26 yrs ago, right foot  . Anal fissurectomy  2008  . Dental procedure  06-11-12    minor -office procedure to correct jaw issue-used anesthesia gas to do  . Robot assisted laparoscopic radical prostatectomy  06/17/2012    Procedure: ROBOTIC ASSISTED LAPAROSCOPIC RADICAL PROSTATECTOMY LEVEL 2;  Surgeon: [name], MD;  Location: [facility];  Service: Urology;  Laterality: N/A;     . Lymphadenectomy  06/17/2012    Procedure: LYMPHADENECTOMY;  Surgeon: [name], MD;  Location: [facility];  Service: Urology;  Laterality: Bilateral;    . Prostate biopsy      Social History   Social History  . Marital Status: Married    Spouse Name: N/A  . Number of Children: N/A  . Years of Education: N/A   Occupational History  . Not on file.   Social History Main Topics  . Smoking status: Never Smoker   . Smokeless tobacco: Never Used  . Alcohol Use: Yes     Comment: 1 weekly  . Drug Use: No  . Sexual Activity: Yes   Other Topics Concern  . Not on file   Social History Narrative        Medication List       This list is accurate as of: 06/28/15  5:16 PM.  Always use your most recent med list.               acetaminophen 325 MG tablet  Commonly known as:  TYLENOL  Take 650 mg by mouth every 6 (six) hours as needed. Pain     aspirin 81 MG tablet  Take 81 mg by mouth daily.     CALCIUM 600 + D PO  Take 600 mg by mouth daily.     HYDROcodone-acetaminophen 5-325 MG tablet  Commonly known as:  NORCO  Take 1-2 tablets by mouth every 6 (six) hours as needed for pain.  lidocaine 5 %  Commonly known as:  LIDODERM  Place 1 patch onto the skin daily. Remove & Discard patch within 12 hours or as directed by MD     lisinopril-hydrochlorothiazide 20-12.5 MG tablet  Commonly known as:  PRINZIDE,ZESTORETIC  Take 2 tablets by mouth daily.     sildenafil 100 MG tablet  Commonly known as:  VIAGRA  Take 100 mg by mouth daily as needed for erectile dysfunction. Reported on 06/28/2015     temazepam 15 MG capsule  Commonly known as:  RESTORIL  1 or 2 caps for sleep as needed     topiramate 25 MG tablet  Commonly known as:  TOPAMAX  Take 2 tablets (50 mg total) by mouth daily.     VITAMIN B 12 PO  Take 1 tablet by mouth daily.           Objective:   Physical Exam BP 126/74 mmHg  Pulse 74  Temp(Src) 98.2 F (36.8 C) (Oral)  Ht 6\' 1"  (1.854 m)  Wt 254 lb 8 oz (115.44 kg)  BMI 33.58 kg/m2  SpO2 97% General:   Well developed, well nourished . NAD.  HEENT:  Normocephalic . Face symmetric,  atraumatic Lungs:  CTA B Normal respiratory effort, no intercostal retractions, no accessory muscle use. Heart: RRR,  no murmur.  no pretibial edema bilaterally  Abdomen:  Not distended, soft, non-tender. No rebound or rigidity. No mass,organomegaly Skin: Not pale. Not jaundice Neurologic:  alert & oriented X3.  Speech normal, gait appropriate for age and unassisted. DTRs and motor symmetric. EOMI. Pupils equal and reactive Psych--  Cognition and judgment appear intact.  Cooperative with normal attention span and concentration.  Behavior appropriate. No anxious or depressed appearing.     Assessment & Plan:   Assessment HTN Insomnia-- no need for med as off 06-2015 Chronic back pain Prostate cancer, DX 2013, PSA 34.25, s/p prostatectomy, lymphadenectomy.  2014. No transfusions, Jehovah witness H/o heart murmur, stress echo 2008 negative  PLAN: HTN: Well-controlled. Check a BMP and CBC. Also check iron and ferritin, last hemoglobin slightly low. Chronic episode of headache-nausea-dizziness: 2 episodes a day, associated with visual disturbances, related to migraines?.  At some point pt thought sx could be d/t  androgenic deprivation but urology disagreed  plan---- brain MRI and a trial with Topamax.  Neuropathy? See last visit 6 months ago, he continue with a non-anatomical abdominal pain. Neuropathy? Symptoms started before XRT .previously was rx Lyrica but cost was an issue, try lidoderm Primary care: To have a colonoscopy next month, flu shot today  RTC 2 months

## 2015-06-28 NOTE — Progress Notes (Signed)
Pre visit review using our clinic review tool, if applicable. No additional management support is needed unless otherwise documented below in the visit note. 

## 2015-06-28 NOTE — Patient Instructions (Addendum)
BEFORE YOU LEAVE THE OFFICE: GO TO THE LAB  Get the blood work     GO TO THE FRONT DESK Schedule labs to be done within few days (fasting)  Schedule a routine office visit or check up to be done in  2 months, no fasting     AFTER YOU LEAVE THE OFFICE:  Start Topamax 25 mg: 1 tablet at night for 10 days, then 2 tablets every night. Apply a Lidoderm patch to the left upper abdomen daily

## 2015-07-13 ENCOUNTER — Ambulatory Visit (AMBULATORY_SURGERY_CENTER): Payer: Self-pay | Admitting: *Deleted

## 2015-07-13 VITALS — Ht 73.0 in | Wt 259.0 lb

## 2015-07-13 DIAGNOSIS — Z1211 Encounter for screening for malignant neoplasm of colon: Secondary | ICD-10-CM

## 2015-07-13 MED ORDER — NA SULFATE-K SULFATE-MG SULF 17.5-3.13-1.6 GM/177ML PO SOLN: 1.0000 | Freq: Once | ORAL | Status: DC

## 2015-07-13 NOTE — Progress Notes (Signed)
No egg or soy allergy known to patient  No issues with past sedation with any surgeries  or procedures, no intubation problems  No diet pills per patient No home 02 use per patient  No blood thinners per patient  Pt has refusal of blood transfusions but has a list of alternative measures he would accept if this became necessary

## 2015-07-23 ENCOUNTER — Telehealth: Payer: Self-pay | Admitting: Gastroenterology

## 2015-07-23 NOTE — Telephone Encounter (Signed)
Informed patient we do have one free prep left that he can pick up today if he has time. Patient states he will come by at lunch to pick up free prep kit.

## 2015-07-27 ENCOUNTER — Ambulatory Visit (AMBULATORY_SURGERY_CENTER): Payer: Medicare Other | Admitting: Gastroenterology

## 2015-07-27 ENCOUNTER — Encounter: Payer: Self-pay | Admitting: Gastroenterology

## 2015-07-27 VITALS — BP 120/80 | HR 71 | Temp 97.8°F | Resp 22 | Ht 73.0 in | Wt 259.0 lb

## 2015-07-27 DIAGNOSIS — I10 Essential (primary) hypertension: Secondary | ICD-10-CM | POA: Diagnosis not present

## 2015-07-27 DIAGNOSIS — R011 Cardiac murmur, unspecified: Secondary | ICD-10-CM | POA: Diagnosis not present

## 2015-07-27 DIAGNOSIS — Z1211 Encounter for screening for malignant neoplasm of colon: Secondary | ICD-10-CM | POA: Diagnosis not present

## 2015-07-27 DIAGNOSIS — K635 Polyp of colon: Secondary | ICD-10-CM | POA: Diagnosis not present

## 2015-07-27 DIAGNOSIS — D123 Benign neoplasm of transverse colon: Secondary | ICD-10-CM | POA: Diagnosis not present

## 2015-07-27 MED ORDER — SODIUM CHLORIDE 0.9 % IV SOLN: 500.0000 mL | INTRAVENOUS | Status: DC

## 2015-07-27 NOTE — Progress Notes (Signed)
Stable to RR 

## 2015-07-27 NOTE — Patient Instructions (Signed)
YOU HAD AN ENDOSCOPIC PROCEDURE TODAY AT [facility]:   Refer to the procedure report that was given to you for any specific questions about what was found during the examination.  If the procedure report does not answer your questions, please call your gastroenterologist to clarify.  If you requested that your care partner not be given the details of your procedure findings, then the procedure report has been included in a sealed envelope for you to review at your convenience later.  YOU SHOULD EXPECT: Some feelings of bloating in the abdomen. Passage of more gas than usual.  Walking can help get rid of the air that was put into your GI tract during the procedure and reduce the bloating. If you had a lower endoscopy (such as a colonoscopy or flexible sigmoidoscopy) you may notice spotting of blood in your stool or on the toilet paper. If you underwent a bowel prep for your procedure, you may not have a normal bowel movement for a few days.  Please Note:  You might notice some irritation and congestion in your nose or some drainage.  This is from the oxygen used during your procedure.  There is no need for concern and it should clear up in a day or so.  SYMPTOMS TO REPORT IMMEDIATELY:   Following lower endoscopy (colonoscopy or flexible sigmoidoscopy):  Excessive amounts of blood in the stool  Significant tenderness or worsening of abdominal pains  Swelling of the abdomen that is new, acute  Fever of 100F or higher   For urgent or emergent issues, a gastroenterologist can be reached at any hour by calling [phone].   DIET: Your first meal following the procedure should be a small meal and then it is ok to progress to your normal diet. Heavy or fried foods are harder to digest and may make you feel nauseous or bloated.  Likewise, meals heavy in dairy and vegetables can increase bloating.  Drink plenty of fluids but you should avoid alcoholic beverages for 24  hours.  ACTIVITY:  You should plan to take it easy for the rest of today and you should NOT DRIVE or use heavy machinery until tomorrow (because of the sedation medicines used during the test).    FOLLOW UP: Our staff will call the number listed on your records the next business day following your procedure to check on you and address any questions or concerns that you may have regarding the information given to you following your procedure. If we do not reach you, we will leave a message.  However, if you are feeling well and you are not experiencing any problems, there is no need to return our call.  We will assume that you have returned to your regular daily activities without incident.  If any biopsies were taken you will be contacted by phone or by letter within the next 1-3 weeks.  Please call us at [phone] if you have not heard about the biopsies in 3 weeks.    SIGNATURES/CONFIDENTIALITY: You and/or your care partner have signed paperwork which will be entered into your electronic medical record.  These signatures attest to the fact that that the information above on your After Visit Summary has been reviewed and is understood.  Full responsibility of the confidentiality of this discharge information lies with you and/or your care-partner.  Polyp/Hemorrhoid handout given Repeat Colonoscopy in 5 years

## 2015-07-27 NOTE — Op Note (Signed)
[place]  [address] [place], [address]   COLONOSCOPY PROCEDURE REPORT  PATIENT: David Smith, David Smith  MR#: [id number] BIRTHDATE: 1956/02/28 , 80  yrs. old GENDER: male ENDOSCOPIST: Ladene Artist, MD, Mar[facility] Regal REFERRED BY: [name], M.D. PROCEDURE DATE:  07/27/2015 PROCEDURE:   Colonoscopy, screening and Colonoscopy with snare polypectomy First Screening Colonoscopy - Avg.  risk and is 50 yrs.  old or older Yes.  Prior Negative Screening - Now for repeat screening. N/A  History of Adenoma - Now for follow-up colonoscopy & has been > or = to 3 yrs.  N/A  Polyps removed today? Yes ASA CLASS:   Class II INDICATIONS:Screening for colonic neoplasia and Colorectal Neoplasm Risk Assessment for this procedure is average risk. MEDICATIONS: Monitored anesthesia care, Propofol 300 mg IV, and lidocaine 80 mg IV  DESCRIPTION OF PROCEDURE:   After the risks benefits and alternatives of the procedure were thoroughly explained, informed consent was obtained.  The digital rectal exam revealed no abnormalities of the rectum.   The LB [id number]-[id number]  endoscope was introduced through the anus and advanced to the cecum, which was identified by both the appendix and ileocecal valve. No adverse events experienced.   The quality of the prep was excellent. (Suprep was used)  The instrument was then slowly withdrawn as the colon was fully examined. Estimated blood loss is zero unless otherwise noted in this procedure report.   COLON FINDINGS: Two sessile polyps measuring 6-8 mm in size were found in the transverse colon.  Polypectomies were performed with a cold snare.  The resection was complete, the polyp tissue was completely retrieved and sent to histology.  Mild radiation proctitis in the rectum.   The examination was otherwise normal. Retroflexed views revealed internal Grade I hemorrhoids. The time to cecum = 1.8 Withdrawal time = 11.4   The scope was withdrawn and the  procedure completed. COMPLICATIONS: There were no immediate complications.  ENDOSCOPIC IMPRESSION: 1.   Two sessile polyps in the transverse colon; polypectomies performed with a cold snare 2.   Mild radiation proctitis 3.   Grade l internal hemorrhoids  RECOMMENDATIONS: 1.  Await pathology results 2.  Repeat colonoscopy in 5 years if polyp(s) adenomatous; otherwise 10 years  eSigned:  Ladene Artist, MD, [facility] 07/27/2015 11:17 AM

## 2015-07-27 NOTE — Progress Notes (Signed)
Called to room to assist during endoscopic procedure.  Patient ID and intended procedure confirmed with present staff. Received instructions for my participation in the procedure from the performing physician.  

## 2015-07-28 ENCOUNTER — Telehealth: Payer: Self-pay | Admitting: *Deleted

## 2015-07-28 NOTE — Telephone Encounter (Signed)
Message left

## 2015-07-29 ENCOUNTER — Inpatient Hospital Stay: Admission: RE | Admit: 2015-07-29 | Payer: Medicare Other | Source: Ambulatory Visit

## 2015-07-30 ENCOUNTER — Other Ambulatory Visit: Payer: Self-pay | Admitting: Urology

## 2015-07-30 DIAGNOSIS — Z8546 Personal history of malignant neoplasm of prostate: Secondary | ICD-10-CM

## 2015-07-30 DIAGNOSIS — Z79899 Other long term (current) drug therapy: Secondary | ICD-10-CM

## 2015-08-02 ENCOUNTER — Encounter: Payer: Self-pay | Admitting: Gastroenterology

## 2015-08-09 ENCOUNTER — Encounter (HOSPITAL_COMMUNITY): Payer: Self-pay

## 2015-08-09 ENCOUNTER — Emergency Department (HOSPITAL_COMMUNITY)
Admission: EM | Admit: 2015-08-09 | Discharge: 2015-08-09 | Disposition: A | Discharge status: Home / Self Care | Payer: Medicare Other | Attending: Emergency Medicine | Admitting: Emergency Medicine

## 2015-08-09 ENCOUNTER — Emergency Department (HOSPITAL_COMMUNITY): Payer: Medicare Other

## 2015-08-09 DIAGNOSIS — H8110 Benign paroxysmal vertigo, unspecified ear: Secondary | ICD-10-CM | POA: Insufficient documentation

## 2015-08-09 DIAGNOSIS — Y998 Other external cause status: Secondary | ICD-10-CM | POA: Insufficient documentation

## 2015-08-09 DIAGNOSIS — Z7982 Long term (current) use of aspirin: Secondary | ICD-10-CM | POA: Diagnosis not present

## 2015-08-09 DIAGNOSIS — R011 Cardiac murmur, unspecified: Secondary | ICD-10-CM | POA: Diagnosis not present

## 2015-08-09 DIAGNOSIS — Y9289 Other specified places as the place of occurrence of the external cause: Secondary | ICD-10-CM | POA: Insufficient documentation

## 2015-08-09 DIAGNOSIS — G8929 Other chronic pain: Secondary | ICD-10-CM | POA: Diagnosis not present

## 2015-08-09 DIAGNOSIS — W01198A Fall on same level from slipping, tripping and stumbling with subsequent striking against other object, initial encounter: Secondary | ICD-10-CM | POA: Insufficient documentation

## 2015-08-09 DIAGNOSIS — Z79899 Other long term (current) drug therapy: Secondary | ICD-10-CM | POA: Diagnosis not present

## 2015-08-09 DIAGNOSIS — S0081XA Abrasion of other part of head, initial encounter: Secondary | ICD-10-CM | POA: Insufficient documentation

## 2015-08-09 DIAGNOSIS — Z8546 Personal history of malignant neoplasm of prostate: Secondary | ICD-10-CM | POA: Diagnosis not present

## 2015-08-09 DIAGNOSIS — I10 Essential (primary) hypertension: Secondary | ICD-10-CM | POA: Diagnosis not present

## 2015-08-09 DIAGNOSIS — R42 Dizziness and giddiness: Secondary | ICD-10-CM | POA: Diagnosis not present

## 2015-08-09 DIAGNOSIS — R55 Syncope and collapse: Secondary | ICD-10-CM | POA: Diagnosis present

## 2015-08-09 DIAGNOSIS — Y9389 Activity, other specified: Secondary | ICD-10-CM | POA: Insufficient documentation

## 2015-08-09 LAB — CBC
HCT: 40.2 % (ref 39.0–52.0)
Hemoglobin: 12.8 g/dL — ABNORMAL LOW (ref 13.0–17.0)
MCH: 26.2 pg (ref 26.0–34.0)
MCHC: 31.8 g/dL (ref 30.0–36.0)
MCV: 82.2 fL (ref 78.0–100.0)
Platelets: 205 10*3/uL (ref 150–400)
RBC: 4.89 MIL/uL (ref 4.22–5.81)
RDW: 14.3 % (ref 11.5–15.5)
WBC: 6.8 10*3/uL (ref 4.0–10.5)

## 2015-08-09 LAB — BASIC METABOLIC PANEL
Anion gap: 8 (ref 5–15)
BUN: 12 mg/dL (ref 6–20)
CO2: 27 mmol/L (ref 22–32)
Calcium: 9.7 mg/dL (ref 8.9–10.3)
Chloride: 107 mmol/L (ref 101–111)
Creatinine, Ser: 1.29 mg/dL — ABNORMAL HIGH (ref 0.61–1.24)
GFR calc Af Amer: >60 mL/min (ref >60)
GFR calc non Af Amer: 60 mL/min — ABNORMAL LOW (ref >60)
Glucose, Bld: 112 mg/dL — ABNORMAL HIGH (ref 65–99)
Potassium: 4.3 mmol/L (ref 3.5–5.1)
Sodium: 142 mmol/L (ref 135–145)

## 2015-08-09 LAB — I-STAT TROPONIN, ED: Troponin i, poc: 0 ng/mL (ref 0.00–0.08)

## 2015-08-09 LAB — URINALYSIS, ROUTINE W REFLEX MICROSCOPIC
Bilirubin Urine: NEGATIVE
Glucose, UA: NEGATIVE mg/dL
Hgb urine dipstick: NEGATIVE
Ketones, ur: NEGATIVE mg/dL
Leukocytes, UA: NEGATIVE
Nitrite: NEGATIVE
Protein, ur: NEGATIVE mg/dL
Specific Gravity, Urine: 1.01 (ref 1.005–1.030)
pH: 5.5 (ref 5.0–8.0)

## 2015-08-09 LAB — TROPONIN I: Troponin I: <0.03 ng/mL (ref <0.031)

## 2015-08-09 LAB — CBG MONITORING, ED: Glucose-Capillary: 92 mg/dL (ref 65–99)

## 2015-08-09 MED ORDER — MECLIZINE HCL 25 MG PO TABS: 25.0000 mg | ORAL_TABLET | Freq: Two times a day (BID) | ORAL | Status: DC

## 2015-08-09 MED ORDER — SODIUM CHLORIDE 0.9 % IV BOLUS (SEPSIS)
1000.0000 mL | Freq: Once | INTRAVENOUS | Status: AC
  Administered 2015-08-09: 1000 mL via INTRAVENOUS

## 2015-08-09 MED ORDER — MECLIZINE HCL 25 MG PO TABS
25.0000 mg | ORAL_TABLET | Freq: Once | ORAL | Status: AC
  Administered 2015-08-09: 25 mg via ORAL
  Filled 2015-08-09: qty 1

## 2015-08-09 MED ORDER — ACETAMINOPHEN 500 MG PO TABS
1000.0000 mg | ORAL_TABLET | Freq: Once | ORAL | Status: AC
  Administered 2015-08-09: 1000 mg via ORAL
  Filled 2015-08-09: qty 2

## 2015-08-09 NOTE — ED Provider Notes (Signed)
CSN: [id number]     Arrival date & time 08/09/15  1036 History   First MD Initiated Contact with Patient 08/09/15 1135     Chief Complaint  Patient presents with  . Loss of Consciousness     (Consider location/radiation/quality/duration/timing/severity/associated sxs/prior Treatment) HPI  This is a 60 year old gentleman with a past medical history of prostate cancer as well as emergency department with chief complaint of syncope and vertigo. He states he has been dealing with vertigo episodes for several years. He states that at first he would have episodes of vertigo once every 3-4 months. Over the last few years. They have been increasing in frequency and intensity. He characterizes them as brief periods of intense dizziness and room spinning, lasting usually a few minutes at a time. He frequently has to stop what he is doing and lying down. He has not seen an ear, nose and throat, or neurology specialist about this previously. The patient states that today he had a sudden onset of vertigo, which he describes as a crescendo of extreme dizziness, weakness. Today he got very hot and flushed and lost consciousness. He states that he felt that he is going to lose consciousness. This happened twice. During the first episode. The patient fell forward hitting his face against the corner of his table. He has some facial pain but denies pain with bite, pain with eye movement, and epistaxis. He has a slight headache. He denies visual disturbances, unilateral weakness, difficulty with speech or swallowing. He currently the only has dizziness when he moves his head rapidly. He states that he is otherwise well when sitting still and focusing his eyes. Past Medical History  Diagnosis Date  . Prostate cancer ([place]) 02/06/12    gleason 4+3=7, volume 56.4 cc, PSA 34.25  . Heart murmur     dx as a teen, Stress ECHO 2008 (-)   . Hypertension   . Chronic lower back pain 06-11-12    daily a problem Pain level 7,  Tylenol helpful(down to level 2)  . Refusal of blood transfusions as patient is Jehov[employer]'s Witness 06-11-12    Pt. desires to sign refusal to permit blood products with exception-to sign 06-17-12  . H/O cardiovascular stress test     non ischemic EF 55% 05-15-2007   Past Surgical History  Procedure Laterality Date  . Achilles tendon repair      26 yrs ago, right foot  . Anal fissurectomy  2008  . Dental procedure  06-11-12    minor -office procedure to correct jaw issue-used anesthesia gas to do  . Robot assisted laparoscopic radical prostatectomy  06/17/2012    Procedure: ROBOTIC ASSISTED LAPAROSCOPIC RADICAL PROSTATECTOMY LEVEL 2;  Surgeon: [name], MD;  Location: [facility];  Service: Urology;  Laterality: N/A;     . Lymphadenectomy  06/17/2012    Procedure: LYMPHADENECTOMY;  Surgeon: [name], MD;  Location: [facility];  Service: Urology;  Laterality: Bilateral;  . Prostate biopsy     Family History  Problem Relation Age of Onset  . Cancer Mother     breast  . Heart disease Mother   . Cancer Father 30    prostate, removed  . Cancer Brother 2    prostate, tx w/xrt  . Colon cancer Neg Hx   . Colon polyps Neg Hx    Social History  Substance Use Topics  . Smoking status: Never Smoker   . Smokeless tobacco: Never Used  . Alcohol Use: 0.0 oz/week  0 Standard drinks or equivalent per week     Comment: rare     Review of Systems  Ten systems reviewed and are negative for acute change, except as noted in the HPI.    Allergies  Review of patient's allergies indicates no known allergies.  Home Medications   Prior to Admission medications   Medication Sig Start Date End Date Taking? Authorizing Provider  acetaminophen (TYLENOL) 325 MG tablet Take 650 mg by mouth every 6 (six) hours as needed. Pain    Historical Provider, MD  aspirin 81 MG tablet Take 81 mg by mouth daily.    Historical Provider, MD  Calcium Carb-Cholecalciferol (CALCIUM 600 + D PO) Take 600 mg by mouth daily.     Historical Provider, MD  Cyanocobalamin (VITAMIN B 12 PO) Take 1 tablet by mouth daily.    Historical Provider, MD  HYDROcodone-acetaminophen (NORCO) 5-325 MG per tablet Take 1-2 tablets by mouth every 6 (six) hours as needed for pain. Patient not taking: Reported on 12/21/2014 06/17/12   Debbrah Alar, PA-C  leuprolide (LUPRON) 11.25 MG injection Inject 11.25 mg into the muscle every 4 (four) months.    Historical Provider, MD  lidocaine (LIDODERM) 5 % Place 1 patch onto the skin daily. Remove & Discard patch within 12 hours or as directed by MD Patient not taking: Reported on 07/13/2015 06/28/15   Colon Branch, MD  lisinopril-hydrochlorothiazide (PRINZIDE,ZESTORETIC) 20-12.5 MG per tablet Take 2 tablets by mouth daily. 12/21/14   Colon Branch, MD  sildenafil (VIAGRA) 100 MG tablet Take 100 mg by mouth daily as needed for erectile dysfunction. Reported on 06/28/2015    Historical Provider, MD  temazepam (RESTORIL) 15 MG capsule 1 or 2 caps for sleep as needed 11/27/13   Deneise Lever, MD  topiramate (TOPAMAX) 25 MG tablet Take 2 tablets (50 mg total) by mouth daily. Patient not taking: Reported on 07/13/2015 06/28/15   Colon Branch, MD   BP 140/93 mmHg  Pulse 83  Temp(Src) 99.5 F (37.5 C) (Oral)  SpO2 100% Physical Exam  Constitutional: He is oriented to person, place, and time. He appears well-developed and well-nourished. No distress.  HENT:  Head: Normocephalic.    Mouth/Throat: Oropharynx is clear and moist.  Small abrasion to the right cheek, no bony tenderness, no pain with eye movement, strong bite without pain. No epistaxis or sign of septal hematoma.  Eyes: Conjunctivae and EOM are normal. Pupils are equal, round, and reactive to light. No scleral icterus.  No horizontal, vertical or rotational nystagmus  Neck: Normal range of motion. Neck supple.  Cardiovascular: Normal rate, regular rhythm and intact distal pulses.   Pulmonary/Chest: Effort normal and breath sounds normal. No respiratory  distress. He has no wheezes. He has no rales.  Abdominal: Soft. Bowel sounds are normal. There is no tenderness. There is no rebound and no guarding.  Musculoskeletal: Normal range of motion.  Lymphadenopathy:    He has no cervical adenopathy.  Neurological: He is alert and oriented to person, place, and time. He has normal reflexes. No cranial nerve deficit. He exhibits normal muscle tone. Coordination normal.  Mental Status:  Alert, oriented, thought content appropriate. Speech fluent without evidence of aphasia. Able to follow 2 step commands without difficulty.  Cranial Nerves:  II:  Peripheral visual fields grossly normal, pupils equal, round, reactive to light III,IV, VI: ptosis not present, extra-ocular motions intact bilaterally  V,VII: smile symmetric, facial light touch sensation equal VIII: hearing grossly normal bilaterally  IX,X: midline uvula rise  XI: bilateral shoulder shrug equal and strong XII: midline tongue extension  Motor:  5/5 in upper and lower extremities bilaterally including strong and equal grip strength and dorsiflexion/plantar flexion Sensory: Pinprick and light touch normal in all extremities.  Deep Tendon Reflexes: 2+ and symmetric  Cerebellar: normal finger-to-nose with bilateral upper extremities Gait: normal gait and balance CV: distal pulses palpable throughout   Skin: Skin is warm and dry. No rash noted. He is not diaphoretic.  Psychiatric: He has a normal mood and affect. His behavior is normal. Judgment and thought content normal.  Nursing note and vitals reviewed.   ED Course  Procedures (including critical care time) Labs Review Labs Reviewed  BASIC METABOLIC PANEL - Abnormal; Notable for the following:    Glucose, Bld 112 (*)    Creatinine, Ser 1.29 (*)    GFR calc non Af Amer 60 (*)    All other components within normal limits  CBC - Abnormal; Notable for the following:    Hemoglobin 12.8 (*)    All other components within normal  limits  TROPONIN I  URINALYSIS, ROUTINE W REFLEX MICROSCOPIC (NOT AT [facility])  CBG MONITORING, ED  POCT CBG (FASTING - GLUCOSE)-MANUAL ENTRY  I-STAT TROPOININ, ED    Imaging Review Ct Head Wo Contrast  08/09/2015  CLINICAL DATA:  Dizzy spells with syncopal episode this morning. History of hypertension. EXAM: CT HEAD WITHOUT CONTRAST TECHNIQUE: Contiguous axial images were obtained from the base of the skull through the vertex without intravenous contrast. COMPARISON:  None. FINDINGS: Brain: There is no evidence of acute intracranial hemorrhage, mass lesion, brain edema or extra-axial fluid collection. The ventricles and subarachnoid spaces are appropriately sized for age. There is no CT evidence of acute cortical infarction. Bones/sinuses/visualized face: The visualized paranasal sinuses, mastoid air cells and middle ears are clear. The calvarium is intact. IMPRESSION: Unremarkable noncontrast head CT. Electronically Signed   By: Richardean Sale M.D.   On: 08/09/2015 11:58   I have personally reviewed and evaluated these images and lab results as part of my medical decision-making.   EKG Interpretation   Date/Time:  Monday August 09 2015 10:49:40 EST Ventricular Rate:  80 PR Interval:  150 QRS Duration: 96 QT Interval:  396 QTC Calculation: 456 R Axis:   -56 Text Interpretation:  Normal sinus rhythm Left anterior fascicular block  Abnormal ECG Nonspecific ST abnormality No previous ECGs available  Confirmed by [facility] MD, [place] ([id number]) on 08/09/2015 3:58:48 PM      ED ECG REPORT   Date: 08/09/2015  Rate: 80  Rhythm: normal sinus rhythm  QRS Axis: normal  Intervals: normal  ST/T Wave abnormalities: normal  Conduction Disutrbances:left anterior fascicular block  Narrative Interpretation:   Old EKG Reviewed: unchanged  I have personally reviewed the EKG tracing and agree with the computerized printout as noted.   MDM   Final diagnoses:  None   Filed Vitals:   08/09/15 1051  08/09/15 1145 08/09/15 1245 08/09/15 1515  BP: 140/93 133/84 129/86 108/70  Pulse: 83 66 74 75  Temp: 99.5 F (37.5 C)     TempSrc: Oral     Resp:  17 25 16   SpO2: 100% 99% 99% 100%    Patient with Vertigo and syncope. Ekg shows LAFB and is not likely the cause of events. I have spoken with Dr. Nicole Kindred and we will proceed with an MRI. Clinically I feel this is a peripheral vertigo. I have given sign out to  Dr. Rosario Jacks who will assume care of the patient.     Margarita Mail, PA-C 08/09/15 [place], MD 08/09/15 2140

## 2015-08-09 NOTE — ED Provider Notes (Signed)
  Physical Exam  BP 108/70 mmHg  Pulse 75  Temp(Src) 99.5 F (37.5 C) (Oral)  Resp 16  SpO2 100%  Physical Exam  ED Course  Procedures  MDM  Patient to ED today with concern for dizziness or past several months. He was seen outpatient and was set up for MRI tomorrow. He presented to ED today due to continued symptoms, syncopal event.  Care taken over at 3:33 pm.  Please see previous provider's note for full details of ED stay.    Plan to follow-up MRI which disorder following discussion with Dr. Nicole Kindred from neurology. If MRI negative, plan to follow-up with ENT and discharged with meclizine for suspected peripheral vertigo. If positive patient will need admission for stroke workup.    MRI resulted. No findings of severe circulation stroke. Patient reevaluated with improvement in his symptoms. I discussed with him the possibility of an artifact on MRI brain as well as cervical changes of spinal stenosis. He'll follow-up with his outpatient doctor for this.  Patient given prescription for meclizine to use as outpatient and encouraged follow the primary care doctor within the next 2-3 days. Patient ambulatory in no acute distress at time of discharge.   I discussed case my attending, Dr. Hulda Marin, MD 08/09/15 2056  Leo Grosser, MD 08/10/15 [id number]

## 2015-08-09 NOTE — Discharge Instructions (Signed)
Please follow-up with your primary care physician for reevaluation of vertigo symptoms as well as cervical spinal stenosis.   Benign Positional Vertigo Vertigo is the feeling that you or your surroundings are moving when they are not. Benign positional vertigo is the most common form of vertigo. The cause of this condition is not serious (is benign). This condition is triggered by certain movements and positions (is positional). This condition can be dangerous if it occurs while you are doing something that could endanger you or others, such as driving.  CAUSES In many cases, the cause of this condition is not known. It may be caused by a disturbance in an area of the inner ear that helps your brain to sense movement and balance. This disturbance can be caused by a viral infection (labyrinthitis), head injury, or repetitive motion. RISK FACTORS This condition is more likely to develop in:  Women.  People who are 68 years of age or older. SYMPTOMS Symptoms of this condition usually happen when you move your head or your eyes in different directions. Symptoms may start suddenly, and they usually last for less than a minute. Symptoms may include:  Loss of balance and falling.  Feeling like you are spinning or moving.  Feeling like your surroundings are spinning or moving.  Nausea and vomiting.  Blurred vision.  Dizziness.  Involuntary eye movement (nystagmus). Symptoms can be mild and cause only slight annoyance, or they can be severe and interfere with daily life. Episodes of benign positional vertigo may return (recur) over time, and they may be triggered by certain movements. Symptoms may improve over time. DIAGNOSIS This condition is usually diagnosed by medical history and a physical exam of the head, neck, and ears. You may be referred to a health care provider who specializes in ear, nose, and throat (ENT) problems (otolaryngologist) or a provider who specializes in disorders of  the nervous system (neurologist). You may have additional testing, including:  MRI.  A CT scan.  Eye movement tests. Your health care provider may ask you to change positions quickly while he or she watches you for symptoms of benign positional vertigo, such as nystagmus. Eye movement may be tested with an electronystagmogram (ENG), caloric stimulation, the Dix-Hallpike test, or the roll test.  An electroencephalogram (EEG). This records electrical activity in your brain.  Hearing tests. TREATMENT Usually, your health care provider will treat this by moving your head in specific positions to adjust your inner ear back to normal. Surgery may be needed in severe cases, but this is rare. In some cases, benign positional vertigo may resolve on its own in 2-4 weeks. HOME CARE INSTRUCTIONS Safety  Move slowly.Avoid sudden body or head movements.  Avoid driving.  Avoid operating heavy machinery.  Avoid doing any tasks that would be dangerous to you or others if a vertigo episode would occur.  If you have trouble walking or keeping your balance, try using a cane for stability. If you feel dizzy or unstable, sit down right away.  Return to your normal activities as told by your health care provider. Ask your health care provider what activities are safe for you. General Instructions  Take over-the-counter and prescription medicines only as told by your health care provider.  Avoid certain positions or movements as told by your health care provider.  Drink enough fluid to keep your urine clear or pale yellow.  Keep all follow-up visits as told by your health care provider. This is important. SEEK MEDICAL CARE IF:  You have a fever.  Your condition gets worse or you develop new symptoms.  Your family or friends notice any behavioral changes.  Your nausea or vomiting gets worse.  You have numbness or a "pins and needles" sensation. SEEK IMMEDIATE MEDICAL CARE IF:  You have  difficulty speaking or moving.  You are always dizzy.  You faint.  You develop severe headaches.  You have weakness in your legs or arms.  You have changes in your hearing or vision.  You develop a stiff neck.  You develop sensitivity to light.   This information is not intended to replace advice given to you by your health care provider. Make sure you discuss any questions you have with your health care provider.   Document Released: 02/27/2006 Document Revised: 02/10/2015 Document Reviewed: 09/14/2014 Elsevier Interactive Patient Education [employer].

## 2015-08-09 NOTE — ED Notes (Signed)
Patient here with syncopal event this am and fell striking right side of face from fall. Patient reports that he has seen cardiology for same and to have MRI in am for this ongoing dizziness. Alert and oriented on arrival, complains of vague headache

## 2015-08-09 NOTE — ED Notes (Signed)
Pt here with complaints of long standing dizziness over the past months. sts it was happening once a month and gradually has increased to once a week. sts he became dizzy this am 4 and passed out. sts then again he was lying down and passed out. sts he has never passed out with the dizziness before but has felt like he might. sts the dizziness comes when he turns his head too fast.

## 2015-08-09 NOTE — ED Notes (Signed)
CBG 92. Tressia Miners, RN notified

## 2015-08-09 NOTE — ED Notes (Signed)
Pt ambulated to restroom with steady gait, NAD 

## 2015-08-09 NOTE — ED Notes (Signed)
Pt remains monitored by blood pressure, pulse ox, and 5 lead. Pt is noted to have visitor at bedside.

## 2015-08-10 ENCOUNTER — Inpatient Hospital Stay: Admission: RE | Admit: 2015-08-10 | Payer: Medicare Other | Source: Ambulatory Visit

## 2015-08-18 ENCOUNTER — Encounter: Payer: Self-pay | Admitting: Internal Medicine

## 2015-08-18 ENCOUNTER — Ambulatory Visit (INDEPENDENT_AMBULATORY_CARE_PROVIDER_SITE_OTHER): Payer: Medicare Other | Admitting: Internal Medicine

## 2015-08-18 VITALS — BP 118/66 | HR 64 | Temp 98.2°F | Ht 73.0 in | Wt 256.5 lb

## 2015-08-18 DIAGNOSIS — R55 Syncope and collapse: Secondary | ICD-10-CM

## 2015-08-18 DIAGNOSIS — R42 Dizziness and giddiness: Secondary | ICD-10-CM

## 2015-08-18 DIAGNOSIS — Z09 Encounter for follow-up examination after completed treatment for conditions other than malignant neoplasm: Secondary | ICD-10-CM | POA: Diagnosis not present

## 2015-08-18 MED ORDER — MECLIZINE HCL 25 MG PO TABS: 25.0000 mg | ORAL_TABLET | Freq: Two times a day (BID) | ORAL | Status: DC

## 2015-08-18 NOTE — Progress Notes (Signed)
Pre visit review using our clinic review tool, if applicable. No additional management support is needed unless otherwise documented below in the visit note. 

## 2015-08-18 NOTE — Progress Notes (Signed)
Subjective:    Patient ID: David Smith, male    DOB: [birth year], 58 y.o.   MRN: [id number]  DOS:  08/18/2015 Type of visit - description :  ER follow-up Interval history: [name] to the ER 08/09/2015 with dizziness and syncope x 2. He reports that he woke up at 4 AM in the morning, went to the bathroom, he started to feel acutely dizzy >> spinning and suddenly collapsed, was unconscious for a split second, injured his face when he fell. At 8 AM, he was lying down, was still feeling mildly dizzy and he again lost consciousness for a split second ("like an on and off kind of thing"). His family was there, second syncope was witnessed >>  not associated with convulsions, postictal state or bladder incontinence. He cont to  feel moderately dizzy. Workup in the ER was negative.  Review of Systems  Denies chest pain, palpitations. No nausea, vomiting. No upper or lower extremity paresthesias. No slurred speech, motor deficits or diplopia     Past Medical History  Diagnosis Date  . Prostate cancer ([place]) 02/06/12    gleason 4+3=7, volume 56.4 cc, PSA 34.25  . Heart murmur     dx as a teen, Stress ECHO 2008 (-)   . Hypertension   . Chronic lower back pain 06-11-12    daily a problem Pain level 7, Tylenol helpful(down to level 2)  . Refusal of blood transfusions as patient is Jehovah's Witness 06-11-12    Pt. desires to sign refusal to permit blood products with exception-to sign 06-17-12  . H/O cardiovascular stress test     non ischemic EF 55% 05-15-2007    Past Surgical History  Procedure Laterality Date  . Achilles tendon repair      26 yrs ago, right foot  . Anal fissurectomy  2008  . Dental procedure  06-11-12    minor -office procedure to correct jaw issue-used anesthesia gas to do  . Robot assisted laparoscopic radical prostatectomy  06/17/2012    Procedure: ROBOTIC ASSISTED LAPAROSCOPIC RADICAL PROSTATECTOMY LEVEL 2;  Surgeon: [name], MD;  Location: [facility];  Service: Urology;   Laterality: N/A;     . Lymphadenectomy  06/17/2012    Procedure: LYMPHADENECTOMY;  Surgeon: [name], MD;  Location: [facility];  Service: Urology;  Laterality: Bilateral;  . Prostate biopsy      Social History   Social History  . Marital Status: Married    Spouse Name: N/A  . Number of Children: N/A  . Years of Education: N/A   Occupational History  . Not on file.   Social History Main Topics  . Smoking status: Never Smoker   . Smokeless tobacco: Never Used  . Alcohol Use: 0.0 oz/week    0 Standard drinks or equivalent per week     Comment: rare   . Drug Use: No  . Sexual Activity: Yes   Other Topics Concern  . Not on file   Social History Narrative        Medication List       This list is accurate as of: 08/18/15  2:22 PM.  Always use your most recent med list.               acetaminophen 325 MG tablet  Commonly known as:  TYLENOL  Take 650 mg by mouth every 6 (six) hours as needed. Pain     aspirin 81 MG tablet  Take 81 mg by mouth daily.  CALCIUM 600 + D PO  Take 600 mg by mouth daily.     leuprolide 11.25 MG injection  Commonly known as:  LUPRON  Inject 11.25 mg into the muscle every 4 (four) months.     lisinopril-hydrochlorothiazide 20-12.5 MG tablet  Commonly known as:  PRINZIDE,ZESTORETIC  Take 2 tablets by mouth daily.     meclizine 25 MG tablet  Commonly known as:  ANTIVERT  Take 1 tablet (25 mg total) by mouth 2 (two) times daily.     sildenafil 100 MG tablet  Commonly known as:  VIAGRA  Take 100 mg by mouth daily as needed for erectile dysfunction. Reported on 06/28/2015     VITAMIN B 12 PO  Take 1 tablet by mouth daily.           Objective:   Physical Exam BP 118/66 mmHg  Pulse 64  Temp(Src) 98.2 F (36.8 C) (Oral)  Ht 6\' 1"  (1.854 m)  Wt 256 lb 8 oz (116.348 kg)  BMI 33.85 kg/m2  SpO2 99% General:   Well developed, well nourished . NAD.  HEENT:  Normocephalic . Face symmetric, atraumatic Lungs:  CTA B Normal  respiratory effort, no intercostal retractions, no accessory muscle use. Heart: RRR,  no murmur.  No pretibial edema bilaterally  Skin: Not pale. Not jaundice Neurologic:  alert & oriented X3.  Speech normal, gait appropriate for age and unassisted. Motor symmetric  Psych--  Cognition and judgment appear intact.  Cooperative with normal attention span and concentration.  Behavior appropriate. No anxious or depressed appearing.      Assessment & Plan:   Assessment HTN Insomnia-- no need for med as off 06-2015 Chronic back pain Prostate cancer, DX 2013, PSA 34.25, s/p prostatectomy, lymphadenectomy.  2014. No transfusions, Jehovah witness Mild anemia, normal iron and ferritin 06-2015 H/o heart murmur, stress echo 2008 negative  PLAN: Chronic dizziness, syncope 2: Recent MRI negative except for some flattening of the cord at the spine level.  He needs further evaluation, given persistent dizziness and lack of CV sx I will start by referring him to neurology, will consider cardiology referral as well. Continue meclizine. I prescribed Topamax the last time he was here but he was unable to afford it. RTC 3 months CPX

## 2015-08-18 NOTE — Assessment & Plan Note (Signed)
Chronic dizziness, syncope 2: Recent MRI negative except for some flattening of the cord at the spine level.  He needs further evaluation, given persistent dizziness and lack of CV sx I will start by referring him to neurology, will consider cardiology referral as well. Continue meclizine. I prescribed Topamax the last time he was here but he was unable to afford it. RTC 3 months CPX

## 2015-08-18 NOTE — Patient Instructions (Signed)
  GO TO THE FRONT DESK Schedule your next appointment for a  Physical exam When?   In 3-4 months Fasting?  Yes

## 2015-08-27 ENCOUNTER — Ambulatory Visit: Payer: Medicare Other | Admitting: Internal Medicine

## 2015-09-09 ENCOUNTER — Ambulatory Visit (INDEPENDENT_AMBULATORY_CARE_PROVIDER_SITE_OTHER): Payer: Medicare Other | Admitting: Neurology

## 2015-09-09 ENCOUNTER — Encounter: Payer: Self-pay | Admitting: Neurology

## 2015-09-09 VITALS — BP 130/86 | HR 84 | Ht 73.0 in | Wt 263.0 lb

## 2015-09-09 DIAGNOSIS — G4453 Primary thunderclap headache: Secondary | ICD-10-CM

## 2015-09-09 DIAGNOSIS — R55 Syncope and collapse: Secondary | ICD-10-CM | POA: Diagnosis not present

## 2015-09-09 DIAGNOSIS — H8110 Benign paroxysmal vertigo, unspecified ear: Secondary | ICD-10-CM | POA: Diagnosis not present

## 2015-09-09 NOTE — Patient Instructions (Signed)
1.  Will get CTA of head and neck 2.  Will contact you with results.  At that point, we will prescribe medication to help prevent the headache 3.  Consider vestibular rehabilitation 4.  Follow up

## 2015-09-09 NOTE — Progress Notes (Signed)
NEUROLOGY CONSULTATION NOTE  CHRISTINE SCHWEITZER MRN: [id number] DOB: [birth year]  Referring provider: Dr. Larose Kells Primary care provider: Dr. Larose Kells  Reason for consult:  Dizziness, syncope  HISTORY OF PRESENT ILLNESS: David Smith is a 60 year old right-handed male with hypertension, chronic back pain, heart murmur and prostate cancer who presents for dizziness and syncope.  History obtained by patient, ED note and PCP note.  Labs, EKG and imaging of brain MRI reviewed.  About 8 years ago, he started experiencing headaches.  Often they would occur out of nowhere, such as while driving.  It was a sudden electric pain in the head associated with vertigo.  It would build up first over a few seconds and the pain would then last a couple of seconds.  He would feel a little heavy in the head with dizziness afterwards.  It occurs once every 4 months.  In addition, he suffers from vertigo, described as spinning.  It would occur often if he stands up from a bent over position.  Other times, it would occur spontaneously.  It lasts a few seconds and resolves.  He also easily gets dizzy, such as while riding a roller coaster or even watching people on a roller coaster.  He denies nausea, vomiting, diplopia.  In early March, he had two episodes of the sudden severe headache, except he also passed out.  At 4 am, he was walking from bed to the bathroom when he noted the vertigo and headache and then passed out for just a couple of seconds.  He did not exhibit convulsions, incontinence, tongue biting or postictal confusion.  Later, he was laying on the couch talking to family when he felt the sudden severe headache and then passed out again.  He went to the ED on 08/09/15 for further evaluation.  CBC and BMP were unremarkable.  Troponins were negative.  MRI of the brain revealed some artifact but no acute stroke.  EKG showed normal sinus rhythm with LAFB.  He was diagnosed with BPPV and discharged with meclizine and  referral for ENT.  PAST MEDICAL HISTORY: Past Medical History  Diagnosis Date  . Prostate cancer ([place]) 02/06/12    gleason 4+3=7, volume 56.4 cc, PSA 34.25  . Heart murmur     dx as a teen, Stress ECHO 2008 (-)   . Hypertension   . Chronic lower back pain 06-11-12    daily a problem Pain level 7, Tylenol helpful(down to level 2)  . Refusal of blood transfusions as patient is Jehovah's Witness 06-11-12    Pt. desires to sign refusal to permit blood products with exception-to sign 06-17-12  . H/O cardiovascular stress test     non ischemic EF 55% 05-15-2007    PAST SURGICAL HISTORY: Past Surgical History  Procedure Laterality Date  . Achilles tendon repair      26 yrs ago, right foot  . Anal fissurectomy  2008  . Dental procedure  06-11-12    minor -office procedure to correct jaw issue-used anesthesia gas to do  . Robot assisted laparoscopic radical prostatectomy  06/17/2012    Procedure: ROBOTIC ASSISTED LAPAROSCOPIC RADICAL PROSTATECTOMY LEVEL 2;  Surgeon: [name], MD;  Location: [facility];  Service: Urology;  Laterality: N/A;     . Lymphadenectomy  06/17/2012    Procedure: LYMPHADENECTOMY;  Surgeon: [name], MD;  Location: [facility];  Service: Urology;  Laterality: Bilateral;  . Prostate biopsy      MEDICATIONS: Current Outpatient Prescriptions on File Prior  to Visit  Medication Sig Dispense Refill  . acetaminophen (TYLENOL) 325 MG tablet Take 650 mg by mouth every 6 (six) hours as needed. Pain    . aspirin 81 MG tablet Take 81 mg by mouth daily.    . Calcium Carb-Cholecalciferol (CALCIUM 600 + D PO) Take 600 mg by mouth daily.    . Cyanocobalamin (VITAMIN B 12 PO) Take 1 tablet by mouth daily.    Marland Kitchen leuprolide (LUPRON) 11.25 MG injection Inject 11.25 mg into the muscle every 4 (four) months.    Marland Kitchen lisinopril-hydrochlorothiazide (PRINZIDE,ZESTORETIC) 20-12.5 MG per tablet Take 2 tablets by mouth daily. 60 tablet 6  . meclizine (ANTIVERT) 25 MG tablet Take 1 tablet (25 mg total) by  mouth 2 (two) times daily. 60 tablet 0  . sildenafil (VIAGRA) 100 MG tablet Take 100 mg by mouth daily as needed for erectile dysfunction. Reported on 06/28/2015     No current facility-administered medications on file prior to visit.    ALLERGIES: No Known Allergies  FAMILY HISTORY: Family History  Problem Relation Age of Onset  . Cancer Mother     breast  . Heart disease Mother   . Cancer Father 51    prostate, removed  . Cancer Brother 56    prostate, tx w/xrt  . Colon cancer Neg Hx   . Colon polyps Neg Hx     SOCIAL HISTORY: Social History   Social History  . Marital Status: Married    Spouse Name: N/A  . Number of Children: N/A  . Years of Education: N/A   Occupational History  . Not on file.   Social History Main Topics  . Smoking status: Never Smoker   . Smokeless tobacco: Never Used  . Alcohol Use: 0.0 oz/week    0 Standard drinks or equivalent per week     Comment: rare   . Drug Use: No  . Sexual Activity: Yes   Other Topics Concern  . Not on file   Social History Narrative    REVIEW OF SYSTEMS: Constitutional: No fevers, chills, or sweats, no generalized fatigue, change in appetite Eyes: No visual changes, double vision, eye pain Ear, nose and throat: No hearing loss, ear pain, nasal congestion, sore throat Cardiovascular: No chest pain, palpitations Respiratory:  No shortness of breath at rest or with exertion, wheezes GastrointestinaI: No nausea, vomiting, diarrhea, abdominal pain, fecal incontinence Genitourinary:  No dysuria, urinary retention or frequency Musculoskeletal:  No neck pain, back pain Integumentary: No rash, pruritus, skin lesions Neurological: as above Psychiatric: No depression, insomnia, anxiety Endocrine: No palpitations, fatigue, diaphoresis, mood swings, change in appetite, change in weight, increased thirst Hematologic/Lymphatic:  No anemia, purpura, petechiae. Allergic/Immunologic: no itchy/runny eyes, nasal congestion,  recent allergic reactions, rashes  PHYSICAL EXAM: Filed Vitals:   09/09/15 0914  BP: 130/86  Pulse: 84   General: No acute distress.  Patient appears well-groomed.  Head:  Normocephalic/atraumatic Eyes:  fundi unremarkable, without vessel changes, exudates, hemorrhages or papilledema. Neck: supple, no paraspinal tenderness, full range of motion Back: No paraspinal tenderness Heart: regular rate and rhythm Lungs: Clear to auscultation bilaterally. Vascular: No carotid bruits. Neurological Exam: Mental status: alert and oriented to person, place, and time, recent and remote memory intact, fund of knowledge intact, attention and concentration intact, speech fluent and not dysarthric, language intact. Cranial nerves: CN I: not tested CN II: pupils equal, round and reactive to light, visual fields intact, fundi unremarkable, without vessel changes, exudates, hemorrhages or papilledema. CN III, IV,  VI:  full range of motion, no nystagmus, no ptosis CN V: facial sensation intact CN VII: upper and lower face symmetric CN VIII: hearing intact CN IX, X: gag intact, uvula midline CN XI: sternocleidomastoid and trapezius muscles intact CN XII: tongue midline Bulk & Tone: normal, no fasciculations. Motor:  5/5 throughout  Sensation:  Pinprick and vibration sensation intact. Deep Tendon Reflexes:  2+ throughout, toes downgoing.  Finger to nose testing:  Without dysmetria.  Heel to shin:  Without dysmetria.  Gait:  Normal station and stride.  Able to turn and tandem walk. Romberg negative.  IMPRESSION: 1.  Consider primary thunderclap headache.  It is not classic presentation.  His headache is fairly sudden onset, but there is a brief build up over several seconds (as oppose to abrupt onset with reaching maximum intensity less than a second) 2.  Peripheral vertigo 3.  Two episodes of syncope.  Both were associated with the headache, which is not usual.  Etiology is unclear. It could be a  vasovagal response to the headache.   PLAN: 1.  Will get CTA of head and neck 2.  After CTA is done, will treat headaches with a preventative 3.  Consider vestibular rehabilitation 4.  Follow up  Thank you for allowing me to take part in the care of this patient.  Metta Clines, DO  CC: [name], MD

## 2015-09-10 DIAGNOSIS — C61 Malignant neoplasm of prostate: Secondary | ICD-10-CM | POA: Diagnosis not present

## 2015-09-10 LAB — PSA: PSA: <0.01

## 2015-09-15 ENCOUNTER — Other Ambulatory Visit: Payer: Medicare Other

## 2015-09-17 ENCOUNTER — Inpatient Hospital Stay: Admission: RE | Admit: 2015-09-17 | Payer: Medicare Other | Source: Ambulatory Visit

## 2015-09-22 DIAGNOSIS — Z0000 Encounter for general adult medical examination without abnormal findings: Secondary | ICD-10-CM | POA: Diagnosis not present

## 2015-09-22 DIAGNOSIS — C61 Malignant neoplasm of prostate: Secondary | ICD-10-CM | POA: Diagnosis not present

## 2015-09-24 ENCOUNTER — Inpatient Hospital Stay: Admission: RE | Admit: 2015-09-24 | Payer: Medicare Other | Source: Ambulatory Visit

## 2015-09-24 ENCOUNTER — Ambulatory Visit
Admission: RE | Admit: 2015-09-24 | Discharge: 2015-09-24 | Disposition: A | Discharge status: Home / Self Care | Payer: Medicare Other | Source: Ambulatory Visit | Attending: Neurology | Admitting: Neurology

## 2015-09-24 ENCOUNTER — Other Ambulatory Visit: Payer: Self-pay | Admitting: *Deleted

## 2015-09-24 DIAGNOSIS — M50222 Other cervical disc displacement at C5-C6 level: Secondary | ICD-10-CM | POA: Diagnosis not present

## 2015-09-24 DIAGNOSIS — R55 Syncope and collapse: Secondary | ICD-10-CM

## 2015-09-24 DIAGNOSIS — G4453 Primary thunderclap headache: Secondary | ICD-10-CM

## 2015-09-24 DIAGNOSIS — R937 Abnormal findings on diagnostic imaging of other parts of musculoskeletal system: Secondary | ICD-10-CM

## 2015-09-24 DIAGNOSIS — R42 Dizziness and giddiness: Secondary | ICD-10-CM | POA: Diagnosis not present

## 2015-09-24 DIAGNOSIS — M50221 Other cervical disc displacement at C4-C5 level: Secondary | ICD-10-CM | POA: Diagnosis not present

## 2015-09-24 MED ORDER — IOPAMIDOL (ISOVUE-370) INJECTION 76%
100.0000 mL | Freq: Once | INTRAVENOUS | Status: AC | PRN
  Administered 2015-09-24: 100 mL via INTRAVENOUS

## 2015-09-27 ENCOUNTER — Telehealth: Payer: Self-pay

## 2015-09-27 NOTE — Telephone Encounter (Signed)
-----   Message from Pieter Partridge, DO sent at 09/27/2015  8:44 AM EDT ----- The MRI of the cervical spine shows a slipped disc pressing into the spinal cord.  I would like him evaluated by neurosurgery as he likely will need surgery.

## 2015-09-27 NOTE — Telephone Encounter (Signed)
Findings look like it has been going on for a while.  However, he didn't demonstrate any worrisome signs on exam while he was in the office.  It may be the cause of headaches but also we want it taken care of so It doesn't get worse.

## 2015-09-27 NOTE — Telephone Encounter (Signed)
Message relayed. Pt also given [facility] contact info.

## 2015-09-27 NOTE — Telephone Encounter (Signed)
Referral sent. Pt would like to know how serious this is. Please advise.

## 2015-09-30 ENCOUNTER — Telehealth: Payer: Self-pay

## 2015-09-30 NOTE — Telephone Encounter (Signed)
Pt scheduled at [place] for 10/05/15 @ 1200

## 2015-10-05 DIAGNOSIS — I10 Essential (primary) hypertension: Secondary | ICD-10-CM | POA: Diagnosis not present

## 2015-10-05 DIAGNOSIS — M4802 Spinal stenosis, cervical region: Secondary | ICD-10-CM | POA: Insufficient documentation

## 2015-10-05 DIAGNOSIS — Z6835 Body mass index (BMI) 35.0-35.9, adult: Secondary | ICD-10-CM | POA: Diagnosis not present

## 2015-10-13 ENCOUNTER — Encounter: Payer: Self-pay | Admitting: Internal Medicine

## 2015-12-28 ENCOUNTER — Ambulatory Visit: Payer: Medicare Other | Admitting: Neurology

## 2016-01-10 ENCOUNTER — Ambulatory Visit (INDEPENDENT_AMBULATORY_CARE_PROVIDER_SITE_OTHER): Payer: Medicare Other | Admitting: Internal Medicine

## 2016-01-10 ENCOUNTER — Encounter: Payer: Self-pay | Admitting: Internal Medicine

## 2016-01-10 VITALS — BP 132/72 | HR 59 | Temp 98.2°F | Resp 14 | Ht 73.0 in | Wt 256.1 lb

## 2016-01-10 DIAGNOSIS — Z114 Encounter for screening for human immunodeficiency virus [HIV]: Secondary | ICD-10-CM

## 2016-01-10 DIAGNOSIS — Z0000 Encounter for general adult medical examination without abnormal findings: Secondary | ICD-10-CM

## 2016-01-10 DIAGNOSIS — R739 Hyperglycemia, unspecified: Secondary | ICD-10-CM | POA: Diagnosis not present

## 2016-01-10 DIAGNOSIS — I10 Essential (primary) hypertension: Secondary | ICD-10-CM

## 2016-01-10 DIAGNOSIS — G9520 Unspecified cord compression: Secondary | ICD-10-CM | POA: Diagnosis not present

## 2016-01-10 LAB — BASIC METABOLIC PANEL
BUN: 15 mg/dL (ref 6–23)
CO2: 28 mEq/L (ref 19–32)
Calcium: 9.2 mg/dL (ref 8.4–10.5)
Chloride: 107 mEq/L (ref 96–112)
Creatinine, Ser: 1.42 mg/dL (ref 0.40–1.50)
GFR: 65.85 mL/min (ref >60.00)
Glucose, Bld: 91 mg/dL (ref 70–99)
Potassium: 3.7 mEq/L (ref 3.5–5.1)
Sodium: 141 mEq/L (ref 135–145)

## 2016-01-10 LAB — LIPID PANEL
Cholesterol: 166 mg/dL (ref 0–200)
HDL: 34.8 mg/dL — ABNORMAL LOW (ref >39.00)
LDL Cholesterol: 119 mg/dL — ABNORMAL HIGH (ref 0–99)
NonHDL: 130.71
Total CHOL/HDL Ratio: 5
Triglycerides: 59 mg/dL (ref 0.0–149.0)
VLDL: 11.8 mg/dL (ref 0.0–40.0)

## 2016-01-10 LAB — HEMOGLOBIN A1C: Hgb A1c MFr Bld: 6 % (ref 4.6–6.5)

## 2016-01-10 LAB — ALT: ALT: 21 U/L (ref 0–53)

## 2016-01-10 LAB — AST: AST: 18 U/L (ref 0–37)

## 2016-01-10 NOTE — Progress Notes (Signed)
Subjective:    Patient ID: David Smith, male    DOB: 1956-04-20, 60 y.o.   MRN: [id number]  DOS:  01/10/2016 Type of visit - description : cpx Here for Medicare AWV:  1. Risk factors based on Past M, S, F history: reviewed 2. Physical Activities: active at home, some biking   3. Depression/mood: neg screening (was depress while on lupron) 4. Hearing:  No problems noted or reported  5. ADL's: independent, drives  6. Fall Risk: no recent falls, prevention discussed , see AVS 7. home Safety: does feel safe at home  8. Height, weight, & visual acuity: see VS, sees eye doctor regulalrly 9. Counseling: provided 10. Labs ordered based on risk factors: if needed  11. Referral Coordination: if needed 12. Care Plan, see assessment and plan , written personalized plan provided , see AVS 13. Cognitive Assessment: motor skills and cognition appropriate for age 40. Care team updated  15. End-of-life care: discussed HC POA  In addition, today we discussed the following:  HTN: Good compliance of medications Recent syncope, headache, dizziness: No further syncope, extensive workup, notes from neurology and neurosurgery reviewed.    Review of Systems  Constitutional: No fever. No chills. No unexplained wt changes. No unusual sweats  HEENT: No dental problems, no ear discharge, no facial swelling, no voice changes. No eye discharge, no eye  redness , no  intolerance to light   Respiratory: No wheezing , no  difficulty breathing. No cough , no mucus production  Cardiovascular: No CP, no leg swelling , no  Palpitations  GI: no nausea, no vomiting, no diarrhea , no  abdominal pain.  No blood in the stools. No dysphagia, no odynophagia    Endocrine: No polyphagia, no polyuria , no polydipsia  GU: No dysuria, gross hematuria, difficulty urinating. No urinary urgency, no frequency.  Musculoskeletal: No joint swellings or unusual aches or pains  Skin: No change in the color of the skin,  palor , no  Rash  Allergic, immunologic: No environmental allergies , no  food allergies  Neurological: Dizziness and headaches have definitely improved.  No diplopia, no slurred, no slurred speech, no motor deficits, no facial  Numbness  Hematological: No enlarged lymph nodes, no easy bruising , no unusual bleedings  Psychiatry: No suicidal ideas, no hallucinations, no beavior problems, no confusion.  No unusual/severe anxiety, no depression    Past Medical History:  Diagnosis Date  . Chronic lower back pain 06-11-12   daily a problem Pain level 7, Tylenol helpful(down to level 2)  . H/O cardiovascular stress test    non ischemic EF 55% 05-15-2007  . Heart murmur    dx as a teen, Stress ECHO 2008 (-)   . Hypertension   . Prostate cancer ([place]) 02/06/12   gleason 4+3=7, volume 56.4 cc, PSA 34.25    Past Surgical History:  Procedure Laterality Date  . ACHILLES TENDON REPAIR     26 yrs ago, right foot  . ANAL FISSURECTOMY  2008  . dental procedure  06-11-12   minor -office procedure to correct jaw issue-used anesthesia gas to do  . LYMPHADENECTOMY  06/17/2012   Procedure: LYMPHADENECTOMY;  Surgeon: [name], MD;  Location: [facility];  Service: Urology;  Laterality: Bilateral;  . PROSTATE BIOPSY    . ROBOT ASSISTED LAPAROSCOPIC RADICAL PROSTATECTOMY  06/17/2012   Procedure: ROBOTIC ASSISTED LAPAROSCOPIC RADICAL PROSTATECTOMY LEVEL 2;  Surgeon: [name], MD;  Location: [facility];  Service: Urology;  Laterality: N/A;  Social History   Social History  . Marital status: Married    Spouse name: N/A  . Number of children: 3  . Years of education: N/A   Occupational History  . disability d/t back pain-not working at present    Social History Main Topics  . Smoking status: Never Smoker  . Smokeless tobacco: Never Used  . Alcohol use 0.0 oz/week     Comment: rare   . Drug use: No  . Sexual activity: Yes   Other Topics Concern  . Not on file   Social History Narrative  .  No narrative on file     Family History  Problem Relation Age of Onset  . Cancer Mother     breast  . Heart disease Mother   . Cancer Father 76    prostate, removed  . Cancer Brother 91    prostate, tx w/xrt  . Colon cancer Neg Hx   . Colon polyps Neg Hx         Medication List       Accurate as of 01/10/16  5:13 PM. Always use your most recent med list.          acetaminophen 325 MG tablet Commonly known as:  TYLENOL Take 650 mg by mouth every 6 (six) hours as needed. Pain   aspirin 81 MG tablet Take 81 mg by mouth daily.   CALCIUM 600 + D PO Take 600 mg by mouth daily.   lisinopril-hydrochlorothiazide 20-12.5 MG tablet Commonly known as:  PRINZIDE,ZESTORETIC Take 2 tablets by mouth daily.   meclizine 25 MG tablet Commonly known as:  ANTIVERT Take 1 tablet (25 mg total) by mouth 2 (two) times daily.   sildenafil 100 MG tablet Commonly known as:  VIAGRA Take 100 mg by mouth daily as needed for erectile dysfunction. Reported on 06/28/2015   VITAMIN B 12 PO Take 1 tablet by mouth daily.          Objective:   Physical Exam BP 132/72 (BP Location: Left Arm, Patient Position: Sitting, Cuff Size: Normal)   Pulse (!) 59   Temp 98.2 F (36.8 C) (Oral)   Resp 14   Ht 6\' 1"  (1.854 m)   Wt 256 lb 2 oz (116.2 kg)   SpO2 98%   BMI 33.79 kg/m   General:   Well developed, well nourished . NAD.  Neck: No  thyromegaly  HEENT:  Normocephalic . Face symmetric, atraumatic Lungs:  CTA B Normal respiratory effort, no intercostal retractions, no accessory muscle use. Heart: RRR,  no murmur.  No pretibial edema bilaterally  Abdomen:  Not distended, soft, non-tender. No rebound or rigidity.   Skin: Exposed areas without rash. Not pale. Not jaundice Neurologic:  alert & oriented X3.  Speech normal, gait appropriate for age and unassisted Strength symmetric and appropriate for age.  Psych: Cognition and judgment appear intact.  Cooperative with normal  attention span and concentration.  Behavior appropriate. No anxious or depressed appearing.    Assessment & Plan:    Assessment HTN Insomnia-- no need for med as off 06-2015 Chronic back pain GU: ---Prostate cancer, DX 2013, PSA 34.25, s/p prostatectomy, lymphadenectomy 2014. ---Hematuria- CT -cysto 2016 per urology-- likely d/t h/o XRT No transfusions, Jehovah witness Mild anemia, normal iron and ferritin 06-2015 H/o heart murmur, stress echo 2008 negative Syncope, dizziness  headaches: Saw neurology 4-17,w/u done, CTA head, neck, then neck MRI: dx w/ spinal compression, saw surgery 10-2015: rx non emergent surgery, finding was  not causing HAs-dizzines   PLAN: HTN: Continue lisinopril, HCTZ. Check a BMP, FLP, AST, ALT Chronic dizziness, syncope 2, headaches: Saw neurology 4-17,w/u done, CTA head, neck (neg), then neck MRI: dx w/ spinal compression, saw surgery 10-2015: rx non emergent surgery, finding was not causing HAs-dizzines .Eventually  neurology recommended no further treatment for dizziness and headaches except meclizine and avoidance of certain postures. He is doing better. RTC 8 months.

## 2016-01-10 NOTE — Progress Notes (Signed)
Pre visit review using our clinic review tool, if applicable. No additional management support is needed unless otherwise documented below in the visit note. 

## 2016-01-10 NOTE — Assessment & Plan Note (Signed)
HTN: Continue lisinopril, HCTZ. Check a BMP, FLP, AST, ALT Chronic dizziness, syncope 2, headaches: Saw neurology 4-17,w/u done, CTA head, neck (neg), then neck MRI: dx w/ spinal compression, saw surgery 10-2015: rx non emergent surgery, finding was not causing HAs-dizzines .Eventually  neurology recommended no further treatment for dizziness and headaches except meclizine and avoidance of certain postures. He is doing better. RTC 8 months.

## 2016-01-10 NOTE — Patient Instructions (Signed)
Get your blood work before you leave  Next visit 8 months    Fall Prevention and [place] cause injuries and can affect all age groups. It is possible to use preventive measures to significantly decrease the likelihood of falls. There are many simple measures which can make your home safer and prevent falls. OUTDOORS  Repair cracks and edges of walkways and driveways.  Remove high doorway thresholds.  Trim shrubbery on the main path into your home.  Have good outside lighting.  Clear walkways of tools, rocks, debris, and clutter.  Check that handrails are not broken and are securely fastened. Both sides of steps should have handrails.  Have leaves, snow, and ice cleared regularly.  Use sand or salt on walkways during winter months.  In the garage, clean up grease or oil spills. BATHROOM  Install night lights.  Install grab bars by the toilet and in the tub and shower.  Use non-skid mats or decals in the tub or shower.  Place a plastic non-slip stool in the shower to sit on, if needed.  Keep floors dry and clean up all water on the floor immediately.  Remove soap buildup in the tub or shower on a regular basis.  Secure bath mats with non-slip, double-sided rug tape.  Remove throw rugs and tripping hazards from the floors. BEDROOMS  Install night lights.  Make sure a bedside light is easy to reach.  Do not use oversized bedding.  Keep a telephone by your bedside.  Have a firm chair with side arms to use for getting dressed.  Remove throw rugs and tripping hazards from the floor. KITCHEN  Keep handles on pots and pans turned toward the center of the stove. Use back burners when possible.  Clean up spills quickly and allow time for drying.  Avoid walking on wet floors.  Avoid hot utensils and knives.  Position shelves so they are not too high or low.  Place commonly used objects within easy reach.  If necessary, use a sturdy step stool with a  grab bar when reaching.  Keep electrical cables out of the way.  Do not use floor polish or wax that makes floors slippery. If you must use wax, use non-skid floor wax.  Remove throw rugs and tripping hazards from the floor. STAIRWAYS  Never leave objects on stairs.  Place handrails on both sides of stairways and use them. Fix any loose handrails. Make sure handrails on both sides of the stairways are as long as the stairs.  Check carpeting to make sure it is firmly attached along stairs. Make repairs to worn or loose carpet promptly.  Avoid placing throw rugs at the top or bottom of stairways, or properly secure the rug with carpet tape to prevent slippage. Get rid of throw rugs, if possible.  Have an electrician put in a light switch at the top and bottom of the stairs. OTHER FALL PREVENTION TIPS  Wear low-heel or rubber-soled shoes that are supportive and fit well. Wear closed toe shoes.  When using a stepladder, make sure it is fully opened and both spreaders are firmly locked. Do not climb a closed stepladder.  Add color or contrast paint or tape to grab bars and handrails in your home. Place contrasting color strips on first and last steps.  Learn and use mobility aids as needed. Install an electrical emergency response system.  Turn on lights to avoid dark areas. Replace light bulbs that burn out immediately. Get light switches  light switches that glow.  Arrange furniture to create clear pathways. Keep furniture in the same place.  Firmly attach carpet with non-skid or double-sided tape.  Eliminate uneven floor surfaces.  Select a carpet pattern that does not visually hide the edge of steps.  Be aware of all pets. OTHER HOME SAFETY TIPS  Set the water temperature for 120 F (48.8 C).  Keep emergency numbers on or near the telephone.  Keep smoke detectors on every level of the home and near sleeping areas. Document Released: 05/12/2002 Document Revised: 11/21/2011  Document Reviewed: 08/11/2011 ExitCare Patient Information 2015 [employer]. This information is not intended to replace advice given to you by your health care provider. Make sure you discuss any questions you have with your health care provider.   Preventive Care for Adults Ages 65 and over  Blood pressure check.** / Every 1 to 2 years.  Lipid and cholesterol check.**/ Every 5 years beginning at age 20.  Lung cancer screening. / Every year if you are aged 55-80 years and have a 30-pack-year history of smoking and currently smoke or have quit within the past 15 years. Yearly screening is stopped once you have quit smoking for at least 15 years or develop a health problem that would prevent you from having lung cancer treatment.  Fecal occult blood test (FOBT) of stool. / Every year beginning at age 50 and continuing until age 75. You may not have to do this test if you get a colonoscopy every 10 years.  Flexible sigmoidoscopy** or colonoscopy.** / Every 5 years for a flexible sigmoidoscopy or every 10 years for a colonoscopy beginning at age 50 and continuing until age 75.  Hepatitis C blood test.** / For all people born from 1945 through 1965 and any individual with known risks for hepatitis C.  Abdominal aortic aneurysm (AAA) screening.** / A one-time screening for ages 65 to 75 years who are current or former smokers.  Skin self-exam. / Monthly.  Influenza vaccine. / Every year.  Tetanus, diphtheria, and acellular pertussis (Tdap/Td) vaccine.** / 1 dose of Td every 10 years.  Varicella vaccine.** / Consult your health care provider.  Zoster vaccine.** / 1 dose for adults aged 60 years or older.  Pneumococcal 13-valent conjugate (PCV13) vaccine.** / Consult your health care provider.  Pneumococcal polysaccharide (PPSV23) vaccine.** / 1 dose for all adults aged 65 years and older.  Meningococcal vaccine.** / Consult your health care provider.  Hepatitis A vaccine.** /  Consult your health care provider.  Hepatitis B vaccine.** / Consult your health care provider.  Haemophilus influenzae type b (Hib) vaccine.** / Consult your health care provider. **Family history and personal history of risk and conditions may change your health care provider's recommendations. Document Released: 07/18/2001 Document Revised: 05/27/2013 Document Reviewed: 10/17/2010 ExitCare Patient Information 2015 [employer]. This information is not intended to replace advice given to you by your health care provider. Make sure you discuss any questions you have with your health care provider.   

## 2016-01-10 NOTE — Assessment & Plan Note (Addendum)
Tdap-- 2013   CCS: scope 07-2015, +polyps, 5 years per GI letter  H/o prostate ca   Diet and exercise discussed Labs: BMP, AST, ALT, FLP, A1c due to mild hyperglycemia

## 2016-01-19 DIAGNOSIS — C61 Malignant neoplasm of prostate: Secondary | ICD-10-CM | POA: Diagnosis not present

## 2016-01-19 DIAGNOSIS — C775 Secondary and unspecified malignant neoplasm of intrapelvic lymph nodes: Secondary | ICD-10-CM | POA: Diagnosis not present

## 2016-02-02 ENCOUNTER — Other Ambulatory Visit: Payer: Self-pay | Admitting: Internal Medicine

## 2016-02-04 ENCOUNTER — Telehealth: Payer: Self-pay | Admitting: Internal Medicine

## 2016-02-04 NOTE — Telephone Encounter (Signed)
Lisinopril was refilled to pharmacy on 02/02/2016.

## 2016-02-04 NOTE — Telephone Encounter (Signed)
Self, refill request for lisinopril    Pharmacy: [place], [place] - [address]

## 2016-03-27 ENCOUNTER — Telehealth: Payer: Self-pay | Admitting: Family Medicine

## 2016-03-27 ENCOUNTER — Other Ambulatory Visit: Payer: Self-pay | Admitting: Internal Medicine

## 2016-03-27 ENCOUNTER — Ambulatory Visit (INDEPENDENT_AMBULATORY_CARE_PROVIDER_SITE_OTHER): Payer: Medicare Other | Admitting: Physician Assistant

## 2016-03-27 VITALS — BP 116/74 | HR 72 | Temp 99.3°F | Resp 18 | Ht 73.0 in | Wt 252.0 lb

## 2016-03-27 DIAGNOSIS — H04123 Dry eye syndrome of bilateral lacrimal glands: Secondary | ICD-10-CM | POA: Diagnosis not present

## 2016-03-27 DIAGNOSIS — R21 Rash and other nonspecific skin eruption: Secondary | ICD-10-CM

## 2016-03-27 DIAGNOSIS — B0239 Other herpes zoster eye disease: Secondary | ICD-10-CM | POA: Diagnosis not present

## 2016-03-27 DIAGNOSIS — H2513 Age-related nuclear cataract, bilateral: Secondary | ICD-10-CM | POA: Diagnosis not present

## 2016-03-27 DIAGNOSIS — H11153 Pinguecula, bilateral: Secondary | ICD-10-CM | POA: Diagnosis not present

## 2016-03-27 MED ORDER — VALACYCLOVIR HCL 1 G PO TABS: 1000.0000 mg | ORAL_TABLET | Freq: Three times a day (TID) | ORAL | 0 refills | Status: DC

## 2016-03-27 MED ORDER — CEPHALEXIN 500 MG PO CAPS: 500.0000 mg | ORAL_CAPSULE | Freq: Three times a day (TID) | ORAL | 0 refills | Status: DC

## 2016-03-27 NOTE — Telephone Encounter (Signed)
Dr Patrici Ranks office wanted office notes from today I fax over notes to [phone]

## 2016-03-27 NOTE — Progress Notes (Signed)
03/27/2016 1:20 PM   DOB: August 12, 1955 / MRN: [id number]  SUBJECTIVE:  David Smith is a 60 y.o. male presenting for rash about the right sided forehead that starte about 36 hours ago.  Reports that rash started as a tingle, then progressed to redness.  He associates pain and a mild fever.  He denies frank eye pain and vision changes.  He recently lost his sister in law and this was stressful to him.   He has No Known Allergies.   He  has a past medical history of Chronic lower back pain (06-11-12); H/O cardiovascular stress test; Heart murmur; Hypertension; Prostate cancer ([place]) (02/06/12); and Prostate cancer metastatic to intrapelvic lymph node ([place]).    He  reports that he has never smoked. He has never used smokeless tobacco. He reports that he drinks alcohol. He reports that he does not use drugs. He  reports that he currently engages in sexual activity. The patient  has a past surgical history that includes Achilles tendon repair; Anal fissurectomy (2008); dental procedure (06-11-12); Robot assisted laparoscopic radical prostatectomy (06/17/2012); Lymphadenectomy (06/17/2012); ostate biopsy; and ostate surgery.  His family history includes Cancer in his mother; Cancer (age of onset: 59) in his brother; Cancer (age of onset: 8) in his father; Heart disease in his mother.  Review of Systems  Constitutional: Positive for chills and fever.  Eyes: Negative for blurred vision, double vision, photophobia, pain, discharge and redness.  Respiratory: Negative for cough.   Cardiovascular: Negative for chest pain.  Gastrointestinal: Negative for nausea.  Skin: Positive for rash. Negative for itching.  Neurological: Positive for headaches. Negative for dizziness.    The problem list and medications were reviewed and updated by myself where necessary and exist elsewhere in the encounter.   OBJECTIVE:  BP 116/74 (BP Location: Right Arm, Patient Position: Sitting, Cuff Size: Small)   Pulse 72   Temp  99.3 F (37.4 C) (Oral)   Resp 18   Ht 6\' 1"  (1.854 m)   Wt 252 lb (114.3 kg)   SpO2 99%   BMI 33.25 kg/m   Physical Exam  Constitutional: He is oriented to person, place, and time. He appears well-developed and well-nourished. No distress.  HENT:  Head:    Cardiovascular: Normal rate, regular rhythm and normal heart sounds.  Exam reveals no gallop.   No murmur heard. Pulmonary/Chest: Effort normal and breath sounds normal.  Musculoskeletal: Normal range of motion.  Neurological: He is alert and oriented to person, place, and time. No cranial nerve deficit.  Skin: Skin is warm and dry. He is not diaphoretic.  Psychiatric: He has a normal mood and affect.    No results found for this or any previous visit (from the past 72 hour(s)).  No results found.  Lab Results  Component Value Date   CREATININE 1.42 01/10/2016     ASSESSMENT AND PLAN  David Smith was seen today for herpes zoster.  Diagnoses and all orders for this visit:  Rash and nonspecific skin eruption: High clinical suspicion  For shingles. Advised he go see an ophthalmologist now.  He is sceduled to see Dr. Katy Fitch at 3:30 today.  He will go ahead and start Valtrex and I am starting keflex to cover for a secondary bacterial infection.  Last GFR was normal.  -     valACYclovir (VALTREX) 1000 MG tablet; Take 1 tablet (1,000 mg total) by mouth 3 (three) times daily. -     cephALEXin (KEFLEX) 500 MG capsule; Take  1 capsule (500 mg total) by mouth 3 (three) times daily.  Other orders -     Cancel: Flu Vaccine QUAD 36+ mos IM    The patient is advised to call or return to clinic if he does not see an improvement in symptoms, or to seek the care of the closest emergency department if he worsens with the above plan.   Philis Fendt, MHS, PA-C [facility] 03/27/2016 1:20 PM

## 2016-03-27 NOTE — Patient Instructions (Addendum)
Please arrive at Dr. Zenia Resides office at [address] at 3:00 today for a 3:15pm appointment.  Please bring your ID and Insurance card.      IF you received an x-ray today, you will receive an invoice from [facility]. Please contact [facility] at [phone] with questions or concerns regarding your invoice.   IF you received labwork today, you will receive an invoice from [employer]. Please contact [facility] at [phone] with questions or concerns regarding your invoice.   Our billing staff will not be able to assist you with questions regarding bills from these companies.  You will be contacted with the lab results as soon as they are available. The fastest way to get your results is to activate your My Chart account. Instructions are located on the last page of this paperwork. If you have not heard from [place] regarding the results in 2 weeks, please contact this office.

## 2016-03-30 ENCOUNTER — Telehealth: Payer: Self-pay

## 2016-03-30 LAB — WOUND CULTURE
Gram Stain: NONE SEEN
Gram Stain: NONE SEEN

## 2016-03-30 NOTE — Progress Notes (Signed)
PATIENT ADVISED

## 2016-03-30 NOTE — Progress Notes (Signed)
Culture shows pansensitivity.  Please call and make aware that he continue and complete both medications.

## 2016-03-30 NOTE — Telephone Encounter (Signed)
PATIENT ADVISED

## 2016-03-30 NOTE — Telephone Encounter (Signed)
-----   Message from Tereasa Coop, PA-C sent at 03/30/2016 10:29 AM EDT ----- Culture shows pansensitivity.  Please call and make aware that he continue and complete both medications.

## 2016-04-03 DIAGNOSIS — H04123 Dry eye syndrome of bilateral lacrimal glands: Secondary | ICD-10-CM | POA: Diagnosis not present

## 2016-04-03 DIAGNOSIS — H11153 Pinguecula, bilateral: Secondary | ICD-10-CM | POA: Diagnosis not present

## 2016-04-03 DIAGNOSIS — H2513 Age-related nuclear cataract, bilateral: Secondary | ICD-10-CM | POA: Diagnosis not present

## 2016-04-10 DIAGNOSIS — H11153 Pinguecula, bilateral: Secondary | ICD-10-CM | POA: Diagnosis not present

## 2016-04-10 DIAGNOSIS — H2513 Age-related nuclear cataract, bilateral: Secondary | ICD-10-CM | POA: Diagnosis not present

## 2016-04-10 DIAGNOSIS — H04123 Dry eye syndrome of bilateral lacrimal glands: Secondary | ICD-10-CM | POA: Diagnosis not present

## 2016-04-21 DIAGNOSIS — C61 Malignant neoplasm of prostate: Secondary | ICD-10-CM | POA: Diagnosis not present

## 2016-04-24 DIAGNOSIS — H2513 Age-related nuclear cataract, bilateral: Secondary | ICD-10-CM | POA: Diagnosis not present

## 2016-04-24 DIAGNOSIS — H04123 Dry eye syndrome of bilateral lacrimal glands: Secondary | ICD-10-CM | POA: Diagnosis not present

## 2016-04-24 DIAGNOSIS — H11153 Pinguecula, bilateral: Secondary | ICD-10-CM | POA: Diagnosis not present

## 2016-06-09 DIAGNOSIS — Z23 Encounter for immunization: Secondary | ICD-10-CM | POA: Diagnosis not present

## 2016-07-14 DIAGNOSIS — C61 Malignant neoplasm of prostate: Secondary | ICD-10-CM | POA: Diagnosis not present

## 2016-09-12 ENCOUNTER — Encounter: Payer: Self-pay | Admitting: Internal Medicine

## 2016-09-12 ENCOUNTER — Ambulatory Visit (INDEPENDENT_AMBULATORY_CARE_PROVIDER_SITE_OTHER): Payer: Medicare Other | Admitting: Internal Medicine

## 2016-09-12 VITALS — BP 124/78 | HR 56 | Temp 98.0°F | Resp 14 | Ht 73.0 in | Wt 249.5 lb

## 2016-09-12 DIAGNOSIS — R739 Hyperglycemia, unspecified: Secondary | ICD-10-CM

## 2016-09-12 DIAGNOSIS — Z1159 Encounter for screening for other viral diseases: Secondary | ICD-10-CM | POA: Diagnosis not present

## 2016-09-12 DIAGNOSIS — I10 Essential (primary) hypertension: Secondary | ICD-10-CM | POA: Diagnosis not present

## 2016-09-12 LAB — HEMOGLOBIN A1C: Hgb A1c MFr Bld: 6 % (ref 4.6–6.5)

## 2016-09-12 LAB — BASIC METABOLIC PANEL
BUN: 16 mg/dL (ref 6–23)
CO2: 26 mEq/L (ref 19–32)
Calcium: 9.4 mg/dL (ref 8.4–10.5)
Chloride: 104 mEq/L (ref 96–112)
Creatinine, Ser: 1.31 mg/dL (ref 0.40–1.50)
GFR: 72.1 mL/min (ref >60.00)
Glucose, Bld: 88 mg/dL (ref 70–99)
Potassium: 3.9 mEq/L (ref 3.5–5.1)
Sodium: 138 mEq/L (ref 135–145)

## 2016-09-12 LAB — HEPATITIS C ANTIBODY: HCV Ab: NEGATIVE

## 2016-09-12 NOTE — Patient Instructions (Signed)
GO TO THE LAB : Get the blood work     GO TO THE FRONT DESK Schedule your next appointment for a  Physical exam in 4-5 months

## 2016-09-12 NOTE — Assessment & Plan Note (Signed)
Hyperglycemia: Last A1c 6.0, recheck a A1c. Encourage to stay active and eat healthy. HTN: Seems control with Zestoretic. Check a BMP Shingles: Recovered. We'll discuss immunizations on RTC. Back pain: Chronic, and Tylenol. Screening for hep C. Declined HIV.  RTC 4-5 months CPX

## 2016-09-12 NOTE — Progress Notes (Signed)
Pre visit review using our clinic review tool, if applicable. No additional management support is needed unless otherwise documented below in the visit note. 

## 2016-09-12 NOTE — Progress Notes (Signed)
Subjective:    Patient ID: David Smith, male    DOB: 14-Oct-1955, 61 y.o.   MRN: [id number]  DOS:  09/12/2016 Type of visit - description : rov Interval history: HTN: Good medication compliance, not ambulatory BPs Back pain: Chronic, and Tylenol only. Had shingles, status post Valtrex and Keflex, recovering well. He just reports a dry eye, has seen ophthalmology.    Review of Systems   Past Medical History:  Diagnosis Date  . Chronic lower back pain 06-11-12   daily a problem Pain level 7, Tylenol helpful(down to level 2)  . H/O cardiovascular stress test    non ischemic EF 55% 05-15-2007  . Heart murmur    dx as a teen, Stress ECHO 2008 (-)   . Hypertension   . Prostate cancer ([place]) 02/06/12   gleason 4+3=7, volume 56.4 cc, PSA 34.25  . Prostate cancer metastatic to intrapelvic lymph node [facility])     Past Surgical History:  Procedure Laterality Date  . ACHILLES TENDON REPAIR     26 yrs ago, right foot  . ANAL FISSURECTOMY  2008  . dental procedure  06-11-12   minor -office procedure to correct jaw issue-used anesthesia gas to do  . LYMPHADENECTOMY  06/17/2012   Procedure: LYMPHADENECTOMY;  Surgeon: [name], MD;  Location: [facility];  Service: Urology;  Laterality: Bilateral;  . PROSTATE BIOPSY    . PROSTATE SURGERY    . ROBOT ASSISTED LAPAROSCOPIC RADICAL PROSTATECTOMY  06/17/2012   Procedure: ROBOTIC ASSISTED LAPAROSCOPIC RADICAL PROSTATECTOMY LEVEL 2;  Surgeon: [name], MD;  Location: [facility];  Service: Urology;  Laterality: N/A;       Social History   Social History  . Marital status: Married    Spouse name: N/A  . Number of children: 3  . Years of education: N/A   Occupational History  . disability d/t back pain-not working at present    Social History Main Topics  . Smoking status: Never Smoker  . Smokeless tobacco: Never Used  . Alcohol use 0.0 oz/week     Comment: rare   . Drug use: No  . Sexual activity: Yes   Other Topics Concern  . Not on file     Social History Narrative  . No narrative on file      Allergies as of 09/12/2016   No Known Allergies     Medication List       Accurate as of 09/12/16  6:06 PM. Always use your most recent med list.          acetaminophen 325 MG tablet Commonly known as:  TYLENOL Take 650 mg by mouth every 6 (six) hours as needed. Pain   aspirin 81 MG tablet Take 81 mg by mouth daily.   CALCIUM 600 + D PO Take 600 mg by mouth daily.   lisinopril-hydrochlorothiazide 20-12.5 MG tablet Commonly known as:  PRINZIDE,ZESTORETIC Take 2 tablets by mouth daily.   meclizine 25 MG tablet Commonly known as:  ANTIVERT Take 1 tablet (25 mg total) by mouth 2 (two) times daily as needed.   sildenafil 100 MG tablet Commonly known as:  VIAGRA Take 100 mg by mouth daily as needed for erectile dysfunction. Reported on 06/28/2015   VITAMIN B 12 PO Take 1 tablet by mouth daily.          Objective:   Physical Exam BP 124/78 (BP Location: Left Arm, Patient Position: Sitting, Cuff Size: Normal)   Pulse (!) 56   Temp  98 F (36.7 C) (Oral)   Resp 14   Ht 6\' 1"  (1.854 m)   Wt 249 lb 8 oz (113.2 kg)   SpO2 97%   BMI 32.92 kg/m  General:   Well developed, well nourished . NAD.  HEENT:  Normocephalic . Face symmetric, atraumatic. EOMI, pupils equal and reactive. Conjunctiva is normal. Lungs:  CTA B Normal respiratory effort, no intercostal retractions, no accessory muscle use. Heart: RRR,  no murmur.  No pretibial edema bilaterally  Skin: Not pale. Not jaundice Neurologic:  alert & oriented X3.  Speech normal, gait appropriate for age and unassisted Face symmetric Psych--  Cognition and judgment appear intact.  Cooperative with normal attention span and concentration.  Behavior appropriate. No anxious or depressed appearing.      Assessment & Plan:   Assessment hyperglycemia: 01-2016 A1C 6.0 HTN Insomnia-- no need for med as off 06-2015 Chronic back pain, offered surgery at some  point, declined  GU: ---Prostate cancer, DX 2013, PSA 34.25, s/p prostatectomy, lymphadenectomy 2014.XRT ---Hematuria- CT -cysto 2016 per urology-- likely d/t h/o XRT No transfusions, Jehovah witness Mild anemia, normal iron and ferritin 06-2015 H/o heart murmur, stress echo 2008 negative Syncope, dizziness  headaches: Saw neurology 4-17,w/u done, CTA head, neck, then neck MRI: dx w/ spinal compression, saw surgery 10-2015: rx non emergent surgery, finding was not causing HAs-dizzines  Shingles, R face, 03-2016, saw ophtalmology  PLAN: Hyperglycemia: Last A1c 6.0, recheck a A1c. Encourage to stay active and eat healthy. HTN: Seems control with Zestoretic. Check a BMP Shingles: Recovered. We'll discuss immunizations on RTC. Back pain: Chronic, and Tylenol. Screening for hep C. Declined HIV.  RTC 4-5 months CPX

## 2016-10-17 DIAGNOSIS — C61 Malignant neoplasm of prostate: Secondary | ICD-10-CM | POA: Diagnosis not present

## 2016-10-17 LAB — PSA: PSA: 0.099

## 2016-10-24 DIAGNOSIS — C61 Malignant neoplasm of prostate: Secondary | ICD-10-CM | POA: Diagnosis not present

## 2016-10-24 DIAGNOSIS — C775 Secondary and unspecified malignant neoplasm of intrapelvic lymph nodes: Secondary | ICD-10-CM | POA: Diagnosis not present

## 2016-10-24 DIAGNOSIS — N5201 Erectile dysfunction due to arterial insufficiency: Secondary | ICD-10-CM | POA: Diagnosis not present

## 2016-10-31 ENCOUNTER — Encounter: Payer: Self-pay | Admitting: Internal Medicine

## 2016-12-29 ENCOUNTER — Other Ambulatory Visit: Payer: Self-pay

## 2016-12-29 MED ORDER — LISINOPRIL-HYDROCHLOROTHIAZIDE 20-12.5 MG PO TABS: 2.0000 | ORAL_TABLET | Freq: Every day | ORAL | 0 refills | Status: DC

## 2017-01-12 ENCOUNTER — Encounter: Payer: Self-pay | Admitting: Internal Medicine

## 2017-01-12 ENCOUNTER — Ambulatory Visit (INDEPENDENT_AMBULATORY_CARE_PROVIDER_SITE_OTHER): Payer: Medicare Other | Admitting: Internal Medicine

## 2017-01-12 VITALS — BP 124/70 | HR 60 | Temp 98.2°F | Resp 14 | Ht 73.0 in | Wt 249.4 lb

## 2017-01-12 DIAGNOSIS — G9520 Unspecified cord compression: Secondary | ICD-10-CM | POA: Diagnosis not present

## 2017-01-12 DIAGNOSIS — I10 Essential (primary) hypertension: Secondary | ICD-10-CM

## 2017-01-12 DIAGNOSIS — E785 Hyperlipidemia, unspecified: Secondary | ICD-10-CM | POA: Diagnosis not present

## 2017-01-12 LAB — CBC WITH DIFFERENTIAL/PLATELET
Basophils Absolute: 0 cells/uL (ref 0–200)
Basophils Relative: 0 %
Eosinophils Absolute: 318 cells/uL (ref 15–500)
Eosinophils Relative: 6 %
HCT: 42 % (ref 38.5–50.0)
Hemoglobin: 13.8 g/dL (ref 13.2–17.1)
Lymphocytes Relative: 27 %
Lymphs Abs: 1431 cells/uL (ref 850–3900)
MCH: 27 pg (ref 27.0–33.0)
MCHC: 32.9 g/dL (ref 32.0–36.0)
MCV: 82 fL (ref 80.0–100.0)
MPV: 11.4 fL (ref 7.5–12.5)
Monocytes Absolute: 318 cells/uL (ref 200–950)
Monocytes Relative: 6 %
Neutro Abs: 3233 cells/uL (ref 1500–7800)
Neutrophils Relative %: 61 %
Platelets: 180 10*3/uL (ref 140–400)
RBC: 5.12 MIL/uL (ref 4.20–5.80)
RDW: 14.9 % (ref 11.0–15.0)
WBC: 5.3 10*3/uL (ref 3.8–10.8)

## 2017-01-12 LAB — BASIC METABOLIC PANEL
BUN: 18 mg/dL (ref 7–25)
CO2: 26 mmol/L (ref 20–32)
Calcium: 9.3 mg/dL (ref 8.6–10.3)
Chloride: 106 mmol/L (ref 98–110)
Creat: 1.42 mg/dL — ABNORMAL HIGH (ref 0.70–1.33)
Glucose, Bld: 86 mg/dL (ref 65–99)
Potassium: 4.4 mmol/L (ref 3.5–5.3)
Sodium: 140 mmol/L (ref 135–146)

## 2017-01-12 LAB — LIPID PANEL
Cholesterol: 194 mg/dL (ref <200)
HDL: 39 mg/dL — ABNORMAL LOW (ref >40)
LDL Cholesterol: 142 mg/dL — ABNORMAL HIGH (ref <100)
Total CHOL/HDL Ratio: 5 Ratio — ABNORMAL HIGH (ref <5.0)
Triglycerides: 64 mg/dL (ref <150)
VLDL: 13 mg/dL (ref <30)

## 2017-01-12 NOTE — Patient Instructions (Signed)
GO TO THE LAB : Get the blood work     GO TO THE FRONT DESK Schedule your next appointment for a  routine check up in 6 months, no need to be fasting

## 2017-01-12 NOTE — Progress Notes (Signed)
Pre visit review using our clinic review tool, if applicable. No additional management support is needed unless otherwise documented below in the visit note. 

## 2017-01-12 NOTE — Progress Notes (Signed)
Subjective:    Patient ID: David Smith, male    DOB: 02/01/1956, 61 y.o.   MRN: [id number]  DOS:  01/12/2017 Type of visit - description : rov Interval history: No major concerns. Still has occasional back and neck pain along with dizziness. Taking Antivert with good control of his symptoms Was diagnosed with spinal compression (see assessment) , occasionally hand numbness. Has gross hematuria on and off HTN: Good compliance of medication, BP today is great   Review of Systems   Past Medical History:  Diagnosis Date  . Chronic lower back pain 06-11-12   daily a problem Pain level 7, Tylenol helpful(down to level 2)  . H/O cardiovascular stress test    non ischemic EF 55% 05-15-2007  . Heart murmur    dx as a teen, Stress ECHO 2008 (-)   . Hypertension   . Prostate cancer metastatic to intrapelvic lymph node ([facility])    gleason 4+3=7, volume 56.4 cc, PSA 34.25    Past Surgical History:  Procedure Laterality Date  . ACHILLES TENDON REPAIR     26 yrs ago, right foot  . ANAL FISSURECTOMY  2008  . dental procedure  06-11-12   minor -office procedure to correct jaw issue-used anesthesia gas to do  . LYMPHADENECTOMY  06/17/2012   Procedure: LYMPHADENECTOMY;  Surgeon: [name], MD;  Location: [facility];  Service: Urology;  Laterality: Bilateral;  . PROSTATE BIOPSY    . ROBOT ASSISTED LAPAROSCOPIC RADICAL PROSTATECTOMY  06/17/2012   Procedure: ROBOTIC ASSISTED LAPAROSCOPIC RADICAL PROSTATECTOMY LEVEL 2;  Surgeon: [name], MD;  Location: [facility];  Service: Urology;  Laterality: N/A;       Social History   Social History  . Marital status: Married    Spouse name: N/A  . Number of children: 3  . Years of education: N/A   Occupational History  . disability d/t back pain-not working at present    Social History Main Topics  . Smoking status: Never Smoker  . Smokeless tobacco: Never Used  . Alcohol use 0.0 oz/week     Comment: rare   . Drug use: No  . Sexual activity: Yes     Other Topics Concern  . Not on file   Social History Narrative  . No narrative on file      Allergies as of 01/12/2017   No Known Allergies     Medication List       Accurate as of 01/12/17 11:59 PM. Always use your most recent med list.          acetaminophen 325 MG tablet Commonly known as:  TYLENOL Take 650 mg by mouth every 6 (six) hours as needed. Pain   CALCIUM 600 + D PO Take 600 mg by mouth daily.   lisinopril-hydrochlorothiazide 20-12.5 MG tablet Commonly known as:  PRINZIDE,ZESTORETIC Take 2 tablets by mouth daily.   meclizine 25 MG tablet Commonly known as:  ANTIVERT Take 1 tablet (25 mg total) by mouth 2 (two) times daily as needed.   sildenafil 100 MG tablet Commonly known as:  VIAGRA Take 100 mg by mouth daily as needed for erectile dysfunction. Reported on 06/28/2015   VITAMIN B 12 PO Take 1 tablet by mouth daily.          Objective:   Physical Exam BP 124/70 (BP Location: Left Arm, Patient Position: Sitting, Cuff Size: Normal)   Pulse 60   Temp 98.2 F (36.8 C) (Oral)   Resp 14  Ht 6\' 1"  (1.854 m)   Wt 249 lb 6 oz (113.1 kg)   SpO2 96%   BMI 32.90 kg/m  General:   Well developed, well nourished . NAD.  HEENT:  Normocephalic . Face symmetric, atraumatic Lungs:  CTA B Normal respiratory effort, no intercostal retractions, no accessory muscle use. Heart: RRR,  no murmur.  No pretibial edema bilaterally  Skin: Not pale. Not jaundice Neurologic:  alert & oriented X3.  Speech normal, gait appropriate for age and unassisted Psych--  Cognition and judgment appear intact.  Cooperative with normal attention span and concentration.  Behavior appropriate. No anxious or depressed appearing.      Assessment & Plan:   Assessment hyperglycemia: 01-2016 A1C 6.0 HTN Dyslipidemia Insomnia-- no need for med as off 06-2015 Chronic back pain, offered surgery at some point, declined  GU: -Prostate cancer, DX 2013, PSA 34.25, s/p  prostatectomy, lymphadenectomy 2014. S/p XRT; + lymph nods mets , PSA increased, got lupron,  as off 01-2017 lupron d/c  -Hematuria- CT -cysto 2016 per urology-- likely d/t h/o XRT No transfusions, Jehovah witness Mild anemia, normal iron and ferritin 06-2015 H/o heart murmur, stress echo 2008 negative Syncope, dizziness  headaches:  -Saw neurology 4-17,w/u done, CTA head, neck, then neck MRI: dx w/ spinal compression, saw surgery 10-2015: rx non emergent surgery (no surgery as off 01-2017), finding was not causing HAs-dizzines  -Cervical spinal compression per MRI 09-2015 Shingles, R face, 03-2016, saw ophtalmology  PLAN: Hyperglycemia: Diet control, last A1c is stable. HTN: Continue with lisinopril HCT. Checking a BMP and CBC. Dyslipidemia, diet control, check FLP. Prostate cancer: Last PSA was stable according to the patient, off Lupron. Current Rx observation. Gross hematuria: Felt to be due to history of XRT, last episode yesterday. Cervical spinal compression: Occasionally has some numbness, was advise by neurosurgery to call them  if sxs increase or are persistent. Aspirin: Patient takes aspirin, has gross hematuria on-off, rec to stop aspirin noting he did not have any vasculopathy on CTA head and neck last year. RTC 6 months

## 2017-01-13 NOTE — Assessment & Plan Note (Signed)
Hyperglycemia: Diet control, last A1c is stable. HTN: Continue with lisinopril HCT. Checking a BMP and CBC. Dyslipidemia, diet control, check FLP. Prostate cancer: Last PSA was stable according to the patient, off Lupron. Current Rx observation. Gross hematuria: Felt to be due to history of XRT, last episode yesterday. Cervical spinal compression: Occasionally has some numbness, was advise by neurosurgery to call them  if sxs increase or are persistent. Aspirin: Patient takes aspirin, has gross hematuria on-off, rec to stop aspirin noting he did not have any vasculopathy on CTA head and neck last year. RTC 6 months

## 2017-01-30 DIAGNOSIS — C61 Malignant neoplasm of prostate: Secondary | ICD-10-CM | POA: Diagnosis not present

## 2017-02-15 ENCOUNTER — Telehealth: Payer: Self-pay | Admitting: Internal Medicine

## 2017-02-15 DIAGNOSIS — C61 Malignant neoplasm of prostate: Secondary | ICD-10-CM

## 2017-02-15 MED ORDER — LISINOPRIL-HYDROCHLOROTHIAZIDE 20-12.5 MG PO TABS: 2.0000 | ORAL_TABLET | Freq: Every day | ORAL | 2 refills | Status: DC

## 2017-02-15 NOTE — Telephone Encounter (Signed)
Caller name: Marquavion  Relation to pt: self  Call back number: [phone] Pharmacy: [place], [place] - [address]  Reason for call: Pt is requesting refill on lisinopril-hydrochlorothiazide (PRINZIDE,ZESTORETIC) 20-12.5 MG tablet, pt only has 3 pills left. Pt states needs also a referral to his Urologist (Dr. Raynelle Bring tel [phone] and there fax number is [phone]).  Please advise

## 2017-02-15 NOTE — Telephone Encounter (Signed)
Rx sent. Urology referral placed to Dr. Alinda Money.

## 2017-03-21 ENCOUNTER — Telehealth: Payer: Self-pay | Admitting: Internal Medicine

## 2017-03-21 NOTE — Telephone Encounter (Signed)
Copied from [place] [id number]. Topic: Quick Communication - See Telephone Encounter >> Mar 21, 2017 10:17 AM Gerrie Nordmann wrote: CRM for notification. See Telephone encounter for:  03/21/17.  Attempted to call patient to schedule AWV. Patient did not answer. Last AWV on 01/10/2016. Pt can scheduled appt at anytime. SF

## 2017-04-04 DIAGNOSIS — Z23 Encounter for immunization: Secondary | ICD-10-CM | POA: Diagnosis not present

## 2017-05-22 DIAGNOSIS — C61 Malignant neoplasm of prostate: Secondary | ICD-10-CM | POA: Diagnosis not present

## 2017-05-22 LAB — PSA: PSA: 0.29

## 2017-06-06 DIAGNOSIS — C775 Secondary and unspecified malignant neoplasm of intrapelvic lymph nodes: Secondary | ICD-10-CM | POA: Diagnosis not present

## 2017-06-06 DIAGNOSIS — C61 Malignant neoplasm of prostate: Secondary | ICD-10-CM | POA: Diagnosis not present

## 2017-06-14 ENCOUNTER — Encounter: Payer: Self-pay | Admitting: Internal Medicine

## 2017-07-16 ENCOUNTER — Ambulatory Visit: Payer: Medicare Other | Admitting: Internal Medicine

## 2017-07-16 DIAGNOSIS — Z0289 Encounter for other administrative examinations: Secondary | ICD-10-CM

## 2017-07-17 ENCOUNTER — Telehealth: Payer: Self-pay

## 2017-07-17 ENCOUNTER — Encounter: Payer: Self-pay | Admitting: Internal Medicine

## 2017-07-17 NOTE — Telephone Encounter (Signed)
Copied from [place] [id number]. Topic: General - Other >> Jul 17, 2017  1:28 PM Yvette Rack wrote: Reason for CRM: patient calling to let Dr Larose Kells know that he had a flu shot in Oct 2018 and wanted it updated in his chart

## 2017-07-23 ENCOUNTER — Encounter: Payer: Self-pay | Admitting: Internal Medicine

## 2017-07-23 ENCOUNTER — Ambulatory Visit (INDEPENDENT_AMBULATORY_CARE_PROVIDER_SITE_OTHER): Payer: Medicare Other | Admitting: Internal Medicine

## 2017-07-23 VITALS — BP 126/74 | HR 64 | Temp 98.0°F | Resp 14 | Ht 73.0 in | Wt 249.4 lb

## 2017-07-23 DIAGNOSIS — I10 Essential (primary) hypertension: Secondary | ICD-10-CM | POA: Diagnosis not present

## 2017-07-23 DIAGNOSIS — R739 Hyperglycemia, unspecified: Secondary | ICD-10-CM

## 2017-07-23 LAB — COMPREHENSIVE METABOLIC PANEL
ALT: 19 U/L (ref 0–53)
AST: 16 U/L (ref 0–37)
Albumin: 4.1 g/dL (ref 3.5–5.2)
Alkaline Phosphatase: 69 U/L (ref 39–117)
BUN: 18 mg/dL (ref 6–23)
CO2: 29 mEq/L (ref 19–32)
Calcium: 8.9 mg/dL (ref 8.4–10.5)
Chloride: 105 mEq/L (ref 96–112)
Creatinine, Ser: 1.25 mg/dL (ref 0.40–1.50)
GFR: 75.89 mL/min (ref >60.00)
Glucose, Bld: 92 mg/dL (ref 70–99)
Potassium: 4 mEq/L (ref 3.5–5.1)
Sodium: 141 mEq/L (ref 135–145)
Total Bilirubin: 0.4 mg/dL (ref 0.2–1.2)
Total Protein: 7.2 g/dL (ref 6.0–8.3)

## 2017-07-23 LAB — HEMOGLOBIN A1C: Hgb A1c MFr Bld: 5.9 % (ref 4.6–6.5)

## 2017-07-23 NOTE — Patient Instructions (Signed)
GO TO THE LAB : Get the blood work     GO TO THE FRONT DESK Schedule your next appointment for a routine follow-up in 6 months

## 2017-07-23 NOTE — Progress Notes (Signed)
Subjective:    Patient ID: David Smith, male    DOB: 1955-08-14, 62 y.o.   MRN: [id number]  DOS:  07/23/2017 Type of visit - description : ROV Interval history: Prostate cancer: Note from urology reviewed HTN: Good compliance with medication, no apparent side effects, no recent ambulatory BPs  Review of Systems Still has occasional gross hematuria Diet is usually okay, has not been able to exercise much lately. Anxiety depression Had a stomach virus now back to normal.  Past Medical History:  Diagnosis Date  . Chronic lower back pain 06-11-12   daily a problem Pain level 7, Tylenol helpful(down to level 2)  . H/O cardiovascular stress test    non ischemic EF 55% 05-15-2007  . Heart murmur    dx as a teen, Stress ECHO 2008 (-)   . Hypertension   . Prostate cancer metastatic to intrapelvic lymph node (HCC)    gleason 4+3=7, volume 56.4 cc, PSA 34.25    Past Surgical History:  Procedure Laterality Date  . ACHILLES TENDON REPAIR     26 yrs ago, right foot  . ANAL FISSURECTOMY  2008  . dental procedure  06-11-12   minor -office procedure to correct jaw issue-used anesthesia gas to do  . LYMPHADENECTOMY  06/17/2012   Procedure: LYMPHADENECTOMY;  Surgeon: [name], MD;  Location: [facility];  Service: Urology;  Laterality: Bilateral;  . PROSTATE BIOPSY    . ROBOT ASSISTED LAPAROSCOPIC RADICAL PROSTATECTOMY  06/17/2012   Procedure: ROBOTIC ASSISTED LAPAROSCOPIC RADICAL PROSTATECTOMY LEVEL 2;  Surgeon: [name], MD;  Location: [facility];  Service: Urology;  Laterality: N/A;       Social History   Socioeconomic History  . Marital status: Married    Spouse name: Not on file  . Number of children: 3  . Years of education: Not on file  . Highest education level: Not on file  Social Needs  . Financial resource strain: Not on file  . Food insecurity - worry: Not on file  . Food insecurity - inability: Not on file  . Transportation needs - medical: Not on file  . Transportation  needs - non-medical: Not on file  Occupational History  . Occupation: disability d/t back pain-not working at present  Tobacco Use  . Smoking status: Never Smoker  . Smokeless tobacco: Never Used  Substance and Sexual Activity  . Alcohol use: Yes    Alcohol/week: 0.0 oz    Comment: rare   . Drug use: No  . Sexual activity: Yes  Other Topics Concern  . Not on file  Social History Narrative  . Not on file      Allergies as of 07/23/2017   No Known Allergies     Medication List        Accurate as of 07/23/17  9:49 PM. Always use your most recent med list.          acetaminophen 325 MG tablet Commonly known as:  TYLENOL Take 650 mg by mouth every 6 (six) hours as needed. Pain   CALCIUM 600 + D PO Take 600 mg by mouth daily.   lisinopril-hydrochlorothiazide 20-12.5 MG tablet Commonly known as:  PRINZIDE,ZESTORETIC Take 2 tablets by mouth daily.   meclizine 25 MG tablet Commonly known as:  ANTIVERT Take 1 tablet (25 mg total) by mouth 2 (two) times daily as needed.   sildenafil 100 MG tablet Commonly known as:  VIAGRA Take 100 mg by mouth daily as needed for erectile dysfunction.  Reported on 06/28/2015   VITAMIN B 12 PO Take 1 tablet by mouth daily.          Objective:   Physical Exam BP 126/74 (BP Location: Left Arm, Patient Position: Sitting, Cuff Size: Normal)   Pulse 64   Temp 98 F (36.7 C) (Oral)   Resp 14   Ht 6\' 1"  (1.854 m)   Wt 249 lb 6 oz (113.1 kg)   SpO2 97%   BMI 32.90 kg/m  General:   Well developed, well nourished . NAD.  HEENT:  Normocephalic . Face symmetric, atraumatic Lungs:  CTA B Normal respiratory effort, no intercostal retractions, no accessory muscle use. Heart: RRR,  no murmur.  No pretibial edema bilaterally  Skin: Not pale. Not jaundice Neurologic:  alert & oriented X3.  Speech normal, gait appropriate for age and unassisted Psych--  Cognition and judgment appear intact.  Cooperative with normal attention span and  concentration.  Behavior appropriate. No anxious or depressed appearing.      Assessment & Plan:     Assessment hyperglycemia: 01-2016 A1C 6.0 HTN Dyslipidemia Insomnia-- no need for med as off 07/2017 Chronic back pain, offered surgery at some point, declined  GU: ----Prostate cancer, Hormone sensitive.DX 2013, PSA 34.25, s/p prostatectomy, lymphadenectomy 2014. S/p XRT; + lymph nods mets , PSA increased, s/p lupron   ----Hematuria- CT -cysto 2016 per urology-- likely d/t h/o XRT No transfusions, Jehovah witness Mild anemia, normal iron and ferritin 06-2015 H/o heart murmur, stress echo 2008 negative Syncope, dizziness  headaches:  -Saw neurology 4-17,w/u done, CTA head, neck, then neck MRI: dx w/ spinal compression, saw surgery 10-2015: rx non emergent surgery (no surgery as off 01-2017), finding was not causing HAs-dizzines  -Cervical spinal compression per MRI 09-2015 Shingles, R face, 03-2016, saw ophtalmology  PLAN: Hyperglycemia: Diet controlled, lifestyle discussed with the patient, check a A1c HTN: On lisinopril HCTZ, check a CMP. Prostate cancer, metastatic, hormone sensitive: Urology note 06-2017 reviewed, slight increase in PSA, the decision was made to observe for now, recheck in few months.  Patient is doing emotionally okay.  He still has occasional hematuria. H/o back pain, neck pain: Sporadic, not a major issue at this time. RTC 6 months

## 2017-07-23 NOTE — Progress Notes (Signed)
Pre visit review using our clinic review tool, if applicable. No additional management support is needed unless otherwise documented below in the visit note. 

## 2017-07-23 NOTE — Assessment & Plan Note (Signed)
Hyperglycemia: Diet controlled, lifestyle discussed with the patient, check a A1c HTN: On lisinopril HCTZ, check a CMP. Prostate cancer, metastatic, hormone sensitive: Urology note 06-2017 reviewed, slight increase in PSA, the decision was made to observe for now, recheck in few months.  Patient is doing emotionally okay.  He still has occasional hematuria. H/o back pain, neck pain: Sporadic, not a major issue at this time. RTC 6 months

## 2017-09-18 DIAGNOSIS — C61 Malignant neoplasm of prostate: Secondary | ICD-10-CM | POA: Diagnosis not present

## 2018-01-02 DIAGNOSIS — C61 Malignant neoplasm of prostate: Secondary | ICD-10-CM | POA: Diagnosis not present

## 2018-01-02 LAB — PSA: PSA: 1.02

## 2018-01-09 DIAGNOSIS — C775 Secondary and unspecified malignant neoplasm of intrapelvic lymph nodes: Secondary | ICD-10-CM | POA: Diagnosis not present

## 2018-01-09 DIAGNOSIS — C61 Malignant neoplasm of prostate: Secondary | ICD-10-CM | POA: Diagnosis not present

## 2018-01-09 DIAGNOSIS — R3121 Asymptomatic microscopic hematuria: Secondary | ICD-10-CM | POA: Diagnosis not present

## 2018-01-15 ENCOUNTER — Encounter: Payer: Self-pay | Admitting: Internal Medicine

## 2018-01-21 ENCOUNTER — Ambulatory Visit: Payer: Medicare Other | Admitting: Internal Medicine

## 2018-01-21 ENCOUNTER — Telehealth: Payer: Self-pay

## 2018-01-21 NOTE — Telephone Encounter (Signed)
Author left another VM today informing pt. that Dr. Larose Kells is not in the office, and gave pt. call back number to reschedule.

## 2018-01-31 ENCOUNTER — Encounter: Payer: Self-pay | Admitting: Internal Medicine

## 2018-01-31 ENCOUNTER — Ambulatory Visit (INDEPENDENT_AMBULATORY_CARE_PROVIDER_SITE_OTHER): Payer: Medicare Other | Admitting: Internal Medicine

## 2018-01-31 VITALS — BP 128/89 | HR 58 | Temp 99.1°F | Resp 16 | Ht 73.0 in | Wt 247.6 lb

## 2018-01-31 DIAGNOSIS — R040 Epistaxis: Secondary | ICD-10-CM

## 2018-01-31 DIAGNOSIS — I10 Essential (primary) hypertension: Secondary | ICD-10-CM

## 2018-01-31 NOTE — Progress Notes (Signed)
Subjective:    Patient ID: David Smith, male    DOB: 12/10/1955, 62 y.o.   MRN: [id number]  DOS:  01/31/2018 Type of visit - description : f/u Interval history: Prostate cancer, sees urology regularly, issue was discussed today HTN: Good compliance with medication, no ambulatory BPs. Nosebleeds: On and off for a while, always left-sided, triggered by sneezing.  He sees long clots from the left nose when that happens. Denies blood in the stools or easy bleeding from the skin or gum.  He does have hematuria.   Review of Systems On-off dizziness, on Antivert. Occasionally,  dizziness is associated with nausea. No vomiting or diarrhea No depression.  Past Medical History:  Diagnosis Date  . Chronic lower back pain 06-11-12   daily a problem Pain level 7, Tylenol helpful(down to level 2)  . H/O cardiovascular stress test    non ischemic EF 55% 05-15-2007  . Heart murmur    dx as a teen, Stress ECHO 2008 (-)   . Hypertension   . Prostate cancer metastatic to intrapelvic lymph node (HCC)    gleason 4+3=7, volume 56.4 cc, PSA 34.25    Past Surgical History:  Procedure Laterality Date  . ACHILLES TENDON REPAIR     26 yrs ago, right foot  . ANAL FISSURECTOMY  2008  . dental procedure  06-11-12   minor -office procedure to correct jaw issue-used anesthesia gas to do  . LYMPHADENECTOMY  06/17/2012   Procedure: LYMPHADENECTOMY;  Surgeon: [name], MD;  Location: [facility];  Service: Urology;  Laterality: Bilateral;  . PROSTATE BIOPSY    . ROBOT ASSISTED LAPAROSCOPIC RADICAL PROSTATECTOMY  06/17/2012   Procedure: ROBOTIC ASSISTED LAPAROSCOPIC RADICAL PROSTATECTOMY LEVEL 2;  Surgeon: [name], MD;  Location: [facility];  Service: Urology;  Laterality: N/A;       Social History   Socioeconomic History  . Marital status: Married    Spouse name: Not on file  . Number of children: 3  . Years of education: Not on file  . Highest education level: Not on file  Occupational History  .  Occupation: disability d/t back pain-not working at present  Social Needs  . Financial resource strain: Not on file  . Food insecurity:    Worry: Not on file    Inability: Not on file  . Transportation needs:    Medical: Not on file    Non-medical: Not on file  Tobacco Use  . Smoking status: Never Smoker  . Smokeless tobacco: Never Used  Substance and Sexual Activity  . Alcohol use: Yes    Alcohol/week: 0.0 standard drinks    Comment: rare   . Drug use: No  . Sexual activity: Yes  Lifestyle  . Physical activity:    Days per week: Not on file    Minutes per session: Not on file  . Stress: Not on file  Relationships  . Social connections:    Talks on phone: Not on file    Gets together: Not on file    Attends religious service: Not on file    Active member of club or organization: Not on file    Attends meetings of clubs or organizations: Not on file    Relationship status: Not on file  . Intimate partner violence:    Fear of current or ex partner: Not on file    Emotionally abused: Not on file    Physically abused: Not on file    Forced sexual activity: Not  on file  Other Topics Concern  . Not on file  Social History Narrative   Household: pt, wife 28 y/o child       Allergies as of 01/31/2018   No Known Allergies     Medication List        Accurate as of 01/31/18  5:42 PM. Always use your most recent med list.          acetaminophen 325 MG tablet Commonly known as:  TYLENOL Take 650 mg by mouth every 6 (six) hours as needed. Pain   CALCIUM 600 + D PO Take 600 mg by mouth daily.   leuprolide 30 MG injection Commonly known as:  LUPRON Inject 30 mg into the muscle every 4 (four) months.   lisinopril-hydrochlorothiazide 20-12.5 MG tablet Commonly known as:  PRINZIDE,ZESTORETIC Take 2 tablets by mouth daily.   meclizine 25 MG tablet Commonly known as:  ANTIVERT Take 1 tablet (25 mg total) by mouth 2 (two) times daily as needed.   sildenafil 100 MG  tablet Commonly known as:  VIAGRA Take 100 mg by mouth daily as needed for erectile dysfunction. Reported on 06/28/2015   VITAMIN B 12 PO Take 1 tablet by mouth daily.          Objective:   Physical Exam BP 128/89 (BP Location: Left Arm, Patient Position: Sitting, Cuff Size: Large)   Pulse (!) 58   Temp 99.1 F (37.3 C) (Oral)   Resp 16   Ht 6\' 1"  (1.854 m)   Wt 247 lb 9.6 oz (112.3 kg)   SpO2 100%   BMI 32.67 kg/m  General:   Well developed, NAD, see BMI.  HEENT:  Normocephalic . Face symmetric, atraumatic. TMs normal.   Nose: Right nostril normal, left nostril with no active bleeding, turbinates seems enlarged.  No obvious lesions on limited inspection. Lungs:  CTA B Normal respiratory effort, no intercostal retractions, no accessory muscle use. Heart: RRR,  no murmur.  No pretibial edema bilaterally  Skin: Not pale. Not jaundice Neurologic:  alert & oriented X3.  Speech normal, gait appropriate for age and unassisted Psych--  Cognition and judgment appear intact.  Cooperative with normal attention span and concentration.  Behavior appropriate. No anxious or depressed appearing.      Assessment & Plan:   Assessment hyperglycemia: 01-2016 A1C 6.0 HTN Dyslipidemia Insomnia-- no need for med as off 07/2017 Chronic back pain, offered surgery at some point, declined  GU: --Prostate cancer, Hormone sensitive.DX 2013, PSA 34.25, s/p prostatectomy, lymphadenectomy 2014. S/p XRT; + lymph nods mets , PSA increased, s/p lupron   --Hematuria- CT -cysto 2016 per urology-- likely d/t h/o XRT No transfusions, Jehovah witness Mild anemia, normal iron and ferritin 06-2015 H/o heart murmur, stress echo 2008 negative Syncope, dizziness  headaches:  -Saw neurology 4-17,w/u done, CTA head, neck, then neck MRI: dx w/ spinal compression, saw surgery 10-2015: rx non emergent surgery (no surgery as off 01-2017), finding was not causing HAs-dizzines  -Cervical spinal compression per  MRI 09-2015 Shingles, R face, 03-2016, saw ophtalmology  PLAN: HTN: Continue Zestoretic, last BMP satisfactory, monitor BPs Prostate cancer: He reports hematuria and recent increase in PSA, closely f/u by urology, they recently did a cystoscopy, some spots were found, no biopsy done.  They are planning to do a bone scan soon.  They are considering Lupron in the next few months.  Emotional support provided, he is doing well, he is concerned about Lupron triggering depression as it did in the  past.  Knows to call if he needs help such as SSRI. Nosebleeds: Left-sided,  will refer to ENT. RTC 6 to 8 months, physical exam.

## 2018-01-31 NOTE — Assessment & Plan Note (Signed)
HTN: Continue Zestoretic, last BMP satisfactory, monitor BPs Prostate cancer: He reports hematuria and recent increase in PSA, closely f/u by urology, they recently did a cystoscopy, some spots were found, no biopsy done.  They are planning to do a bone scan soon.  They are considering Lupron in the next few months.  Emotional support provided, he is doing well, he is concerned about Lupron triggering depression as it did in the past.  Knows to call if he needs help such as SSRI. Nosebleeds: Left-sided,  will refer to ENT. RTC 6 to 8 months, physical exam.

## 2018-01-31 NOTE — Patient Instructions (Signed)
  GO TO THE FRONT DESK Schedule your next appointment for a  Physical exam, fasting in 6-8 months   Check the  blood pressure 2 or 3 times a month  Be sure your blood pressure is between 110/65 and  135/85. If it is consistently higher or lower, let me know

## 2018-04-05 DIAGNOSIS — Z23 Encounter for immunization: Secondary | ICD-10-CM | POA: Diagnosis not present

## 2018-04-16 DIAGNOSIS — C61 Malignant neoplasm of prostate: Secondary | ICD-10-CM | POA: Diagnosis not present

## 2018-04-18 ENCOUNTER — Other Ambulatory Visit: Payer: Self-pay | Admitting: Internal Medicine

## 2018-06-06 ENCOUNTER — Other Ambulatory Visit (HOSPITAL_COMMUNITY): Payer: Self-pay | Admitting: Urology

## 2018-06-06 ENCOUNTER — Other Ambulatory Visit: Payer: Self-pay | Admitting: Urology

## 2018-06-06 DIAGNOSIS — C61 Malignant neoplasm of prostate: Secondary | ICD-10-CM

## 2018-07-03 DIAGNOSIS — C61 Malignant neoplasm of prostate: Secondary | ICD-10-CM | POA: Diagnosis not present

## 2018-07-03 LAB — PSA: PSA: 2.49

## 2018-07-09 ENCOUNTER — Encounter (HOSPITAL_COMMUNITY)
Admission: RE | Admit: 2018-07-09 | Discharge: 2018-07-09 | Disposition: A | Discharge status: Home / Self Care | Payer: Medicare Other | Source: Ambulatory Visit | Attending: Urology | Admitting: Urology

## 2018-07-09 DIAGNOSIS — Z8546 Personal history of malignant neoplasm of prostate: Secondary | ICD-10-CM | POA: Diagnosis not present

## 2018-07-09 DIAGNOSIS — C61 Malignant neoplasm of prostate: Secondary | ICD-10-CM | POA: Insufficient documentation

## 2018-07-09 MED ORDER — TECHNETIUM TC 99M MEDRONATE IV KIT
21.2000 | PACK | Freq: Once | INTRAVENOUS | Status: AC | PRN
  Administered 2018-07-09: 21.2 via INTRAVENOUS

## 2018-07-17 DIAGNOSIS — R3121 Asymptomatic microscopic hematuria: Secondary | ICD-10-CM | POA: Diagnosis not present

## 2018-07-17 DIAGNOSIS — C61 Malignant neoplasm of prostate: Secondary | ICD-10-CM | POA: Diagnosis not present

## 2018-07-25 ENCOUNTER — Encounter: Payer: Self-pay | Admitting: Internal Medicine

## 2018-10-14 DIAGNOSIS — C61 Malignant neoplasm of prostate: Secondary | ICD-10-CM | POA: Diagnosis not present

## 2018-11-27 DIAGNOSIS — H40033 Anatomical narrow angle, bilateral: Secondary | ICD-10-CM | POA: Diagnosis not present

## 2018-11-27 DIAGNOSIS — H2513 Age-related nuclear cataract, bilateral: Secondary | ICD-10-CM | POA: Diagnosis not present

## 2019-01-03 ENCOUNTER — Encounter: Payer: Self-pay | Admitting: Internal Medicine

## 2019-02-12 DIAGNOSIS — C61 Malignant neoplasm of prostate: Secondary | ICD-10-CM | POA: Diagnosis not present

## 2019-02-19 DIAGNOSIS — C61 Malignant neoplasm of prostate: Secondary | ICD-10-CM | POA: Diagnosis not present

## 2019-02-19 DIAGNOSIS — C775 Secondary and unspecified malignant neoplasm of intrapelvic lymph nodes: Secondary | ICD-10-CM | POA: Diagnosis not present

## 2019-02-27 ENCOUNTER — Other Ambulatory Visit: Payer: Self-pay

## 2019-02-28 ENCOUNTER — Ambulatory Visit (INDEPENDENT_AMBULATORY_CARE_PROVIDER_SITE_OTHER): Payer: Medicare Other | Admitting: Internal Medicine

## 2019-02-28 ENCOUNTER — Other Ambulatory Visit: Payer: Self-pay

## 2019-02-28 DIAGNOSIS — R509 Fever, unspecified: Secondary | ICD-10-CM | POA: Diagnosis not present

## 2019-02-28 DIAGNOSIS — Z20828 Contact with and (suspected) exposure to other viral communicable diseases: Secondary | ICD-10-CM | POA: Diagnosis not present

## 2019-02-28 DIAGNOSIS — Z20822 Contact with and (suspected) exposure to covid-19: Secondary | ICD-10-CM

## 2019-02-28 DIAGNOSIS — R05 Cough: Secondary | ICD-10-CM | POA: Diagnosis not present

## 2019-02-28 DIAGNOSIS — R6889 Other general symptoms and signs: Secondary | ICD-10-CM | POA: Diagnosis not present

## 2019-02-28 DIAGNOSIS — I10 Essential (primary) hypertension: Secondary | ICD-10-CM | POA: Diagnosis not present

## 2019-02-28 DIAGNOSIS — Z23 Encounter for immunization: Secondary | ICD-10-CM | POA: Diagnosis not present

## 2019-02-28 NOTE — Progress Notes (Signed)
Subjective:    Patient ID: ZAM ENT, male    DOB: [birth year], 63 y.o.   MRN: [id number]  DOS:  02/28/2019 Type of visit - description: Virtual Visit via Video Note  I connected with@   by a video enabled telemedicine application and verified that I am speaking with the correct person using two identifiers.   THIS ENCOUNTER IS A VIRTUAL VISIT DUE TO COVID-19 - PATIENT WAS NOT SEEN IN THE OFFICE. PATIENT HAS CONSENTED TO VIRTUAL VISIT / TELEMEDICINE VISIT   Location of patient: home  Location of provider: office  I discussed the limitations of evaluation and management by telemedicine and the availability of in person appointments. The patient expressed understanding and agreed to proceed.  History of Present Illness: Acute Symptoms of started 3 days ago with mild cough, he felt due to seasonal allergies however yesterday he had subjective fever. He is following good coronavirus precautions. I have not seen him in a while, he sees urology regularly. BPs when checked at the urology office are typically okay.   Review of Systems Denies nausea, vomiting, diarrhea. No chest pain no difficulty breathing No sinus congestion but he reports some sneezing. No itchy eyes or nose.   Past Medical History:  Diagnosis Date  . Chronic lower back pain 06-11-12   daily a problem Pain level 7, Tylenol helpful(down to level 2)  . H/O cardiovascular stress test    non ischemic EF 55% 05-15-2007  . Heart murmur    dx as a teen, Stress ECHO 2008 (-)   . Hypertension   . Prostate cancer metastatic to intrapelvic lymph node (HCC)    gleason 4+3=7, volume 56.4 cc, PSA 34.25    Past Surgical History:  Procedure Laterality Date  . ACHILLES TENDON REPAIR     26 yrs ago, right foot  . ANAL FISSURECTOMY  2008  . dental procedure  06-11-12   minor -office procedure to correct jaw issue-used anesthesia gas to do  . LYMPHADENECTOMY  06/17/2012   Procedure: LYMPHADENECTOMY;  Surgeon: [name],  MD;  Location: [facility];  Service: Urology;  Laterality: Bilateral;  . PROSTATE BIOPSY    . ROBOT ASSISTED LAPAROSCOPIC RADICAL PROSTATECTOMY  06/17/2012   Procedure: ROBOTIC ASSISTED LAPAROSCOPIC RADICAL PROSTATECTOMY LEVEL 2;  Surgeon: [name], MD;  Location: [facility];  Service: Urology;  Laterality: N/A;       Social History   Socioeconomic History  . Marital status: Married    Spouse name: Not on file  . Number of children: 3  . Years of education: Not on file  . Highest education level: Not on file  Occupational History  . Occupation: disability d/t back pain-not working at present  Social Needs  . Financial resource strain: Not on file  . Food insecurity    Worry: Not on file    Inability: Not on file  . Transportation needs    Medical: Not on file    Non-medical: Not on file  Tobacco Use  . Smoking status: Never Smoker  . Smokeless tobacco: Never Used  Substance and Sexual Activity  . Alcohol use: Yes    Alcohol/week: 0.0 standard drinks    Comment: rare   . Drug use: No  . Sexual activity: Yes  Lifestyle  . Physical activity    Days per week: Not on file    Minutes per session: Not on file  . Stress: Not on file  Relationships  . Social connections    Talks  on phone: Not on file    Gets together: Not on file    Attends religious service: Not on file    Active member of club or organization: Not on file    Attends meetings of clubs or organizations: Not on file    Relationship status: Not on file  . Intimate partner violence    Fear of current or ex partner: Not on file    Emotionally abused: Not on file    Physically abused: Not on file    Forced sexual activity: Not on file  Other Topics Concern  . Not on file  Social History Narrative   Household: pt, wife 70 y/o child       Allergies as of 02/28/2019   No Known Allergies     Medication List       Accurate as of February 28, 2019 10:09 AM. If you have any questions, ask your nurse or doctor.         acetaminophen 325 MG tablet Commonly known as: TYLENOL Take 650 mg by mouth every 6 (six) hours as needed. Pain   CALCIUM 600 + D PO Take 600 mg by mouth daily.   leuprolide 30 MG injection Commonly known as: LUPRON Inject 30 mg into the muscle every 4 (four) months.   lisinopril-hydrochlorothiazide 20-12.5 MG tablet Commonly known as: ZESTORETIC Take 2 tablets by mouth daily.   meclizine 25 MG tablet Commonly known as: ANTIVERT Take 1 tablet (25 mg total) by mouth 2 (two) times daily as needed.   sildenafil 100 MG tablet Commonly known as: VIAGRA Take 100 mg by mouth daily as needed for erectile dysfunction. Reported on 06/28/2015   VITAMIN B 12 PO Take 1 tablet by mouth daily.           Objective:   Physical Exam There were no vitals taken for this visit. virtual video visit. He is alert oriented x3, no apparent distress.    Assessment     Assessment hyperglycemia: 01-2016 A1C 6.0 HTN Dyslipidemia Insomnia-- no need for med as off 07/2017 Chronic back pain, offered surgery at some point, declined  GU: --Prostate cancer, Hormone sensitive.DX 2013, PSA 34.25, s/p prostatectomy, lymphadenectomy 2014. S/p XRT; + lymph nods mets , PSA increased, s/p lupron   --Hematuria- CT -cysto 2016 per urology-- likely d/t h/o XRT No transfusions, Jehovah witness Mild anemia, normal iron and ferritin 06-2015 H/o heart murmur, stress echo 2008 negative Syncope, dizziness  headaches:  -Saw neurology 4-17,w/u done, CTA head, neck, then neck MRI: dx w/ spinal compression, saw surgery 10-2015: rx non emergent surgery (no surgery as off 01-2017), finding was not causing HAs-dizzines  -Cervical spinal compression per MRI 09-2015 Shingles, R face, 03-2016, saw ophtalmology  PLAN: Cough, fever: Symptoms of started 3 days ago, he is nontoxic-appearing. We will check for COVID-19, otherwise treat supportively with Claritin, Robitussin, fluids. Most important, he would let me know  if he is not getting better or if he has severe symptoms such as high fever, chest pain, severe cough, difficulty breathing. HTN: On lisinopril HCT, reports good compliance and normal ambulatory BPs when he sees urology Prostate cancer: Reports that his PSA continue increasing, next visit.  Urology 3 weeks Follow-up: RTC 1 month.  Will call and schedule   I discussed the assessment and treatment plan with the patient. The patient was provided an opportunity to ask questions and all were answered. The patient agreed with the plan and demonstrated an understanding of the instructions.  The patient was advised to call back or seek an in-person evaluation if the symptoms worsen or if the condition fails to improve as anticipated.

## 2019-03-01 ENCOUNTER — Encounter: Payer: Self-pay | Admitting: Internal Medicine

## 2019-03-01 LAB — NOVEL CORONAVIRUS, NAA: SARS-CoV-2, NAA: NOT DETECTED

## 2019-03-01 NOTE — Assessment & Plan Note (Signed)
Cough, fever: Symptoms of started 3 days ago, he is nontoxic-appearing. We will check for COVID-19, otherwise treat supportively with Claritin, Robitussin, fluids. Most important, he would let me know if he is not getting better or if he has severe symptoms such as high fever, chest pain, severe cough, difficulty breathing. HTN: On lisinopril HCT, reports good compliance and normal ambulatory BPs when he sees urology Prostate cancer: Reports that his PSA continue increasing, next visit.  Urology 3 weeks Follow-up: RTC 1 month.  Will call and schedule

## 2019-04-16 ENCOUNTER — Other Ambulatory Visit: Payer: Self-pay | Admitting: Internal Medicine

## 2019-04-19 ENCOUNTER — Other Ambulatory Visit: Payer: Self-pay | Admitting: Internal Medicine

## 2019-05-06 ENCOUNTER — Other Ambulatory Visit: Payer: Self-pay

## 2019-05-07 ENCOUNTER — Ambulatory Visit (INDEPENDENT_AMBULATORY_CARE_PROVIDER_SITE_OTHER): Payer: Medicare Other | Admitting: Internal Medicine

## 2019-05-07 ENCOUNTER — Encounter: Payer: Self-pay | Admitting: Internal Medicine

## 2019-05-07 VITALS — BP 138/75 | HR 59 | Temp 95.6°F | Resp 16 | Ht 73.0 in | Wt 244.0 lb

## 2019-05-07 DIAGNOSIS — R42 Dizziness and giddiness: Secondary | ICD-10-CM

## 2019-05-07 DIAGNOSIS — Z23 Encounter for immunization: Secondary | ICD-10-CM

## 2019-05-07 DIAGNOSIS — E785 Hyperlipidemia, unspecified: Secondary | ICD-10-CM

## 2019-05-07 DIAGNOSIS — C61 Malignant neoplasm of prostate: Secondary | ICD-10-CM | POA: Diagnosis not present

## 2019-05-07 DIAGNOSIS — I10 Essential (primary) hypertension: Secondary | ICD-10-CM | POA: Diagnosis not present

## 2019-05-07 DIAGNOSIS — R739 Hyperglycemia, unspecified: Secondary | ICD-10-CM | POA: Diagnosis not present

## 2019-05-07 LAB — COMPREHENSIVE METABOLIC PANEL
ALT: 17 U/L (ref 0–53)
AST: 15 U/L (ref 0–37)
Albumin: 4.2 g/dL (ref 3.5–5.2)
Alkaline Phosphatase: 75 U/L (ref 39–117)
BUN: 19 mg/dL (ref 6–23)
CO2: 28 mEq/L (ref 19–32)
Calcium: 9 mg/dL (ref 8.4–10.5)
Chloride: 107 mEq/L (ref 96–112)
Creatinine, Ser: 1.32 mg/dL (ref 0.40–1.50)
GFR: 66.65 mL/min (ref >60.00)
Glucose, Bld: 80 mg/dL (ref 70–99)
Potassium: 4.3 mEq/L (ref 3.5–5.1)
Sodium: 140 mEq/L (ref 135–145)
Total Bilirubin: 0.5 mg/dL (ref 0.2–1.2)
Total Protein: 6.6 g/dL (ref 6.0–8.3)

## 2019-05-07 LAB — LIPID PANEL
Cholesterol: 203 mg/dL — ABNORMAL HIGH (ref 0–200)
HDL: 42.1 mg/dL
LDL Cholesterol: 148 mg/dL — ABNORMAL HIGH (ref 0–99)
NonHDL: 161.23
Total CHOL/HDL Ratio: 5
Triglycerides: 66 mg/dL (ref 0.0–149.0)
VLDL: 13.2 mg/dL (ref 0.0–40.0)

## 2019-05-07 LAB — CBC WITH DIFFERENTIAL/PLATELET
Basophils Absolute: 0 10*3/uL (ref 0.0–0.1)
Basophils Relative: 0.8 % (ref 0.0–3.0)
Eosinophils Absolute: 0.3 10*3/uL (ref 0.0–0.7)
Eosinophils Relative: 6 % — ABNORMAL HIGH (ref 0.0–5.0)
HCT: 40.5 % (ref 39.0–52.0)
Hemoglobin: 13.2 g/dL (ref 13.0–17.0)
Lymphocytes Relative: 22.8 % (ref 12.0–46.0)
Lymphs Abs: 1.2 10*3/uL (ref 0.7–4.0)
MCHC: 32.7 g/dL (ref 30.0–36.0)
MCV: 82.3 fl (ref 78.0–100.0)
Monocytes Absolute: 0.3 10*3/uL (ref 0.1–1.0)
Monocytes Relative: 5.8 % (ref 3.0–12.0)
Neutro Abs: 3.4 10*3/uL (ref 1.4–7.7)
Neutrophils Relative %: 64.6 % (ref 43.0–77.0)
Platelets: 158 10*3/uL (ref 150.0–400.0)
RBC: 4.93 Mil/uL (ref 4.22–5.81)
RDW: 14.5 % (ref 11.5–15.5)
WBC: 5.3 10*3/uL (ref 4.0–10.5)

## 2019-05-07 LAB — HEMOGLOBIN A1C: Hgb A1c MFr Bld: 5.8 % (ref 4.6–6.5)

## 2019-05-07 MED ORDER — SHINGRIX 50 MCG/0.5ML IM SUSR: 0.5000 mL | Freq: Once | INTRAMUSCULAR | 1 refills | Status: AC

## 2019-05-07 NOTE — Progress Notes (Signed)
Pre visit review using our clinic review tool, if applicable. No additional management support is needed unless otherwise documented below in the visit note. 

## 2019-05-07 NOTE — Patient Instructions (Addendum)
Please schedule Medicare Wellness with Glenard Haring.   GO TO THE LAB : Get the blood work     GO TO THE FRONT DESK Schedule your next appointment for a checkup in 6 to 8 months    Check the  blood pressure every month BP GOAL is between 110/65 and  135/85. If it is consistently higher or lower, let me know

## 2019-05-07 NOTE — Progress Notes (Signed)
Subjective:    Patient ID: David Smith, male    DOB: 12/03/1955, 63 y.o.   MRN: [id number]  DOS:  05/07/2019 Type of visit - description: Routine checkup Prostate cancer: Unfortunately, PSA continue rising, patient is considering going back on Lupron soon. HTN: Good med compliance, ambulatory BPs when checked are normal High cholesterol: Doing well with diet, trying to stay active.  Due for labs Dizziness: On and off symptoms, on meclizine. Concerned about tick bites.  Denies fever, chills or rash    Review of Systems  Denies chest pain or difficulty breathing No nausea, vomiting, diarrhea No headaches Past Medical History:  Diagnosis Date  . Chronic lower back pain 06-11-12   daily a problem Pain level 7, Tylenol helpful(down to level 2)  . H/O cardiovascular stress test    non ischemic EF 55% 05-15-2007  . Heart murmur    dx as a teen, Stress ECHO 2008 (-)   . Hypertension   . Prostate cancer metastatic to intrapelvic lymph node (HCC)    gleason 4+3=7, volume 56.4 cc, PSA 34.25    Past Surgical History:  Procedure Laterality Date  . ACHILLES TENDON REPAIR     26 yrs ago, right foot  . ANAL FISSURECTOMY  2008  . dental procedure  06-11-12   minor -office procedure to correct jaw issue-used anesthesia gas to do  . LYMPHADENECTOMY  06/17/2012   Procedure: LYMPHADENECTOMY;  Surgeon: [name], MD;  Location: [facility];  Service: Urology;  Laterality: Bilateral;  . PROSTATE BIOPSY    . ROBOT ASSISTED LAPAROSCOPIC RADICAL PROSTATECTOMY  06/17/2012   Procedure: ROBOTIC ASSISTED LAPAROSCOPIC RADICAL PROSTATECTOMY LEVEL 2;  Surgeon: [name], MD;  Location: [facility];  Service: Urology;  Laterality: N/A;       Social History   Socioeconomic History  . Marital status: Married    Spouse name: Not on file  . Number of children: 3  . Years of education: Not on file  . Highest education level: Not on file  Occupational History  . Occupation: disability d/t back pain-not working  at present  Social Needs  . Financial resource strain: Not on file  . Food insecurity    Worry: Not on file    Inability: Not on file  . Transportation needs    Medical: Not on file    Non-medical: Not on file  Tobacco Use  . Smoking status: Never Smoker  . Smokeless tobacco: Never Used  Substance and Sexual Activity  . Alcohol use: Yes    Alcohol/week: 0.0 standard drinks    Comment: rare   . Drug use: No  . Sexual activity: Yes  Lifestyle  . Physical activity    Days per week: Not on file    Minutes per session: Not on file  . Stress: Not on file  Relationships  . Social Herbalist on phone: Not on file    Gets together: Not on file    Attends religious service: Not on file    Active member of club or organization: Not on file    Attends meetings of clubs or organizations: Not on file    Relationship status: Not on file  . Intimate partner violence    Fear of current or ex partner: Not on file    Emotionally abused: Not on file    Physically abused: Not on file    Forced sexual activity: Not on file  Other Topics Concern  . Not on  file  Social History Narrative   Household: pt, wife 2 y/o child       Allergies as of 05/07/2019   No Known Allergies     Medication List       Accurate as of May 07, 2019 11:59 PM. If you have any questions, ask your nurse or doctor.        acetaminophen 325 MG tablet Commonly known as: TYLENOL Take 650 mg by mouth every 6 (six) hours as needed. Pain   CALCIUM 600 + D PO Take 600 mg by mouth daily.   leuprolide 30 MG injection Commonly known as: LUPRON Inject 30 mg into the muscle every 4 (four) months.   lisinopril-hydrochlorothiazide 20-12.5 MG tablet Commonly known as: ZESTORETIC Take 2 tablets by mouth daily.   meclizine 25 MG tablet Commonly known as: ANTIVERT Take 1 tablet (25 mg total) by mouth 2 (two) times daily as needed.   Shingrix injection Generic drug: Zoster Vaccine Adjuvanted Inject  0.5 mLs into the muscle once for 1 dose. Started by: [name], MD   sildenafil 100 MG tablet Commonly known as: VIAGRA Take 100 mg by mouth daily as needed for erectile dysfunction. Reported on 06/28/2015   VITAMIN B 12 PO Take 1 tablet by mouth daily.           Objective:   Physical Exam BP 138/75 (BP Location: Left Arm, Patient Position: Sitting, Cuff Size: Normal)   Pulse (!) 59   Temp (!) 95.6 F (35.3 C) (Temporal)   Resp 16   Ht 6\' 1"  (1.854 m)   Wt 244 lb (110.7 kg)   SpO2 98%   BMI 32.19 kg/m    General:   Well developed, NAD, BMI noted.  HEENT:  Normocephalic . Face symmetric, atraumatic Neck: No thyromegaly Lungs:  CTA B Normal respiratory effort, no intercostal retractions, no accessory muscle use. Heart: RRR,  no murmur.  no pretibial edema bilaterally  Abdomen:  Not distended, soft, non-tender. No rebound or rigidity.   Skin: Not pale. Not jaundice Neurologic:  alert & oriented X3.  Speech normal, gait appropriate for age and unassisted Psych--  Cognition and judgment appear intact.  Cooperative with normal attention span and concentration.  Behavior appropriate. No anxious or depressed appearing.        Assessment    Assessment hyperglycemia: 01-2016 A1C 6.0 HTN Dyslipidemia Insomnia-- no need for med as off 07/2017 Chronic back pain, offered surgery at some point, declined  GU: --Prostate cancer, Hormone sensitive.DX 2013, PSA 34.25, s/p prostatectomy, lymphadenectomy 2014. S/p XRT; + lymph nods mets , PSA increased, s/p lupron   --Hematuria- CT -cysto 2016 per urology-- likely d/t h/o XRT No transfusions, Jehov[employer] witness Mild anemia, normal iron and ferritin 06-2015 H/o heart murmur, stress echo 2008 negative Syncope, dizziness  headaches:  -Saw neurology 4-17,w/u done, CTA head, neck, then neck MRI: dx w/ spinal compression, saw surgery 10-2015: rx non emergent surgery (no surgery as off 01-2017), finding was not causing HAs-dizzines   -Cervical spinal compression per MRI 09-2015 Shingles, R face, 03-2016, saw ophtalmology  PLAN: Preventive care reviewed Hyperglycemia: Doing well with lifestyle, check A1c HTN: BP today looks good, normal ambulatory BPs, on Zestoretic, check a CMP and CBC Dyslipidemia: Diet controlled, checking labs Dizziness: Sporadic symptoms on meclizine as needed Prostate cancer: PSAs increasing, follow-up closely by urology Tick bites: Prevention discussed, if he ever develop symptoms such as rash, fever, chills, profound fatigue he will let me know. RTC 6 to 8  months   This visit occurred during the SARS-CoV-2 public health emergency.  Safety protocols were in place, including screening questions prior to the visit, additional usage of staff PPE, and extensive cleaning of exam room while observing appropriate contact time as indicated for disinfecting solutions.

## 2019-05-08 NOTE — Assessment & Plan Note (Signed)
Preventive care reviewed Hyperglycemia: Doing well with lifestyle, check A1c HTN: BP today looks good, normal ambulatory BPs, on Zestoretic, check a CMP and CBC Dyslipidemia: Diet controlled, checking labs Dizziness: Sporadic symptoms on meclizine as needed Prostate cancer: PSAs increasing, follow-up closely by urology Tick bites: Prevention discussed, if he ever develop symptoms such as rash, fever, chills, profound fatigue he will let me know. RTC 6 to 8 months

## 2019-05-08 NOTE — Assessment & Plan Note (Signed)
Preventive care reviewed Tdap: 2013 PNM 23: Today Shingrix: Prescription printed. Had a flu shot  CCS: scope 07-2015, +polyps, 5 years per GI letter  H/o prostate ca

## 2019-05-09 MED ORDER — ATORVASTATIN CALCIUM 20 MG PO TABS: 20.0000 mg | ORAL_TABLET | Freq: Every day | ORAL | 1 refills | Status: DC

## 2019-05-09 NOTE — Addendum Note (Signed)
Addended byDamita Dunnings D on: 05/09/2019 12:33 PM   Modules accepted: Orders

## 2019-07-09 ENCOUNTER — Other Ambulatory Visit (HOSPITAL_COMMUNITY): Payer: Self-pay | Admitting: Urology

## 2019-07-09 ENCOUNTER — Other Ambulatory Visit: Payer: Self-pay | Admitting: Urology

## 2019-07-09 DIAGNOSIS — C61 Malignant neoplasm of prostate: Secondary | ICD-10-CM

## 2019-07-18 ENCOUNTER — Other Ambulatory Visit: Payer: Self-pay | Admitting: Internal Medicine

## 2019-07-27 ENCOUNTER — Encounter: Payer: Self-pay | Admitting: Internal Medicine

## 2019-09-30 DIAGNOSIS — C61 Malignant neoplasm of prostate: Secondary | ICD-10-CM | POA: Diagnosis not present

## 2019-09-30 LAB — PSA: PSA: <0.015

## 2019-10-02 ENCOUNTER — Encounter (HOSPITAL_COMMUNITY)
Admission: RE | Admit: 2019-10-02 | Discharge: 2019-10-02 | Disposition: A | Discharge status: Home / Self Care | Payer: Medicare HMO | Source: Ambulatory Visit | Attending: Urology | Admitting: Urology

## 2019-10-02 ENCOUNTER — Other Ambulatory Visit: Payer: Self-pay

## 2019-10-02 DIAGNOSIS — C61 Malignant neoplasm of prostate: Secondary | ICD-10-CM | POA: Diagnosis not present

## 2019-10-02 DIAGNOSIS — K449 Diaphragmatic hernia without obstruction or gangrene: Secondary | ICD-10-CM | POA: Diagnosis not present

## 2019-10-02 DIAGNOSIS — C775 Secondary and unspecified malignant neoplasm of intrapelvic lymph nodes: Secondary | ICD-10-CM | POA: Diagnosis not present

## 2019-10-02 MED ORDER — TECHNETIUM TC 99M MEDRONATE IV KIT
21.6000 | PACK | Freq: Once | INTRAVENOUS | Status: AC
  Administered 2019-10-02: 11:00:00 21.6 via INTRAVENOUS

## 2019-10-07 DIAGNOSIS — C775 Secondary and unspecified malignant neoplasm of intrapelvic lymph nodes: Secondary | ICD-10-CM | POA: Diagnosis not present

## 2019-10-07 DIAGNOSIS — C61 Malignant neoplasm of prostate: Secondary | ICD-10-CM | POA: Diagnosis not present

## 2019-10-07 DIAGNOSIS — Z5111 Encounter for antineoplastic chemotherapy: Secondary | ICD-10-CM | POA: Diagnosis not present

## 2019-11-04 ENCOUNTER — Encounter: Payer: Self-pay | Admitting: Internal Medicine

## 2020-02-04 DIAGNOSIS — C61 Malignant neoplasm of prostate: Secondary | ICD-10-CM | POA: Diagnosis not present

## 2020-02-13 DIAGNOSIS — C775 Secondary and unspecified malignant neoplasm of intrapelvic lymph nodes: Secondary | ICD-10-CM | POA: Diagnosis not present

## 2020-02-13 DIAGNOSIS — C61 Malignant neoplasm of prostate: Secondary | ICD-10-CM | POA: Diagnosis not present

## 2020-03-02 ENCOUNTER — Observation Stay (HOSPITAL_COMMUNITY)
Admission: EM | Admit: 2020-03-02 | Discharge: 2020-03-03 | Disposition: A | Discharge status: Home / Self Care | Payer: Medicare HMO | Attending: Cardiovascular Disease | Admitting: Cardiovascular Disease

## 2020-03-02 ENCOUNTER — Emergency Department (HOSPITAL_BASED_OUTPATIENT_CLINIC_OR_DEPARTMENT_OTHER): Payer: Medicare HMO

## 2020-03-02 ENCOUNTER — Emergency Department (HOSPITAL_COMMUNITY): Payer: Medicare HMO

## 2020-03-02 DIAGNOSIS — I10 Essential (primary) hypertension: Secondary | ICD-10-CM | POA: Diagnosis not present

## 2020-03-02 DIAGNOSIS — Z8546 Personal history of malignant neoplasm of prostate: Secondary | ICD-10-CM | POA: Diagnosis not present

## 2020-03-02 DIAGNOSIS — C61 Malignant neoplasm of prostate: Secondary | ICD-10-CM | POA: Diagnosis present

## 2020-03-02 DIAGNOSIS — I4891 Unspecified atrial fibrillation: Principal | ICD-10-CM | POA: Diagnosis present

## 2020-03-02 DIAGNOSIS — Z20822 Contact with and (suspected) exposure to covid-19: Secondary | ICD-10-CM | POA: Insufficient documentation

## 2020-03-02 DIAGNOSIS — R748 Abnormal levels of other serum enzymes: Secondary | ICD-10-CM | POA: Insufficient documentation

## 2020-03-02 DIAGNOSIS — E785 Hyperlipidemia, unspecified: Secondary | ICD-10-CM | POA: Diagnosis present

## 2020-03-02 DIAGNOSIS — I499 Cardiac arrhythmia, unspecified: Secondary | ICD-10-CM | POA: Diagnosis not present

## 2020-03-02 DIAGNOSIS — I421 Obstructive hypertrophic cardiomyopathy: Secondary | ICD-10-CM

## 2020-03-02 DIAGNOSIS — R079 Chest pain, unspecified: Secondary | ICD-10-CM | POA: Diagnosis not present

## 2020-03-02 DIAGNOSIS — R778 Other specified abnormalities of plasma proteins: Secondary | ICD-10-CM

## 2020-03-02 DIAGNOSIS — R0789 Other chest pain: Secondary | ICD-10-CM | POA: Diagnosis not present

## 2020-03-02 DIAGNOSIS — R000 Tachycardia, unspecified: Secondary | ICD-10-CM | POA: Diagnosis not present

## 2020-03-02 DIAGNOSIS — R0602 Shortness of breath: Secondary | ICD-10-CM | POA: Diagnosis not present

## 2020-03-02 DIAGNOSIS — R002 Palpitations: Secondary | ICD-10-CM | POA: Diagnosis not present

## 2020-03-02 DIAGNOSIS — R9431 Abnormal electrocardiogram [ECG] [EKG]: Secondary | ICD-10-CM

## 2020-03-02 DIAGNOSIS — Z79899 Other long term (current) drug therapy: Secondary | ICD-10-CM | POA: Insufficient documentation

## 2020-03-02 LAB — COMPREHENSIVE METABOLIC PANEL
ALT: 21 U/L (ref 0–44)
AST: 28 U/L (ref 15–41)
Albumin: 4.1 g/dL (ref 3.5–5.0)
Alkaline Phosphatase: 87 U/L (ref 38–126)
Anion gap: 10 (ref 5–15)
BUN: 21 mg/dL (ref 8–23)
CO2: 24 mmol/L (ref 22–32)
Calcium: 9.1 mg/dL (ref 8.9–10.3)
Chloride: 104 mmol/L (ref 98–111)
Creatinine, Ser: 1.47 mg/dL — ABNORMAL HIGH (ref 0.61–1.24)
GFR calc Af Amer: 58 mL/min — ABNORMAL LOW (ref >60)
GFR calc non Af Amer: 50 mL/min — ABNORMAL LOW (ref >60)
Glucose, Bld: 180 mg/dL — ABNORMAL HIGH (ref 70–99)
Potassium: 3.6 mmol/L (ref 3.5–5.1)
Sodium: 138 mmol/L (ref 135–145)
Total Bilirubin: 1 mg/dL (ref 0.3–1.2)
Total Protein: 7.1 g/dL (ref 6.5–8.1)

## 2020-03-02 LAB — CBC
HCT: 40.9 % (ref 39.0–52.0)
Hemoglobin: 13.1 g/dL (ref 13.0–17.0)
MCH: 27 pg (ref 26.0–34.0)
MCHC: 32 g/dL (ref 30.0–36.0)
MCV: 84.2 fL (ref 80.0–100.0)
Platelets: 224 10*3/uL (ref 150–400)
RBC: 4.86 MIL/uL (ref 4.22–5.81)
RDW: 14.1 % (ref 11.5–15.5)
WBC: 7.5 10*3/uL (ref 4.0–10.5)
nRBC: 0 % (ref 0.0–0.2)

## 2020-03-02 LAB — TROPONIN I (HIGH SENSITIVITY)
Troponin I (High Sensitivity): 55 ng/L — ABNORMAL HIGH (ref <18)
Troponin I (High Sensitivity): 195 ng/L (ref <18)

## 2020-03-02 LAB — ECHOCARDIOGRAM COMPLETE
Area-P 1/2: 4.49 cm2
Height: 73 in
S' Lateral: 2.4 cm
Weight: 3968 oz

## 2020-03-02 LAB — MAGNESIUM: Magnesium: 2.3 mg/dL (ref 1.7–2.4)

## 2020-03-02 LAB — RESPIRATORY PANEL BY RT PCR (FLU A&B, COVID)
Influenza A by PCR: NEGATIVE
Influenza B by PCR: NEGATIVE
SARS Coronavirus 2 by RT PCR: NEGATIVE

## 2020-03-02 MED ORDER — ASPIRIN 81 MG PO CHEW
324.0000 mg | CHEWABLE_TABLET | Freq: Once | ORAL | Status: AC
  Administered 2020-03-02: 324 mg via ORAL
  Filled 2020-03-02: qty 4

## 2020-03-02 MED ORDER — ACETAMINOPHEN 325 MG PO TABS: 650.0000 mg | ORAL_TABLET | ORAL | Status: DC | PRN

## 2020-03-02 MED ORDER — DILTIAZEM HCL-DEXTROSE 125-5 MG/125ML-% IV SOLN (PREMIX)
5.0000 mg/h | INTRAVENOUS | Status: DC
  Administered 2020-03-02: 5 mg/h via INTRAVENOUS
  Filled 2020-03-02: qty 125

## 2020-03-02 MED ORDER — DILTIAZEM HCL ER COATED BEADS 240 MG PO CP24
240.0000 mg | ORAL_CAPSULE | Freq: Every day | ORAL | Status: DC
  Administered 2020-03-02: 240 mg via ORAL
  Filled 2020-03-02 (×3): qty 1

## 2020-03-02 MED ORDER — DILTIAZEM LOAD VIA INFUSION
20.0000 mg | Freq: Once | INTRAVENOUS | Status: AC
  Administered 2020-03-02: 20 mg via INTRAVENOUS
  Filled 2020-03-02: qty 20

## 2020-03-02 MED ORDER — NITROGLYCERIN 0.4 MG SL SUBL: 0.4000 mg | SUBLINGUAL_TABLET | SUBLINGUAL | Status: DC | PRN

## 2020-03-02 MED ORDER — ONDANSETRON HCL 4 MG/2ML IJ SOLN: 4.0000 mg | Freq: Four times a day (QID) | INTRAMUSCULAR | Status: DC | PRN

## 2020-03-02 NOTE — Progress Notes (Signed)
  Echocardiogram 2D Echocardiogram has been performed.  David Smith 03/02/2020, 5:25 PM

## 2020-03-02 NOTE — ED Provider Notes (Signed)
[facility] DEPARTMENT Provider Note   CSN: [id number] Arrival date & time: 03/02/20  1428     History Chief Complaint  Patient presents with  . Atrial Fibrillation    David Smith is a 64 y.o. male.  Patient with hx htn arrives via EMS in rapid afib. Patient notes in past few weeks, intermittent episodes palpitations. This AM, played tennis, felt fine during exertion/tennis, and then afterwards felt as if heart was racing. Symptoms acute onset, moderate, constant, persistent, recurrent in past few weeks. Had mild chest pressure sensation earlier, at rest, when hr fast, but otherwise denies exertional cp or discomfort. No unusual doe. Denies personal or fam hx cad. Denies hx dysrhythmia. No etoh use. No recent change in meds. Is being treated for prostate ca. Has been eating, drinking, sleeping per normal routine. No recent wt change. No heat intolerance or sweats. No leg pain or swelling. No hx dvt or pe. No cough or uri symptoms.  EMS notes initial hr in 170s, gave cardizem iv with some improvement in rate. No current anticoag therapy. No chest pain or discomfort currently.   The history is provided by the patient and the EMS personnel.       Past Medical History:  Diagnosis Date  . Chronic lower back pain 06-11-12   daily a problem Pain level 7, Tylenol helpful(down to level 2)  . H/O cardiovascular stress test    non ischemic EF 55% 05-15-2007  . Heart murmur    dx as a teen, Stress ECHO 2008 (-)   . Hypertension   . Prostate cancer metastatic to intrapelvic lymph node (HCC)    gleason 4+3=7, volume 56.4 cc, PSA 34.25    Patient Active Problem List   Diagnosis Date Noted  . Spinal cord compression ([place]) 01/10/2016  . PCP NOTES >>>>>>>>>>>>>>>>>>>>>>>> 08/18/2015  . Other malaise and fatigue 11/14/2013  . Prostate cancer ([place])   . Annual physical exam 11/23/2011  . Hip pain 09/29/2011  . Hyperlipidemia 06/17/2007  . HYPERTENSION, BENIGN  ESSENTIAL 04/12/2007    Past Surgical History:  Procedure Laterality Date  . ACHILLES TENDON REPAIR     26 yrs ago, right foot  . ANAL FISSURECTOMY  2008  . dental procedure  06-11-12   minor -office procedure to correct jaw issue-used anesthesia gas to do  . LYMPHADENECTOMY  06/17/2012   Procedure: LYMPHADENECTOMY;  Surgeon: [name], MD;  Location: [facility];  Service: Urology;  Laterality: Bilateral;  . PROSTATE BIOPSY    . ROBOT ASSISTED LAPAROSCOPIC RADICAL PROSTATECTOMY  06/17/2012   Procedure: ROBOTIC ASSISTED LAPAROSCOPIC RADICAL PROSTATECTOMY LEVEL 2;  Surgeon: [name], MD;  Location: [facility];  Service: Urology;  Laterality: N/A;          Family History  Problem Relation Age of Onset  . Cancer Mother        breast  . Heart disease Mother   . Cancer Father 20       prostate, removed  . Cancer Brother 65       prostate, tx w/xrt  . Colon cancer Neg Hx   . Colon polyps Neg Hx     Social History   Tobacco Use  . Smoking status: Never Smoker  . Smokeless tobacco: Never Used  Substance Use Topics  . Alcohol use: Yes    Alcohol/week: 0.0 standard drinks    Comment: rare   . Drug use: No    Home Medications Prior to Admission medications  Medication Sig Start Date End Date Taking? Authorizing Provider  acetaminophen (TYLENOL) 325 MG tablet Take 650 mg by mouth every 6 (six) hours as needed. Pain    [provider]  atorvastatin (LIPITOR) 20 MG tablet Take 1 tablet (20 mg total) by mouth at bedtime. 05/09/19   Colon Branch, MD  Calcium Carb-Cholecalciferol (CALCIUM 600 + D PO) Take 600 mg by mouth daily.    [provider]  Cyanocobalamin (VITAMIN B 12 PO) Take 1 tablet by mouth daily.    [provider]  leuprolide (LUPRON) 30 MG injection Inject 30 mg into the muscle every 4 (four) months.    Raynelle Bring, MD  lisinopril-hydrochlorothiazide (ZESTORETIC) 20-12.5 MG tablet Take 2 tablets by mouth daily. 07/18/19   Colon Branch, MD    meclizine (ANTIVERT) 25 MG tablet Take 1 tablet (25 mg total) by mouth 2 (two) times daily as needed. 03/27/16   Colon Branch, MD  sildenafil (VIAGRA) 100 MG tablet Take 100 mg by mouth daily as needed for erectile dysfunction. Reported on 06/28/2015    [provider]    Allergies    Patient has no known allergies.  Review of Systems   Review of Systems  Constitutional: Negative for fever.  HENT: Negative for sore throat.   Eyes: Negative for redness.  Respiratory: Negative for cough and shortness of breath.   Cardiovascular: Positive for palpitations. Negative for leg swelling.  Gastrointestinal: Negative for abdominal pain, blood in stool, diarrhea and vomiting.  Genitourinary: Negative for dysuria and flank pain.  Musculoskeletal: Negative for back pain and neck pain.  Skin: Negative for rash.  Neurological: Negative for syncope and headaches.  Hematological: Does not bruise/bleed easily.  Psychiatric/Behavioral: Negative for confusion.    Physical Exam Updated Vital Signs BP 118/72   Pulse (!) 148   Temp 99.1 F (37.3 C) (Oral)   Resp (!) 21   Ht 1.854 m (6\' 1" )   Wt 112.5 kg   SpO2 97%   BMI 32.72 kg/m   Physical Exam Vitals and nursing note reviewed.  Constitutional:      Appearance: Normal appearance. He is well-developed.  HENT:     Head: Atraumatic.     Nose: Nose normal.     Mouth/Throat:     Mouth: Mucous membranes are moist.     Pharynx: Oropharynx is clear.  Eyes:     General: No scleral icterus.    Conjunctiva/sclera: Conjunctivae normal.     Pupils: Pupils are equal, round, and reactive to light.  Neck:     Trachea: No tracheal deviation.     Comments: Thyroid not grossly enlarged or tender.  Cardiovascular:     Rate and Rhythm: Tachycardia present. Rhythm irregular.     Pulses: Normal pulses.     Heart sounds: Normal heart sounds. No murmur heard.  No friction rub. No gallop.   Pulmonary:     Effort: Pulmonary effort is normal. No  accessory muscle usage or respiratory distress.     Breath sounds: Normal breath sounds.  Abdominal:     General: Bowel sounds are normal. There is no distension.     Palpations: Abdomen is soft.     Tenderness: There is no abdominal tenderness. There is no guarding.  Genitourinary:    Comments: No cva tenderness. Musculoskeletal:        General: No swelling or tenderness.     Cervical back: Normal range of motion and neck supple. No rigidity.  Right lower leg: No edema.     Left lower leg: No edema.  Skin:    General: Skin is warm and dry.     Findings: No rash.  Neurological:     Mental Status: He is alert.     Comments: Alert, speech clear. Motor/sens grossly intact bil.   Psychiatric:        Mood and Affect: Mood normal.     ED Results / Procedures / Treatments   Labs (all labs ordered are listed, but only abnormal results are displayed) Results for orders placed or performed during the hospital encounter of 03/02/20  CBC  Result Value Ref Range   WBC 7.5 4.0 - 10.5 K/uL   RBC 4.86 4.22 - 5.81 MIL/uL   Hemoglobin 13.1 13.0 - 17.0 g/dL   HCT 40.9 39 - 52 %   MCV 84.2 80.0 - 100.0 fL   MCH 27.0 26.0 - 34.0 pg   MCHC 32.0 30.0 - 36.0 g/dL   RDW 14.1 11.5 - 15.5 %   Platelets 224 150 - 400 K/uL   nRBC 0.0 0.0 - 0.2 %  Comprehensive metabolic panel  Result Value Ref Range   Sodium 138 135 - 145 mmol/L   Potassium 3.6 3.5 - 5.1 mmol/L   Chloride 104 98 - 111 mmol/L   CO2 24 22 - 32 mmol/L   Glucose, Bld 180 (H) 70 - 99 mg/dL   BUN 21 8 - 23 mg/dL   Creatinine, Ser 1.47 (H) 0.61 - 1.24 mg/dL   Calcium 9.1 8.9 - 10.3 mg/dL   Total Protein 7.1 6.5 - 8.1 g/dL   Albumin 4.1 3.5 - 5.0 g/dL   AST 28 15 - 41 U/L   ALT 21 0 - 44 U/L   Alkaline Phosphatase 87 38 - 126 U/L   Total Bilirubin 1.0 0.3 - 1.2 mg/dL   GFR calc non Af Amer 50 (L) >60 mL/min   GFR calc Af Amer 58 (L) >60 mL/min   Anion gap 10 5 - 15  Troponin I (High Sensitivity)  Result Value Ref Range    Troponin I (High Sensitivity) 55 (H) <18 ng/L   DG Chest Port 1 View  Result Date: 03/02/2020 CLINICAL DATA:  Shortness of breath EXAM: PORTABLE CHEST 1 VIEW COMPARISON:  10/02/2019 FINDINGS: Cardiac shadow is enlarged but stable. The lungs are well aerated bilaterally. No focal infiltrate or sizable effusion is seen. Rounded extrinsic artifact is noted over the left mid lung. This was not appreciated on the recent CT examination and is likely related to the EKG clips. No bony abnormality is noted. IMPRESSION: No acute abnormality noted. Electronically Signed   By: Inez Catalina M.D.   On: 03/02/2020 15:23    EKG EKG Interpretation  Date/Time:  Tuesday March 02 2020 14:32:29 EDT Ventricular Rate:  162 PR Interval:    QRS Duration: 98 QT Interval:  314 QTC Calculation: 516 R Axis:   -64 Text Interpretation: Atrial fibrillation with rapid V-rate Left anterior fascicular block Nonspecific ST abnormality Confirmed by Lajean Saver [phone]) on 03/02/2020 2:38:26 PM   Radiology DG Chest Port 1 View  Result Date: 03/02/2020 CLINICAL DATA:  Shortness of breath EXAM: PORTABLE CHEST 1 VIEW COMPARISON:  10/02/2019 FINDINGS: Cardiac shadow is enlarged but stable. The lungs are well aerated bilaterally. No focal infiltrate or sizable effusion is seen. Rounded extrinsic artifact is noted over the left mid lung. This was not appreciated on the recent CT examination and is likely related  to the EKG clips. No bony abnormality is noted. IMPRESSION: No acute abnormality noted. Electronically Signed   By: Inez Catalina M.D.   On: 03/02/2020 15:23    Procedures Procedures (including critical care time)  Medications Ordered in ED Medications  diltiazem (CARDIZEM) 1 mg/mL load via infusion 20 mg (has no administration in time range)    And  diltiazem (CARDIZEM) 125 mg in dextrose 5% 125 mL (1 mg/mL) infusion (has no administration in time range)    ED Course  I have reviewed the triage vital signs and  the nursing notes.  Pertinent labs & imaging results that were available during my care of the patient were reviewed by me and considered in my medical decision making (see chart for details).    MDM Rules/Calculators/A&P                         Iv ns. Continuous pulse ox and cardiac monitor. Stat labs and imaging. Ecg. Asa.  Reviewed nursing notes and prior charts for additional history.   Cardizem bolus and gtt.  As intermittent palpitations, possible afib symptoms in past few weeks, unclear time of onset symptoms, not on anticoag therapy, do not feel can safely cardiovert in ED.    Pt indicates has been to [facility] in past, prior stress test - will consult cardiology.  Discussed pt with cardiology - will consult/see in ED.   Labs reviewed/interpreted by me - chem normal. Initial trop mildly elev ?rate related - no current cp or discomfort.   CXR reviewed/interpreted by me -  No pna.   Recheck pt, heart rate controlled, still afib.  Still no chest pain or discomfort.   Await cardiology eval, and delta trop. Anticipate that likely patient will need admission to medical service - signed out to Dr Vanita Panda to f/u on remaining labs, delta trop, cardiology consult, and response to cardizem, and then dispo appropriately.      Final Clinical Impression(s) / ED Diagnoses Final diagnoses:  None    Rx / DC Orders ED Discharge Orders    None       Lajean Saver, MD 03/02/20 1558

## 2020-03-02 NOTE — ED Triage Notes (Signed)
Pt arrived via EMS from home, in afib in rvr with hr in the 170s upon their arrival. Pt given 20mg  in total of diltiazem by EMS and 462ml of NS. Pt was playing tennis and felt chest pressure afterwards and felt like his heart was racing. Pt woke up today feeling some numbness and tingling.

## 2020-03-02 NOTE — H&P (Addendum)
Cardiology Admission History and Physical:   Patient ID: David Smith MRN: [id number]; DOB: 1956-05-25   Admission date: 03/02/2020  Primary Care Provider: Colon Branch, MD [facility] Cardiologist: New (David Smith). Previously seen by David Smith in remote past. [place] Electrophysiologist:  None   Chief Complaint: palpitations and chest pain  Patient Profile:   David Smith is a 64 y.o. male with heart murmur diagnosed as a teen, hypertension, dyslipidemia, pre-diabetes, vertigo chronic anemia, chronic low back pain, and prostate cancer who is been seen today for evaluation of atrial fibrillation with RVR at the request of David Smith (Emergency Department).   History of Present Illness:   David Smith is a 64 year old male with the above history. Previously seen by David Smith in the remote past. Cannot access any of these notes. Patient states he saw David Smith because he was significant weakness, fatigue, and near syncope. He reportedly had a couple of stress test which were normal. He has not been seen by Cardiology in what looks like over 10 years.   Patient presented to the ED today via EMS for palpitations. Upon EMS arrival, he was noted to be in atrial fibrillation with rates in the 170's. Patient was in his usual stated of health until this afternoon. He states he woke up feeling fine. He went to play tennis and did not having any problems with that. He then went back home and took a nap. When he woke up, he had a violent cough and then his heart started racing. He checked his vitals signs. BP was 144/85 but heart rate was 168 bpm. He states he occasional has palpitations but nothing like this and nothing that lasted this long. There was mention of chest pressure in the triage note. However, patient denies any chest discomfort with me and just describes palpitation. He felt a little lightheaded with this but no near syncope. No shortness of breath, diaphoresis, nausea, or vomiting.  He has never had a episode like this. No orthopnea, PND, or edema. He has vertigo and so will have dizziness (room spinning) occasionally but no syncope. He take Meclizine as needed. No recent fevers or illnesses. No abnormal bleeding in urine or stools. Patient does note 3-4 episodes over the last 2 months of numbness in bilateral arms and legs that resolve after having a bowel movement. Denies any other stroke symptoms including slurred speech, facial droop, headache, vision changes, unilateral weakness (patient states his wife has had several TIAs so he is aware of what stroke symptoms to look for).  Upon arrival to the ED, patient tachycardic otherwise vitals stable. EKG showed atrial fibrillation, rate of 162 bpm, with ST elevation in aVR and diffuse ST depression. High-sensitivity troponin minimally elevated at 55. Chest x-ray showed no acute findings. WBC 7.5, Hgb 13.1, Plts 224. Na 138, K 3.6, Glucose 180, BUN 21, Cr 1.47. LFTs normal. COVID-19 testing pending. Patient was started on IV Cardizem and Cardiology consulted for further evaluation.  Patient converted to sinus rhythm with rates in the 90's to low 100's while I was in the room. He noted a difference after he converted with improvement in palpitations.   No history of tobacco use or drug use. No alcohol use in the last several years. He does have a family history of heart disease with his mother having CHF. No other known family history of CHF.   Past Medical History:  Diagnosis Date  . Chronic lower back pain 06-11-12  daily a problem Pain level 7, Tylenol helpful(down to level 2)  . H/O cardiovascular stress test    non ischemic EF 55% 05-15-2007  . Heart murmur    dx as a teen, Stress ECHO 2008 (-)   . Hypertension   . Prostate cancer metastatic to intrapelvic lymph node (HCC)    gleason 4+3=7, volume 56.4 cc, PSA 34.25    Past Surgical History:  Procedure Laterality Date  . ACHILLES TENDON REPAIR     26 yrs ago, right foot   . ANAL FISSURECTOMY  2008  . dental procedure  06-11-12   minor -office procedure to correct jaw issue-used anesthesia gas to do  . LYMPHADENECTOMY  06/17/2012   Procedure: LYMPHADENECTOMY;  Surgeon: [name], MD;  Location: [facility];  Service: Urology;  Laterality: Bilateral;  . PROSTATE BIOPSY    . ROBOT ASSISTED LAPAROSCOPIC RADICAL PROSTATECTOMY  06/17/2012   Procedure: ROBOTIC ASSISTED LAPAROSCOPIC RADICAL PROSTATECTOMY LEVEL 2;  Surgeon: [name], MD;  Location: [facility];  Service: Urology;  Laterality: N/A;        Medications Prior to Admission: Prior to Admission medications   Medication Sig Start Date End Date Taking? Authorizing Provider  acetaminophen (TYLENOL) 325 MG tablet Take 650 mg by mouth every 6 (six) hours as needed. Pain    [provider]  atorvastatin (LIPITOR) 20 MG tablet Take 1 tablet (20 mg total) by mouth at bedtime. 05/09/19   David Branch, MD  Calcium Carb-Cholecalciferol (CALCIUM 600 + D PO) Take 600 mg by mouth daily.    [provider]  Cyanocobalamin (VITAMIN B 12 PO) Take 1 tablet by mouth daily.    [provider]  leuprolide (LUPRON) 30 MG injection Inject 30 mg into the muscle every 4 (four) months.    Raynelle Bring, MD  lisinopril-hydrochlorothiazide (ZESTORETIC) 20-12.5 MG tablet Take 2 tablets by mouth daily. 07/18/19   David Branch, MD  meclizine (ANTIVERT) 25 MG tablet Take 1 tablet (25 mg total) by mouth 2 (two) times daily as needed. 03/27/16   David Branch, MD  sildenafil (VIAGRA) 100 MG tablet Take 100 mg by mouth daily as needed for erectile dysfunction. Reported on 06/28/2015    [provider]     Allergies:   No Known Allergies  Social History:   Social History   Socioeconomic History  . Marital status: Married    Spouse name: Not on file  . Number of children: 3  . Years of education: Not on file  . Highest education level: Not on file  Occupational History  . Occupation: disability d/t back pain-not  working at present  Tobacco Use  . Smoking status: Never Smoker  . Smokeless tobacco: Never Used  Substance and Sexual Activity  . Alcohol use: Yes    Alcohol/week: 0.0 standard drinks    Comment: rare   . Drug use: No  . Sexual activity: Yes  Other Topics Concern  . Not on file  Social History Narrative   Household: pt, wife 59 y/o child    Social Determinants of Radio broadcast assistant Strain:   . Difficulty of Paying Living Expenses: Not on file  Food Insecurity:   . Worried About Charity fundraiser in the Last Year: Not on file  . Ran Out of Food in the Last Year: Not on file  Transportation Needs:   . Lack of Transportation (Medical): Not on file  . Lack of Transportation (Non-Medical): Not on file  Physical Activity:   . Days of Exercise per Week: Not on file  . Minutes of Exercise per Session: Not on file  Stress:   . Feeling of Stress : Not on file  Social Connections:   . Frequency of Communication with Friends and Family: Not on file  . Frequency of Social Gatherings with Friends and Family: Not on file  . Attends Religious Services: Not on file  . Active Member of Clubs or Organizations: Not on file  . Attends Archivist Meetings: Not on file  . Marital Status: Not on file  Intimate Partner Violence:   . Fear of Current or Ex-Partner: Not on file  . Emotionally Abused: Not on file  . Physically Abused: Not on file  . Sexually Abused: Not on file    Family History:   The patient's family history includes Cancer in his mother; Cancer (age of onset: 4) in his brother; Cancer (age of onset: 59) in his father; Heart disease in his mother. There is no history of David cancer or David polyps.    ROS:  Please see the history of present illness.  All other ROS reviewed and negative.     Physical Exam/Data:   Vitals:   03/02/20 1432 03/02/20 1433 03/02/20 1442 03/02/20 1515  BP: 118/72   108/79  Pulse: (!) 148   (!) 110  Resp: (!) 21   20    Temp:   99.1 F (37.3 C)   TempSrc:   Oral   SpO2: 97%   99%  Weight:  112.5 kg    Height:  6\' 1"  (1.854 m)     No intake or output data in the 24 hours ending 03/02/20 1519 Last 3 Weights 03/02/2020 05/07/2019 01/31/2018  Weight (lbs) 248 lb 244 lb 247 lb 9.6 oz  Weight (kg) 112.492 kg 110.678 kg 112.311 kg     Body mass index is 32.72 kg/m.  General: 64 y.o. male resting comfortably in no acute distress.  HEENT: Normocephalic and atraumatic. Sclera clear.  Heart: RRR. Distinct S1 and S2. Loud III/VI holosystolic murmur loudest at the apex and radiates to left axilla. No gallops or rubs. Radial and posterior tibial pulses 2+ and equal bilaterally. Lungs: No increased work of breathing. Clear to ausculation bilaterally. No wheezes, rhonchi, or rales.  Abdomen: Soft, non-distended, and non-tender to palpation. Bowel sounds present. MSK: Normal strength and tone for age. Extremities: No lower extremity edema.    Skin: Warm and dry. Neuro: Alert and oriented x3. No focal deficits. Psych: Normal affect. Responds appropriately.  EKG:  The ECG that was done was personally reviewed and demonstrates atrial fibrillation, rate of 162 bpm, with ST elevation in aVR and diffuse ST depression  Telemetry: Telemetry was personally reviewed and demonstrates Patient now in sinus rhythm with rates in the 90's to low 100's. When in atrial fibrillations rates were in the 130's to 160's.   Relevant CV Studies: None.  Laboratory Data:  High Sensitivity Troponin:  No results for input(s): TROPONINIHS in the last 720 hours.    ChemistryNo results for input(s): NA, K, CL, CO2, GLUCOSE, BUN, CREATININE, CALCIUM, GFRNONAA, GFRAA, ANIONGAP in the last 168 hours.  No results for input(s): PROT, ALBUMIN, AST, ALT, ALKPHOS, BILITOT in the last 168 hours. Hematology Recent Labs  Lab 03/02/20 1448  WBC 7.5  RBC 4.86  HGB 13.1  HCT 40.9  MCV 84.2  MCH 27.0  MCHC 32.0  RDW 14.1  PLT 224   BNPNo  results  for input(s): BNP, PROBNP in the last 168 hours.  DDimer No results for input(s): DDIMER in the last 168 hours.   Radiology/Studies:  No results found.   Assessment and Plan:   Atrial Fibrillation with RVR - Patient presented with palpitations and chest pressure and found to be in atrial fibrillation with RVR.  - Patient converted to sinus rhythm after being started on IV Cardizem. - Potassium 3.6.  - Will check Magnesium. - TSH pending. - Will check BNP as well. - Formal Echo read pending but per David Smith revealed concerns for HOCM. - Currently IV Cardizem tonight. Will start PO Cardizem CD 240mg  daily and then stop drip. - CHA2DS2-VASc = 1 (HTN) but also has prostate cancer with HOCM needs to be anticoagulated independent of CHA2DS2-VASc . Will start Eliquis 5mg  twice daily.  HOCM - Echo shows concerns for HOCM.  - Will get cardiac MRI tomorrow.  Demand Ischemia - Initial high-sensitivity troponin minimally elevated at 55. Will repeat.  - Initial EKG when in atrial fibrillation with RVR showed significant ST elevation in AVR and diffuse ST depression. Once back in sinus rhythm, these changes were much improved. - No chest pain.  - Formal echo read pending.  - Suspect demand ischemia due to atrial fibrillation with RVR and likely HOCM.  Hypertension - BP well controlled. - Continue Cardizem as above. - Will hold home Lisinopril-HCTZ.   Dyslipidemia - Lipid panel in 05/2019: Total Cholesterol 203, Triglycerides 66, HDL 42, LDL 148.  - Previously on Lipitor 20mg  daily but patient states he is no longer taking this. Will check fasting lipid panel in the morning.  Pre-Diabetes - Hemoglobin A1c 5.8% in 05/2019 consistent with prediabetes. - Recommend lifestyle modifications.  CKD Stage III - Creatinine 1.47. Baseline around 1.2 to 1.4.  - Continue to monitor.  Prostate Cancer - Being treated with Lupron.  - Followed by Dr. Alinda Money at [facility]  Severity of  Illness: The appropriate patient status for this patient is OBSERVATION. Observation status is judged to be reasonable and necessary in order to provide the required intensity of service to ensure the patient's safety. The patient's presenting symptoms, physical exam findings, and initial radiographic and laboratory data in the context of their medical condition is felt to place them at decreased risk for further clinical deterioration. Furthermore, it is anticipated that the patient will be medically stable for discharge from the hospital within 2 midnights of admission. The following factors support the patient status of observation.   " The patient's presenting symptoms include palpitations and shortness of breath. " The physical exam findings include atrial fibrillation. " The initial radiographic and laboratory data as above.     For questions or updates, please contact [place] Please consult [email or web address] for contact info under     Signed, Darreld Mclean, PA-C  03/02/2020 3:19 PM

## 2020-03-03 ENCOUNTER — Observation Stay (INDEPENDENT_AMBULATORY_CARE_PROVIDER_SITE_OTHER): Payer: Medicare HMO

## 2020-03-03 ENCOUNTER — Telehealth (HOSPITAL_COMMUNITY): Payer: Self-pay

## 2020-03-03 ENCOUNTER — Observation Stay (HOSPITAL_COMMUNITY): Payer: Medicare HMO

## 2020-03-03 DIAGNOSIS — I4891 Unspecified atrial fibrillation: Secondary | ICD-10-CM | POA: Diagnosis not present

## 2020-03-03 DIAGNOSIS — I421 Obstructive hypertrophic cardiomyopathy: Secondary | ICD-10-CM | POA: Diagnosis not present

## 2020-03-03 LAB — LIPID PANEL
Cholesterol: 193 mg/dL (ref 0–200)
HDL: 44 mg/dL (ref >40)
LDL Cholesterol: 137 mg/dL — ABNORMAL HIGH (ref 0–99)
Total CHOL/HDL Ratio: 4.4 RATIO
Triglycerides: 59 mg/dL (ref <150)
VLDL: 12 mg/dL (ref 0–40)

## 2020-03-03 LAB — BASIC METABOLIC PANEL
Anion gap: 11 (ref 5–15)
BUN: 18 mg/dL (ref 8–23)
CO2: 24 mmol/L (ref 22–32)
Calcium: 9.1 mg/dL (ref 8.9–10.3)
Chloride: 103 mmol/L (ref 98–111)
Creatinine, Ser: 1.32 mg/dL — ABNORMAL HIGH (ref 0.61–1.24)
GFR calc Af Amer: >60 mL/min (ref >60)
GFR calc non Af Amer: 57 mL/min — ABNORMAL LOW (ref >60)
Glucose, Bld: 112 mg/dL — ABNORMAL HIGH (ref 70–99)
Potassium: 4.1 mmol/L (ref 3.5–5.1)
Sodium: 138 mmol/L (ref 135–145)

## 2020-03-03 LAB — BRAIN NATRIURETIC PEPTIDE: B Natriuretic Peptide: 193.4 pg/mL — ABNORMAL HIGH (ref 0.0–100.0)

## 2020-03-03 LAB — TSH: TSH: 1.538 u[IU]/mL (ref 0.350–4.500)

## 2020-03-03 MED ORDER — APIXABAN 5 MG PO TABS
5.0000 mg | ORAL_TABLET | Freq: Two times a day (BID) | ORAL | Status: DC
  Administered 2020-03-03: 5 mg via ORAL
  Filled 2020-03-03: qty 1

## 2020-03-03 MED ORDER — GADOBUTROL 1 MMOL/ML IV SOLN
12.0000 mL | Freq: Once | INTRAVENOUS | Status: AC | PRN
  Administered 2020-03-03: 12 mL via INTRAVENOUS

## 2020-03-03 MED ORDER — APIXABAN 5 MG PO TABS: 5.0000 mg | ORAL_TABLET | Freq: Two times a day (BID) | ORAL | 11 refills | Status: DC

## 2020-03-03 MED ORDER — DILTIAZEM HCL ER COATED BEADS 240 MG PO CP24: 240.0000 mg | ORAL_CAPSULE | Freq: Every day | ORAL | 6 refills | Status: DC

## 2020-03-03 MED FILL — CARTIA XT 240 MG CAPSULE: 240 | 30 days supply | Qty: 30 | Fill #0

## 2020-03-03 MED FILL — ELIQUIS 5 MG TABLET: 5 | 30 days supply | Qty: 60 | Fill #0

## 2020-03-03 NOTE — Telephone Encounter (Signed)
Contacted patient regarding follow up appointment from the ED. He is scheduled to see Ceasar Lund on October 4th @ 10:00am. Patient was given directions and the parking code to access the garage to park. Patient verbalized understanding.

## 2020-03-03 NOTE — Discharge Instructions (Signed)

## 2020-03-03 NOTE — Progress Notes (Signed)
Zio patch placed onto patient.  All instructions and information reviewed with patient, they verbalize understanding with no questions. 

## 2020-03-03 NOTE — ED Notes (Signed)
Patient verbalizes understanding of discharge instructions. Opportunity for questioning and answers were provided. Armband removed by staff, pt discharged from ED and ambulated to lobby to return home with family.  

## 2020-03-03 NOTE — ED Notes (Signed)
Report attempted x 1

## 2020-03-03 NOTE — Discharge Summary (Signed)
Discharge Summary    Patient ID: David Smith MRN: [id number]; DOB: 1956-02-26  Admit date: 03/02/2020 Discharge date: 03/03/2020  Primary Care Provider: Colon Branch, MD  Primary Cardiologist: Evalina Field, MD   Discharge Diagnoses    Principal Problem:   Atrial fibrillation with RVR [facility]) Active Problems:   Hyperlipidemia   HYPERTENSION, BENIGN ESSENTIAL   Prostate cancer ([place])   HOCM (hypertrophic obstructive cardiomyopathy) ([place])  Diagnostic Studies/Procedures    Echo 03/02/20 1. Left ventricular ejection fraction, by estimation, is 70 to 75%. The  left ventricle has hyperdynamic function. The left ventricle has no  regional wall motion abnormalities. There is severe left ventricular  hypertrophy of the basal-septal segment.  This measures at least 2.5 cm. There is LVOT obstruction at rest  consistent with HOCM. Peak gradient across the LVOT is 57 mmHg. Valsalva  was not performed. Left ventricular diastolic parameters are consistent  with Grade I diastolic dysfunction (impaired  relaxation). Elevated left ventricular end-diastolic pressure.  2. Right ventricular systolic function is normal. The right ventricular  size is normal.  3. Systolic anterior motion (SAM) of the anterior leaflet is noted. The  mitral valve is abnormal. Trivial mitral valve regurgitation.  4. The aortic valve is tricuspid. Aortic valve regurgitation is trivial.  Mild to moderate aortic valve sclerosis/calcification is present, without  any evidence of aortic stenosis.  5. Aortic dilatation noted. There is mild dilatation of the ascending  aorta, measuring 41 mm.    Cardiac MRI done, pending result   History of Present Illness     David Smith is a 64 y.o. male with heart murmur diagnosed as a teen, hypertension, dyslipidemia, pre-diabetes, vertigo chronic anemia, chronic low back pain, and prostate cancer presented for palpitations and found to be afib RVR.   Remotely seen by  Dr. Verl Blalock and had normal stress test.   Patient presented to the ED via EMS for palpitations. Upon EMS arrival, he was noted to be in atrial fibrillation with rates in the 170's. Patient was in his usual stated of health until afternoon. He states he woke up feeling fine. He went to play tennis and did not having any problems with that. He then went back home and took a nap. When he woke up, he had a violent cough and then his heart started racing. He checked his vitals signs. BP was 144/85 but heart rate was 168 bpm. He states he occasional has palpitations but nothing like this and nothing that lasted this long. He felt a little lightheaded with this but no near syncope. No shortness of breath, diaphoresis, nausea, or vomiting. He has never had a episode like this. No orthopnea, PND, or edema. He has vertigo and so will have dizziness (room spinning) occasionally but no syncope. He take Meclizine as needed. No recent fevers or illnesses. No abnormal bleeding in urine or stools. Patient does note 3-4 episodes over the last 2 months of numbness in bilateral arms and legs that resolve after having a bowel movement. Denies any other stroke symptoms including slurred speech, facial droop, headache, vision changes, unilateral weakness (patient states his wife has had several TIAs so he is aware of what stroke symptoms to look for).  Upon arrival to the ED, patient tachycardic otherwise vitals stable. EKG showed atrial fibrillation, rate of 162 bpm, with ST elevation in aVR and diffuse ST depression. High-sensitivity troponin minimally elevated at 55. Chest x-ray showed no acute findings. WBC 7.5, Hgb 13.1, Plts 224. Na  138, K 3.6, Glucose 180, BUN 21, Cr 1.47. LFTs normal. COVID-19 testing pending. Patient was started on IV Cardizem and Cardiology consulted for further evaluation.  Patient converted to sinus rhythm with rates in the 90's to low 100's. He noted a difference after he converted with improvement in  palpitations.   No history of tobacco use or drug use. No alcohol use in the last several years. He does have a family history of heart disease with his mother having CHF. No other known family history of CHF.  Hospital Course     Consultants: None  1. New onset atrial fibrillation Few days hx of palpitations. Converted back to sinus rhythm on cardizem drip.  -His echo showednormal EF with evidence of hypertrophic obstructive cardiomyopathy.  He has outflow tract obstruction up to 57 mmHg. He was started on cardizem and Eliquis. TSH normal BNP 193. He was placed on 2 weeks of Zio at discharge.    2. Elevated troponin  -Initial troponin 55>>195.  Suspected this is demand ischemia in the setting of hypertrophic cardiomyopathy and afib RVR.   3.  Hypertrophic obstructive cardiomyopathy -Echo showed LVEF of 70-75%. There is severe left ventricular  hypertrophy of the basal-septal segment. This measures at least 2.5 cm. There is LVOT obstruction at rest  consistent with HOCM. Peak gradient across the LVOT is 57 mmHg. Valsalva  was not performed. Left ventricular diastolic parameters are consistent  with Grade I diastolic dysfunction.  - Cardiac MRI done, pending reading - Placed on 2 weeks of Zio  Did the patient have an acute coronary syndrome (MI, NSTEMI, STEMI, etc) this admission?:  No.   The elevated Troponin was due to the acute medical illness (demand ischemia).  _____________  Discharge Vitals Blood pressure 101/67, pulse 68, temperature 98.4 F (36.9 C), temperature source Oral, resp. rate 16, height 6\' 1"  (1.854 m), weight 112.5 kg, SpO2 98 %.  Filed Weights   03/02/20 1433  Weight: 112.5 kg    Labs & Radiologic Studies    CBC Recent Labs    03/02/20 1448  WBC 7.5  HGB 13.1  HCT 40.9  MCV 84.2  PLT 627   Basic Metabolic Panel Recent Labs    03/02/20 1448 03/02/20 1743 03/03/20 0539  NA 138  --  138  K 3.6  --  4.1  CL 104  --  103  CO2 24  --  24    GLUCOSE 180*  --  112*  BUN 21  --  18  CREATININE 1.47*  --  1.32*  CALCIUM 9.1  --  9.1  MG  --  2.3  --    Liver Function Tests Recent Labs    03/02/20 1448  AST 28  ALT 21  ALKPHOS 87  BILITOT 1.0  PROT 7.1  ALBUMIN 4.1   High Sensitivity Troponin:   Recent Labs  Lab 03/02/20 1448 03/02/20 1743  TROPONINIHS 55* 195*    Fasting Lipid Panel Recent Labs    03/03/20 0539  CHOL 193  HDL 44  LDLCALC 137*  TRIG 59  CHOLHDL 4.4   Thyroid Function Tests Recent Labs    03/02/20 2355  TSH 1.538   _____________  DG Chest Port 1 View  Result Date: 03/02/2020 CLINICAL DATA:  Shortness of breath EXAM: PORTABLE CHEST 1 VIEW COMPARISON:  10/02/2019 FINDINGS: Cardiac shadow is enlarged but stable. The lungs are well aerated bilaterally. No focal infiltrate or sizable effusion is seen. Rounded extrinsic artifact is noted over  the left mid lung. This was not appreciated on the recent CT examination and is likely related to the EKG clips. No bony abnormality is noted. IMPRESSION: No acute abnormality noted. Electronically Signed   By: Inez Catalina M.D.   On: 03/02/2020 15:23   ECHOCARDIOGRAM COMPLETE  Result Date: 03/02/2020    ECHOCARDIOGRAM REPORT   Patient Name:   David Smith Date of Exam: 03/02/2020 Medical Rec #:  [id number]       Height:       73.0 in Accession #:    [id number]      Weight:       248.0 lb Date of Birth:  29-Dec-1955        BSA:          2.358 m Patient Age:    52 years        BP:           112/67 mmHg Patient Gender: M               HR:           83 bpm. Exam Location:  Inpatient Procedure: 2D Echo Indications:    a-fib. murmur  History:        Patient has no prior history of Echocardiogram examinations.                 Signs/Symptoms:Murmur; Risk Factors:Hypertension.  Sonographer:    Jannett Celestine RDCS (AE) Referring Phys: [id number] [place]  1. Left ventricular ejection fraction, by estimation, is 70 to 75%. The left ventricle has  hyperdynamic function. The left ventricle has no regional wall motion abnormalities. There is severe left ventricular hypertrophy of the basal-septal segment. This measures at least 2.5 cm. There is LVOT obstruction at rest consistent with HOCM. Peak gradient across the LVOT is 57 mmHg. Valsalva was not performed. Left ventricular diastolic parameters are consistent with Grade I diastolic dysfunction (impaired  relaxation). Elevated left ventricular end-diastolic pressure.  2. Right ventricular systolic function is normal. The right ventricular size is normal.  3. Systolic anterior motion (SAM) of the anterior leaflet is noted. The mitral valve is abnormal. Trivial mitral valve regurgitation.  4. The aortic valve is tricuspid. Aortic valve regurgitation is trivial. Mild to moderate aortic valve sclerosis/calcification is present, without any evidence of aortic stenosis.  5. Aortic dilatation noted. There is mild dilatation of the ascending aorta, measuring 41 mm. Conclusion(s)/Recommendation(s): Findings consistent with hypertrophic obstructive cardiomyopathy. FINDINGS  Left Ventricle: Left ventricular ejection fraction, by estimation, is 70 to 75%. The left ventricle has hyperdynamic function. The left ventricle has no regional wall motion abnormalities. The left ventricular internal cavity size was small. There is severe left ventricular hypertrophy of the basal-septal segment. Left ventricular diastolic parameters are consistent with Grade I diastolic dysfunction (impaired relaxation). Elevated left ventricular end-diastolic pressure. Right Ventricle: The right ventricular size is normal. No increase in right ventricular wall thickness. Right ventricular systolic function is normal. Left Atrium: Left atrial size was normal in size. Right Atrium: Right atrial size was normal in size. Pericardium: There is no evidence of pericardial effusion. Mitral Valve: Systolic anterior motion (SAM) of the anterior leaflet is  noted. The mitral valve is abnormal. There is mild thickening of the mitral valve leaflet(s). Trivial mitral valve regurgitation. Tricuspid Valve: The tricuspid valve is grossly normal. Tricuspid valve regurgitation is trivial. Aortic Valve: The aortic valve is tricuspid. Aortic valve regurgitation is trivial. Mild to moderate aortic valve sclerosis/calcification is  present, without any evidence of aortic stenosis. Pulmonic Valve: The pulmonic valve was normal in structure. Pulmonic valve regurgitation is not visualized. Aorta: Aortic dilatation noted. There is mild dilatation of the ascending aorta, measuring 41 mm. IAS/Shunts: No atrial level shunt detected by color flow Doppler.  LEFT VENTRICLE PLAX 2D LVIDd:         2.40 cm  Diastology LVIDs:         2.40 cm  LV e' medial:    5.44 cm/s LV PW:         1.30 cm  LV E/e' medial:  11.8 LV IVS:        2.50 cm  LV e' lateral:   6.53 cm/s LVOT diam:     2.10 cm  LV E/e' lateral: 9.8 LVOT Area:     3.46 cm  RIGHT VENTRICLE RV S prime:     14.70 cm/s TAPSE (M-mode): 2.4 cm LEFT ATRIUM           Index LA diam:      3.70 cm 1.57 cm/m LA Vol (A2C): 52.9 ml 22.44 ml/m   AORTA Ao Root diam: 3.80 cm MITRAL VALVE MV Area (PHT): 4.49 cm    SHUNTS MV Decel Time: 169 msec    Systemic Diam: 2.10 cm MV E velocity: 64.00 cm/s MV A velocity: 54.20 cm/s MV E/A ratio:  1.18 Lyman Bishop MD Electronically signed by Lyman Bishop MD Signature Date/Time: 03/02/2020/5:41:55 PM    Final    Disposition   Pt is being discharged home today in good condition.  Follow-up Plans & Appointments     Follow-up Information    O'Neal, Cassie Freer, MD. Go on 03/31/2020.   Specialties: Internal Medicine, Cardiology, Radiology Why: @9 :20am for hospital follow up  Contact information: [place] [address] [phone]              Discharge Instructions    Diet - low sodium heart healthy   Complete by: As directed    Increase activity slowly   Complete by:  As directed       Discharge Medications   Allergies as of 03/03/2020   No Known Allergies     Medication List    STOP taking these medications   atorvastatin 20 MG tablet Commonly known as: LIPITOR   lisinopril-hydrochlorothiazide 20-12.5 MG tablet Commonly known as: ZESTORETIC   meclizine 25 MG tablet Commonly known as: ANTIVERT     TAKE these medications   apixaban 5 MG Tabs tablet Commonly known as: ELIQUIS Take 1 tablet (5 mg total) by mouth 2 (two) times daily.   diltiazem 240 MG 24 hr capsule Commonly known as: CARDIZEM CD Take 1 capsule (240 mg total) by mouth daily.   FISH OIL PO Take 1 capsule by mouth daily.   leuprolide 30 MG injection Commonly known as: LUPRON Inject 60 mg into the muscle every 6 (six) months.   multivitamin with minerals Tabs tablet Take 1 tablet by mouth daily.   VITAMIN B 12 PO Take 1 tablet by mouth daily.   VITAMIN C PO Take 1 tablet by mouth daily.          Outstanding Labs/Studies   Pending Zio  Duration of Discharge Encounter   Greater than 30 minutes including physician time.  Jarrett Soho, PA 03/03/2020, 12:52 PM

## 2020-03-04 DIAGNOSIS — I4891 Unspecified atrial fibrillation: Secondary | ICD-10-CM | POA: Diagnosis not present

## 2020-03-05 ENCOUNTER — Telehealth: Payer: Self-pay

## 2020-03-05 ENCOUNTER — Telehealth: Payer: Self-pay | Admitting: Cardiovascular Disease

## 2020-03-05 NOTE — Telephone Encounter (Signed)
-----   Message from Geralynn Rile, MD sent at 03/03/2020  6:02 PM EDT ----- Regarding: Cancel his Afib clinic appointment Almyra Free:  I discharged this patient. He does not need to go to the Afib clinic. Please cancel and call him and tell him he doesn't need this.   [place] T. Audie Box, [place]  [address], [place], [place] [address] [phone]  6:03 PM

## 2020-03-05 NOTE — Telephone Encounter (Signed)
Hey, can you take a look at the MRI and give your recommendations.   Thanks!

## 2020-03-05 NOTE — Telephone Encounter (Signed)
Patient is requesting to discuss results from MRI completed on 03/03/20. Please call.

## 2020-03-05 NOTE — Telephone Encounter (Signed)
Patient was called, LVM advising that this appointment was not needed and I was going to cancel it.  Left call back number for questions.

## 2020-03-08 ENCOUNTER — Ambulatory Visit (HOSPITAL_COMMUNITY): Payer: Medicare HMO | Admitting: Nurse Practitioner

## 2020-03-08 NOTE — Telephone Encounter (Signed)
Called Mr. Staat. CMR confirmed diagnosis of hypertrophic cardiomyopathy. Still getting fatigued but not rapid heart rates. Will continue diltiazem and eliquis for now. 2% LGE on CMR. Plans to see me back to consider genetic testing and to likely set up for echo stress.   [place] T. Audie Box, [place]  [address], [place], [place] [address] [phone]  10:34 AM

## 2020-03-16 ENCOUNTER — Telehealth: Payer: Self-pay | Admitting: Physician Assistant

## 2020-03-16 NOTE — Telephone Encounter (Signed)
° ° °  [name] from [employer] calling, she wanted to ask David Smith if he will withdraw the procedure to be in alignment with clinical guideline. She said when [facility] or nurse called dial [phone] and asked for a clinical nurse reviewer in cardiac telemetry monitor. She said it will expire in 48 hours.

## 2020-03-16 NOTE — Telephone Encounter (Signed)
Dr. Audie Box is primary card.

## 2020-03-17 ENCOUNTER — Other Ambulatory Visit: Payer: Self-pay | Admitting: Urology

## 2020-03-17 ENCOUNTER — Telehealth: Payer: Self-pay | Admitting: Cardiovascular Disease

## 2020-03-17 DIAGNOSIS — Z13820 Encounter for screening for osteoporosis: Secondary | ICD-10-CM

## 2020-03-17 DIAGNOSIS — C61 Malignant neoplasm of prostate: Secondary | ICD-10-CM

## 2020-03-17 NOTE — Telephone Encounter (Signed)
Melanie from [employer] is calling to see what type of zio patch the patient had. She wants to know if it is an AT or XT. Please advise  Case number: [id number]

## 2020-03-17 NOTE — Telephone Encounter (Signed)
Spoke with David Smith and she is asking if the Elwyn Reach is "live monitoring"... I see that "live" was ordered but it may have been ordered in error or does the pt need the regular Zio monitoring with the report compiled at the end of the monitoring time..  Will need to forward to [facility] PA... not noted on the D/C summary.

## 2020-03-17 NOTE — Telephone Encounter (Signed)
I do not understand the request. I have cc'ed my nurse on this too.   [place] T. Audie Box, [place]  [address] [place], [place] [address] [phone]  9:20 AM

## 2020-03-17 NOTE — Telephone Encounter (Signed)
Attempted to call David Smith with [employer] but I tried multiple extension options and under the Cardiac option the phone rang and there was no answer. I called twice.    I am unsure what this message is about... sounds like it may be billing related.   Will forward to them to see if they are familiar with this message/ caller. No extension was left to reach her back.

## 2020-03-17 NOTE — Telephone Encounter (Signed)
This is for the Monitor order. They need to talk to [place] about the necessity of this monitor. Vin ordered the monitor that is why he needs to talk to them. I guess that he needs to do a physician to physician call to discuss. Christena Deem, MD @ [phone]. Tracking number [id number]

## 2020-03-18 NOTE — Telephone Encounter (Signed)
It does not need to be live.   [place] T. Audie Box, [place]  [address], [place], [place] [address] [phone]  8:48 AM

## 2020-03-19 NOTE — Telephone Encounter (Addendum)
Spoke with David Smith at [place] a part of the pts [employer] and the request for added information re: his Zio patch order expired yesterday it now went to his individual plan... they will be contacting us re: medical necessity.. I advised her that we do not need a "Live" Zio but she said we just have to wait to be recontacted and we can give that information then.

## 2020-03-22 NOTE — Telephone Encounter (Signed)
ZIO AT Long term monitor-Live telemetry was ordered by Gilberto Better for I48.91 A-Fib. This monitor [id number] was enrolled with the [facility] company and  applied in the hospital by Lynder Parents, CCT on 03/03/2020,  at the time of discharge.   This monitor has been completed and is in transit to [employer] to be processed.  The order cannot be changed after the service has been provided.

## 2020-03-30 NOTE — Progress Notes (Signed)
Cardiology Office Note:   Date:  03/31/2020  NAME:  David Smith    MRN: [id number] DOB:  22-Oct-1955   PCP:  Colon Branch, MD  Cardiologist:  Evalina Field, MD    Referring MD: Colon Branch, MD   Chief Complaint  Patient presents with  . Cardiomyopathy   History of Present Illness:   David Smith is a 64 y.o. male with a hx of HOCM, Afib, HTN, dilated aorta who presents for follow-up. Admitted 02/2020 for new onset Afib with RVR. Found to have HOCM. Zio without afib recurrence.  He reports he is doing fairly well.  He is getting short of breath with exertion.  He reports he tried to play tennis the other day and had to sit down several times.  He also describes low energy.  He reports working on hot days in the yard also is problematic.  He is on his diltiazem and Eliquis.  No missed doses.  He does not have a murmur today.  He has no provocative murmur today.  His heart rate is in the 60s.  He is in normal rhythm.  We did go over the results of his testing.  No risk factor for sudden cardiac death.  ICD is not recommended.  He will remain on Eliquis indefinitely.  I have recommended a stress echocardiogram to see if he is having outflow tract obstruction when he exerts himself.  He seems to be doing quite stable by examination today as he has no murmur.  We did discuss genetic testing.  He would like to think about this.  This has no bearing on him but will have bearing on his offspring.  At the minimum I recommended all of his children be evaluated by cardiologist including first-degree relatives.  Problem List 1. HOCM -23 mm septum -reverse curvature  -LGE 2% -negative monitor for NSVT -no syncope -no family history of SCD -LVOTO 57 mmHG 2. Atrial fibrillation, paroxysmal 3. HTN 4. Dilated ascending aorta` -44 mm 02/2020  Past Medical History: Past Medical History:  Diagnosis Date  . Chronic lower back pain 06-11-12   daily a problem Pain level 7, Tylenol helpful(down to  level 2)  . H/O cardiovascular stress test    non ischemic EF 55% 05-15-2007  . Heart murmur    dx as a teen, Stress ECHO 2008 (-)   . Hypertension   . Prostate cancer metastatic to intrapelvic lymph node (HCC)    gleason 4+3=7, volume 56.4 cc, PSA 34.25    Past Surgical History: Past Surgical History:  Procedure Laterality Date  . ACHILLES TENDON REPAIR     26 yrs ago, right foot  . ANAL FISSURECTOMY  2008  . dental procedure  06-11-12   minor -office procedure to correct jaw issue-used anesthesia gas to do  . LYMPHADENECTOMY  06/17/2012   Procedure: LYMPHADENECTOMY;  Surgeon: [name], MD;  Location: [facility];  Service: Urology;  Laterality: Bilateral;  . PROSTATE BIOPSY    . ROBOT ASSISTED LAPAROSCOPIC RADICAL PROSTATECTOMY  06/17/2012   Procedure: ROBOTIC ASSISTED LAPAROSCOPIC RADICAL PROSTATECTOMY LEVEL 2;  Surgeon: [name], MD;  Location: [facility];  Service: Urology;  Laterality: N/A;       Current Medications: Current Meds  Medication Sig  . apixaban (ELIQUIS) 5 MG TABS tablet Take 1 tablet (5 mg total) by mouth 2 (two) times daily.  . Ascorbic Acid (VITAMIN C PO) Take 1 tablet by mouth daily.  . Cyanocobalamin (VITAMIN B  12 PO) Take 1 tablet by mouth daily.  Marland Kitchen diltiazem (CARDIZEM CD) 240 MG 24 hr capsule Take 1 capsule (240 mg total) by mouth daily.  Marland Kitchen leuprolide (LUPRON) 30 MG injection Inject 60 mg into the muscle every 6 (six) months.   . Multiple Vitamin (MULTIVITAMIN WITH MINERALS) TABS tablet Take 1 tablet by mouth daily.  . Omega-3 Fatty Acids (FISH OIL PO) Take 1 capsule by mouth daily.  . [DISCONTINUED] apixaban (ELIQUIS) 5 MG TABS tablet Take 1 tablet (5 mg total) by mouth 2 (two) times daily.  . [DISCONTINUED] diltiazem (CARDIZEM CD) 240 MG 24 hr capsule Take 1 capsule (240 mg total) by mouth daily.     Allergies:    Patient has no known allergies.   Social History: Social History   Socioeconomic History  . Marital status: Married    Spouse name: Not on  file  . Number of children: 3  . Years of education: Not on file  . Highest education level: Not on file  Occupational History  . Occupation: disability d/t back pain-not working at present  Tobacco Use  . Smoking status: Never Smoker  . Smokeless tobacco: Never Used  Substance and Sexual Activity  . Alcohol use: Yes    Alcohol/week: 0.0 standard drinks    Comment: rare   . Drug use: No  . Sexual activity: Yes  Other Topics Concern  . Not on file  Social History Narrative   Household: pt, wife 61 y/o child    Social Determinants of Radio broadcast assistant Strain:   . Difficulty of Paying Living Expenses: Not on file  Food Insecurity:   . Worried About Charity fundraiser in the Last Year: Not on file  . Ran Out of Food in the Last Year: Not on file  Transportation Needs:   . Lack of Transportation (Medical): Not on file  . Lack of Transportation (Non-Medical): Not on file  Physical Activity:   . Days of Exercise per Week: Not on file  . Minutes of Exercise per Session: Not on file  Stress:   . Feeling of Stress : Not on file  Social Connections:   . Frequency of Communication with Friends and Family: Not on file  . Frequency of Social Gatherings with Friends and Family: Not on file  . Attends Religious Services: Not on file  . Active Member of Clubs or Organizations: Not on file  . Attends Archivist Meetings: Not on file  . Marital Status: Not on file     Family History: The patient's family history includes Cancer in his mother; Cancer (age of onset: 8) in his brother; Cancer (age of onset: 2) in his father; Heart disease in his mother. There is no history of Colon cancer or Colon polyps.  ROS:   All other ROS reviewed and negative. Pertinent positives noted in the HPI.     EKGs/Labs/Other Studies Reviewed:   The following studies were personally reviewed by me today:  Echo 03/02/2020 1. Left ventricular ejection fraction, by estimation, is 70  to 75%. The  left ventricle has hyperdynamic function. The left ventricle has no  regional wall motion abnormalities. There is severe left ventricular  hypertrophy of the basal-septal segment.  This measures at least 2.5 cm. There is LVOT obstruction at rest  consistent with HOCM. Peak gradient across the LVOT is 57 mmHg. Valsalva  was not performed. Left ventricular diastolic parameters are consistent  with Grade I diastolic dysfunction (impaired  relaxation). Elevated left ventricular end-diastolic pressure.  2. Right ventricular systolic function is normal. The right ventricular  size is normal.  3. Systolic anterior motion (SAM) of the anterior leaflet is noted. The  mitral valve is abnormal. Trivial mitral valve regurgitation.  4. The aortic valve is tricuspid. Aortic valve regurgitation is trivial.  Mild to moderate aortic valve sclerosis/calcification is present, without  any evidence of aortic stenosis.  5. Aortic dilatation noted. There is mild dilatation of the ascending  aorta, measuring 41 mm.   Zio 03/03/2020 Impression: 1. Brief atrial tachycardia episodes detected (longest interval 9 beats; 4 seconds).  2. No atrial fibrillation.  3. No ventricular tachycardia.  4. Rare ectopy.   CMR 03/03/2020 IMPRESSION: 1. Asymmetric LV hypertrophy measuring up to 22mm in basal septum (70mm in posterior wall), consistent with hypertrophic cardiomyopathy  2. RV insertion site late gadolinium enhancement, consistent with HCM. LGE accounts for 2% of total myocardial mass  3.  Normal LV size with hyperdynamic systolic function (EF 72%)  4.  Normal RV size and systolic function (EF 09%)  5.  Dilated ascending aorta measuring 41mm  Recent Labs: 03/02/2020: ALT 21; B Natriuretic Peptide 193.4; Hemoglobin 13.1; Magnesium 2.3; Platelets 224; TSH 1.538 03/03/2020: BUN 18; Creatinine, Ser 1.32; Potassium 4.1; Sodium 138   Recent Lipid Panel    Component Value Date/Time   CHOL  193 03/03/2020 0539   TRIG 59 03/03/2020 0539   HDL 44 03/03/2020 0539   CHOLHDL 4.4 03/03/2020 0539   VLDL 12 03/03/2020 0539   LDLCALC 137 (H) 03/03/2020 0539   LDLDIRECT 162.4 04/12/2007 0000    Physical Exam:   VS:  BP 140/80 (BP Location: Left Arm, Patient Position: Sitting, Cuff Size: Large)   Pulse 63   Ht 6\' 1"  (1.854 m)   Wt 261 lb (118.4 kg)   BMI 34.43 kg/m    Wt Readings from Last 3 Encounters:  03/31/20 261 lb (118.4 kg)  03/02/20 248 lb (112.5 kg)  05/07/19 244 lb (110.7 kg)    General: Well nourished, well developed, in no acute distress Heart: Atraumatic, normal size  Eyes: PEERLA, EOMI  Neck: Supple, no JVD Endocrine: No thryomegaly Cardiac: Normal S1, S2; RRR; no murmurs, rubs, or gallops Lungs: Clear to auscultation bilaterally, no wheezing, rhonchi or rales  Abd: Soft, nontender, no hepatomegaly  Ext: No edema, pulses 2+ Musculoskeletal: No deformities, BUE and BLE strength normal and equal Skin: Warm and dry, no rashes   Neuro: Alert and oriented to person, place, time, and situation, CNII-XII grossly intact, no focal deficits  Psych: Normal mood and affect   ASSESSMENT:   JOEL COWIN is a 64 y.o. male who presents for the following: 1. HOCM (hypertrophic obstructive cardiomyopathy) (HCC)   2. Paroxysmal atrial fibrillation ([place])   3. Primary hypertension   4. Ascending aorta dilatation (HCC)   5. Mixed hyperlipidemia     PLAN:   1. HOCM (hypertrophic obstructive cardiomyopathy) ([place]) -He appears to have hypertrophic obstructive cardiomyopathy, reverse curvature.  MRI negative for increase CAD.  No evidence of ventricular tachycardia, sustained or nonsustained on recent monitor.  No syncope reported.  There is no family history of sudden cardiac death.  He really has no risk factors for sudden cardiac death and ICD is not recommended. -He has no murmur on exam today.  Heart rate in the 60s.  He is on diltiazem 240 daily.  He is short of breath  with exertion.  I like to proceed with  exercise stress echocardiogram to assess for outflow tract obstruction.  He has no provocative outflow tract obstruction. -We may need to further titrate therapy based on this.  2. Paroxysmal atrial fibrillation (HCC) -We will continue anticoagulation.  Anticoagulation is recommended in the setting of hypertrophic cardiomyopathy independent of chads vas score. -No further recurrence of atrial fibrillation on monitor.  I have recommended the cardia mobile device for continued monitoring of his rhythm.  3. Primary hypertension -Stable today.  4. Ascending aorta dilatation (HCC) -Ascending aorta up to 44 mm.  Repeat in 1 year.  Tricuspid aortic valve.  No history of aortopathy in him or family.  5. Mixed hyperlipidemia -Most recent lipid profile shows LDL 137.  I recommended Crestor 20 mg daily.1  Disposition: Return in about 3 months (around 07/01/2020).  Medication Adjustments/Labs and Tests Ordered: Current medicines are reviewed at length with the patient today.  Concerns regarding medicines are outlined above.  Orders Placed This Encounter  Procedures  . ECHOCARDIOGRAM STRESS TEST   Meds ordered this encounter  Medications  . rosuvastatin (CRESTOR) 20 MG tablet    Sig: Take 1 tablet (20 mg total) by mouth daily.    Dispense:  90 tablet    Refill:  3  . apixaban (ELIQUIS) 5 MG TABS tablet    Sig: Take 1 tablet (5 mg total) by mouth 2 (two) times daily.    Dispense:  180 tablet    Refill:  3  . diltiazem (CARDIZEM CD) 240 MG 24 hr capsule    Sig: Take 1 capsule (240 mg total) by mouth daily.    Dispense:  90 capsule    Refill:  3    Patient Instructions  Medication Instructions:  Start Crestor 20 mg daily  Continue all other medications *If you need a refill on your cardiac medications before your next appointment, please call your pharmacy*   Lab Work: None ordered   Testing/Procedures: Stress Echo   Follow-Up: At [facility], you and your health needs are our priority.  As part of our continuing mission to provide you with exceptional heart care, we have created designated Provider Care Teams.  These Care Teams include your primary Cardiologist (physician) and Advanced Practice Providers (APPs -  Physician Assistants and Nurse Practitioners) who all work together to provide you with the care you need, when you need it.  We recommend signing up for the patient portal called "MyChart".  Sign up information is provided on this After Visit Summary.  MyChart is used to connect with patients for Virtual Visits (Telemedicine).  Patients are able to view lab/test results, encounter notes, upcoming appointments, etc.  Non-urgent messages can be sent to your provider as well.   To learn more about what you can do with MyChart, go to [email or web address].    Your next appointment:  Friday 07/02/20 at 9:40 am   The format for your next appointment:     Provider:  Dr.O'Neal      Time Spent with Patient: I have spent a total of 35 minutes with patient reviewing hospital notes, telemetry, EKGs, labs and examining the patient as well as establishing an assessment and plan that was discussed with the patient.  > 50% of time was spent in direct patient care.  Signed, Addison Naegeli. Audie Box, [place]  [address] [place], [place] [address] [phone]  03/31/2020 10:40 AM

## 2020-03-31 ENCOUNTER — Other Ambulatory Visit: Payer: Self-pay

## 2020-03-31 ENCOUNTER — Ambulatory Visit: Payer: Medicare HMO | Admitting: Cardiovascular Disease

## 2020-03-31 ENCOUNTER — Encounter: Payer: Self-pay | Admitting: Cardiovascular Disease

## 2020-03-31 VITALS — BP 140/80 | HR 63 | Ht 73.0 in | Wt 261.0 lb

## 2020-03-31 DIAGNOSIS — E782 Mixed hyperlipidemia: Secondary | ICD-10-CM

## 2020-03-31 DIAGNOSIS — I480 Paroxysmal atrial fibrillation: Secondary | ICD-10-CM

## 2020-03-31 DIAGNOSIS — I421 Obstructive hypertrophic cardiomyopathy: Secondary | ICD-10-CM | POA: Diagnosis not present

## 2020-03-31 DIAGNOSIS — I10 Essential (primary) hypertension: Secondary | ICD-10-CM | POA: Diagnosis not present

## 2020-03-31 DIAGNOSIS — I77810 Thoracic aortic ectasia: Secondary | ICD-10-CM

## 2020-03-31 MED ORDER — ROSUVASTATIN CALCIUM 20 MG PO TABS: 20.0000 mg | ORAL_TABLET | Freq: Every day | ORAL | 3 refills | Status: DC

## 2020-03-31 MED ORDER — DILTIAZEM HCL ER COATED BEADS 240 MG PO CP24
240.0000 mg | ORAL_CAPSULE | Freq: Every day | ORAL | 3 refills | Status: DC
Start: 2020-03-31 — End: 2021-04-07

## 2020-03-31 MED ORDER — APIXABAN 5 MG PO TABS
5.0000 mg | ORAL_TABLET | Freq: Two times a day (BID) | ORAL | 3 refills | Status: DC
Start: 2020-03-31 — End: 2021-04-07

## 2020-03-31 NOTE — Patient Instructions (Addendum)
Medication Instructions:  Start Crestor 20 mg daily  Continue all other medications *If you need a refill on your cardiac medications before your next appointment, please call your pharmacy*   Lab Work: None ordered   Testing/Procedures: Stress Echo   Follow-Up: At [facility], you and your health needs are our priority.  As part of our continuing mission to provide you with exceptional heart care, we have created designated Provider Care Teams.  These Care Teams include your primary Cardiologist (physician) and Advanced Practice Providers (APPs -  Physician Assistants and Nurse Practitioners) who all work together to provide you with the care you need, when you need it.  We recommend signing up for the patient portal called "MyChart".  Sign up information is provided on this After Visit Summary.  MyChart is used to connect with patients for Virtual Visits (Telemedicine).  Patients are able to view lab/test results, encounter notes, upcoming appointments, etc.  Non-urgent messages can be sent to your provider as well.   To learn more about what you can do with MyChart, go to [email or web address].    Your next appointment:  Friday 07/02/20 at 9:40 am   The format for your next appointment:     Provider:  Dr.O'Neal

## 2020-04-01 DIAGNOSIS — R040 Epistaxis: Secondary | ICD-10-CM | POA: Diagnosis not present

## 2020-04-01 DIAGNOSIS — J342 Deviated nasal septum: Secondary | ICD-10-CM | POA: Diagnosis not present

## 2020-04-01 DIAGNOSIS — J343 Hypertrophy of nasal turbinates: Secondary | ICD-10-CM | POA: Diagnosis not present

## 2020-04-01 DIAGNOSIS — Z7901 Long term (current) use of anticoagulants: Secondary | ICD-10-CM | POA: Diagnosis not present

## 2020-04-01 DIAGNOSIS — I4891 Unspecified atrial fibrillation: Secondary | ICD-10-CM | POA: Diagnosis not present

## 2020-05-24 ENCOUNTER — Other Ambulatory Visit: Payer: Self-pay

## 2020-05-24 DIAGNOSIS — I421 Obstructive hypertrophic cardiomyopathy: Secondary | ICD-10-CM

## 2020-05-24 NOTE — Progress Notes (Signed)
Consent signed.   David Smith, [place]  [address], [place], [place] [address] [phone]  1:44 PM

## 2020-05-25 ENCOUNTER — Telehealth (HOSPITAL_COMMUNITY): Payer: Self-pay | Admitting: *Deleted

## 2020-05-25 NOTE — Telephone Encounter (Signed)
Patient given detailed instructions per Stress Test Requisition Sheet for test on 06/02/20 at 1400.Patient Notified to arrive 30 minutes early, and that it is imperative to arrive on time for appointment to keep from having the test rescheduled.  Patient verbalized understanding. Eddi Hymes, [place]

## 2020-05-31 ENCOUNTER — Other Ambulatory Visit (HOSPITAL_COMMUNITY)
Admission: RE | Admit: 2020-05-31 | Discharge: 2020-05-31 | Disposition: A | Discharge status: Home / Self Care | Payer: Medicare HMO | Source: Ambulatory Visit | Attending: Cardiovascular Disease | Admitting: Cardiovascular Disease

## 2020-05-31 DIAGNOSIS — Z20822 Contact with and (suspected) exposure to covid-19: Secondary | ICD-10-CM | POA: Diagnosis not present

## 2020-05-31 DIAGNOSIS — Z01812 Encounter for preprocedural laboratory examination: Secondary | ICD-10-CM | POA: Diagnosis not present

## 2020-06-01 LAB — SARS CORONAVIRUS 2 (TAT 6-24 HRS): SARS Coronavirus 2: NEGATIVE

## 2020-06-02 ENCOUNTER — Other Ambulatory Visit: Payer: Self-pay

## 2020-06-02 ENCOUNTER — Ambulatory Visit (HOSPITAL_COMMUNITY): Payer: Medicare HMO | Attending: Internal Medicine

## 2020-06-02 ENCOUNTER — Ambulatory Visit (HOSPITAL_COMMUNITY): Payer: Medicare HMO

## 2020-06-02 DIAGNOSIS — I77810 Thoracic aortic ectasia: Secondary | ICD-10-CM | POA: Insufficient documentation

## 2020-06-02 DIAGNOSIS — I480 Paroxysmal atrial fibrillation: Secondary | ICD-10-CM

## 2020-06-02 DIAGNOSIS — I421 Obstructive hypertrophic cardiomyopathy: Secondary | ICD-10-CM | POA: Diagnosis not present

## 2020-06-02 DIAGNOSIS — E782 Mixed hyperlipidemia: Secondary | ICD-10-CM | POA: Diagnosis not present

## 2020-06-02 DIAGNOSIS — I10 Essential (primary) hypertension: Secondary | ICD-10-CM

## 2020-06-03 ENCOUNTER — Other Ambulatory Visit (HOSPITAL_COMMUNITY): Payer: Self-pay | Admitting: Cardiovascular Disease

## 2020-06-03 DIAGNOSIS — I421 Obstructive hypertrophic cardiomyopathy: Secondary | ICD-10-CM

## 2020-06-08 ENCOUNTER — Ambulatory Visit (HOSPITAL_COMMUNITY): Payer: Medicare HMO | Attending: Cardiology

## 2020-06-08 ENCOUNTER — Other Ambulatory Visit: Payer: Self-pay

## 2020-06-08 ENCOUNTER — Ambulatory Visit (HOSPITAL_COMMUNITY): Payer: Medicare HMO

## 2020-06-08 DIAGNOSIS — I421 Obstructive hypertrophic cardiomyopathy: Secondary | ICD-10-CM

## 2020-06-08 LAB — ECHOCARDIOGRAM STRESS TEST: S' Lateral: 3 cm

## 2020-07-01 NOTE — Progress Notes (Signed)
Cardiology Office Note:   Date:  07/02/2020  NAME:  David Smith    MRN: [id number] DOB:  1955-10-21   PCP:  Colon Branch, MD  Cardiologist:  Evalina Field, MD   Referring MD: Colon Branch, MD   Chief Complaint  Patient presents with  . Atrial Fibrillation   History of Present Illness:   David Smith is a 65 y.o. male with a hx of HOCM, pAF, HTN, dilated ascending aorta who presents for follow-up. Was complaining of SOB with exertion. Stress test with gradient ~30 mmHG at peak exercise.  He reports he is doing well since her last visit.  He is doing activities such as playing tennis without any major shortness of breath.  He still can get winded but he feels he is deconditioned.  He denies any chest pain or rapid heartbeat sensation.  He did purchase the cardia mobile device.  No further recurrence of atrial fibrillation per his report.  BP 131/73.  Heart rate 69.  BMI still elevated at 35.  He is can work on losing some weight.  We started him on Crestor at her last visit.  He is fasting today and we will have repeat labs.  He does report he was diagnosed with prostate cancer 10 years ago.  He underwent a prostatectomy and radiation therapy.  He has been on and off antiandrogen therapy.  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  He reports he is prone to bleeding with certain foods.  He has concerns about being on Eliquis as he is noticed some hematuria.  No major or overt bleeding.  Still on Eliquis.  We did discuss the watchman procedure.  He is interested.  He apparently is having some issues with his prostate needs to be evaluated by his urologist.  Need to determine if his prostate cancer is back.  He will undergo this evaluation and think about the watchman procedure.  I think he would be an excellent candidate for this.  Problem List 1. HOCM -23 mm septum (sigmoid septum variant) -LGE 2% -negative monitor for NSVT -no syncope -no family history of  SCD -LVOTO 57 mmHG -> 10 mmHG with treatment -ETT 06/08/2020 -> Normal BP response to exercise  2. Atrial fibrillation, paroxysmal 3. HTN 4. Dilated ascending aorta -44 mm on CMR 02/2020 5. Prostate CA -s/p prostatectomy/radiation -on anti-androgen therapy  -c/b radiation colitis/bladder damage  Past Medical History: Past Medical History:  Diagnosis Date  . Chronic lower back pain 06-11-12   daily a problem Pain level 7, Tylenol helpful(down to level 2)  . H/O cardiovascular stress test    non ischemic EF 55% 05-15-2007  . Heart murmur    dx as a teen, Stress ECHO 2008 (-)   . Hypertension   . Prostate cancer metastatic to intrapelvic lymph node (HCC)    gleason 4+3=7, volume 56.4 cc, PSA 34.25    Past Surgical History: Past Surgical History:  Procedure Laterality Date  . ACHILLES TENDON REPAIR     26 yrs ago, right foot  . ANAL FISSURECTOMY  2008  . dental procedure  06-11-12   minor -office procedure to correct jaw issue-used anesthesia gas to do  . LYMPHADENECTOMY  06/17/2012   Procedure: LYMPHADENECTOMY;  Surgeon: [name], MD;  Location: [facility];  Service: Urology;  Laterality: Bilateral;  . PROSTATE BIOPSY    . ROBOT ASSISTED LAPAROSCOPIC RADICAL PROSTATECTOMY  06/17/2012   Procedure: ROBOTIC ASSISTED LAPAROSCOPIC RADICAL PROSTATECTOMY  LEVEL 2;  Surgeon: [name], MD;  Location: [facility];  Service: Urology;  Laterality: N/A;       Current Medications: Current Meds  Medication Sig  . apixaban (ELIQUIS) 5 MG TABS tablet Take 1 tablet (5 mg total) by mouth 2 (two) times daily.  . Ascorbic Acid (VITAMIN C PO) Take 1 tablet by mouth daily.  . Cyanocobalamin (VITAMIN B 12 PO) Take 1 tablet by mouth daily.  Marland Kitchen diltiazem (CARDIZEM CD) 240 MG 24 hr capsule Take 1 capsule (240 mg total) by mouth daily.  Marland Kitchen leuprolide (LUPRON) 30 MG injection Inject 60 mg into the muscle every 6 (six) months.   . Multiple Vitamin (MULTIVITAMIN WITH MINERALS) TABS tablet Take 1 tablet by mouth  daily.  . Omega-3 Fatty Acids (FISH OIL PO) Take 1 capsule by mouth daily.     Allergies:    Patient has no known allergies.   Social History: Social History   Socioeconomic History  . Marital status: Married    Spouse name: Not on file  . Number of children: 3  . Years of education: Not on file  . Highest education level: Not on file  Occupational History  . Occupation: disability d/t back pain-not working at present  Tobacco Use  . Smoking status: Never Smoker  . Smokeless tobacco: Never Used  Substance and Sexual Activity  . Alcohol use: Yes    Alcohol/week: 0.0 standard drinks    Comment: rare   . Drug use: No  . Sexual activity: Yes  Other Topics Concern  . Not on file  Social History Narrative   Household: pt, wife 33 y/o child    Social Determinants of Radio broadcast assistant Strain: Not on file  Food Insecurity: Not on file  Transportation Needs: Not on file  Physical Activity: Not on file  Stress: Not on file  Social Connections: Not on file     Family History: The patient's family history includes Cancer in his mother; Cancer (age of onset: 71) in his brother; Cancer (age of onset: 36) in his father; Heart disease in his mother. There is no history of Colon cancer or Colon polyps.  ROS:   All other ROS reviewed and negative. Pertinent positives noted in the HPI.     EKGs/Labs/Other Studies Reviewed:   The following studies were personally reviewed by me today:   TTE 03/02/2020 1. Left ventricular ejection fraction, by estimation, is 70 to 75%. The  left ventricle has hyperdynamic function. The left ventricle has no  regional wall motion abnormalities. There is severe left ventricular  hypertrophy of the basal-septal segment.  This measures at least 2.5 cm. There is LVOT obstruction at rest  consistent with HOCM. Peak gradient across the LVOT is 57 mmHg. Valsalva  was not performed. Left ventricular diastolic parameters are consistent  with  Grade I diastolic dysfunction (impaired  relaxation). Elevated left ventricular end-diastolic pressure.  2. Right ventricular systolic function is normal. The right ventricular  size is normal.  3. Systolic anterior motion (SAM) of the anterior leaflet is noted. The  mitral valve is abnormal. Trivial mitral valve regurgitation.  4. The aortic valve is tricuspid. Aortic valve regurgitation is trivial.  Mild to moderate aortic valve sclerosis/calcification is present, without  any evidence of aortic stenosis.  5. Aortic dilatation noted. There is mild dilatation of the ascending  aorta, measuring 41 mm.   [place] 03/03/2020 Impression: 1. Brief atrial tachycardia episodes detected (longest interval 9 beats; 4 seconds).  2. No atrial fibrillation.  3. No ventricular tachycardia.  4. Rare ectopy.   Stress Echo 06/08/2020 1. Baseline echo with normal LV function, mild AI, severe basal septal  hypertrophy, chordal SAM and baseline gradient 1.6 m/s and peak gradient  10 mmHg (with valsalva); with peak HR, peak velocity 2.9 m/s and peak  gradient 34 mmHg.  2. This is a negative stress echocardiogram for ischemia.  3. The findings of the study demonstrate that a stress-induced outflow  tract obstruction is present (peak LVOT gradient 34 mmHG).  4. This is an indeterminate risk study  CMR 03/03/2020  IMPRESSION: 1. Asymmetric LV hypertrophy measuring up to 72mm in basal septum (43mm in posterior wall), consistent with hypertrophic cardiomyopathy  2. RV insertion site late gadolinium enhancement, consistent with HCM. LGE accounts for 2% of total myocardial mass  3.  Normal LV size with hyperdynamic systolic function (EF XX123456)  4.  Normal RV size and systolic function (EF 123456)  5.  Dilated ascending aorta measuring 68mm  Recent Labs: 03/02/2020: ALT 21; B Natriuretic Peptide 193.4; Hemoglobin 13.1; Magnesium 2.3; Platelets 224; TSH 1.538 03/03/2020: BUN 18; Creatinine, Ser 1.32;  Potassium 4.1; Sodium 138   Recent Lipid Panel    Component Value Date/Time   CHOL 193 03/03/2020 0539   TRIG 59 03/03/2020 0539   HDL 44 03/03/2020 0539   CHOLHDL 4.4 03/03/2020 0539   VLDL 12 03/03/2020 0539   LDLCALC 137 (H) 03/03/2020 0539   LDLDIRECT 162.4 04/12/2007 0000    Physical Exam:   VS:  BP 131/73   Pulse 69   Ht 6\' 1"  (1.854 m)   Wt 264 lb 3.2 oz (119.8 kg)   SpO2 98%   BMI 34.86 kg/m    Wt Readings from Last 3 Encounters:  07/02/20 264 lb 3.2 oz (119.8 kg)  03/31/20 261 lb (118.4 kg)  03/02/20 248 lb (112.5 kg)    General: Well nourished, well developed, in no acute distress Head: Atraumatic, normal size  Eyes: PEERLA, EOMI  Neck: Supple, no JVD Endocrine: No thryomegaly Cardiac: Normal S1, S2; RRR; no murmurs, rubs, or gallops Lungs: Clear to auscultation bilaterally, no wheezing, rhonchi or rales  Abd: Soft, nontender, no hepatomegaly  Ext: No edema, pulses 2+ Musculoskeletal: No deformities, BUE and BLE strength normal and equal Skin: Warm and dry, no rashes   Neuro: Alert and oriented to person, place, time, and situation, CNII-XII grossly intact, no focal deficits  Psych: Normal mood and affect   ASSESSMENT:   David Smith is a 65 y.o. male who presents for the following: 1. HOCM (hypertrophic obstructive cardiomyopathy) (HCC)   2. Paroxysmal atrial fibrillation ([place])   3. Primary hypertension   4. Ascending aorta dilatation (HCC)   5. Mixed hyperlipidemia   6. Obesity (BMI 30-39.9)     PLAN:   1. HOCM (hypertrophic obstructive cardiomyopathy) (HCC) -Sigmoid variant hypertrophic cardiomyopathy.  Was diagnosed with significant gradient.  Underwent treatment.  No gradient at rest.  Only 30 mmHg gradient at stress on stress echocardiogram.  Seems to be doing well on treatment.  We will continue diltiazem 240 mg daily.  He needs to get active and lose weight.  Overall without symptoms from his hypertrophic retinopathy.  2. Paroxysmal atrial  fibrillation (HCC) -Hypertrophic dermopathy with atrial fibrillation.  Paroxysmal.  Maintaining sinus rhythm on diltiazem.  He is on Eliquis 5 mg twice daily.  He has a history of prostate cancer and radiation colitis.  He also has bleeding from  his bladder related to this.  We did discuss his candidacy for the watchman procedure.  This would be a way for him to be off long-term blood thinners.  He reports he will think about this.  He does need evaluation by his urologist to determine if his cancer is back.  We will need to keep this in mind as we move forward.  Clearly want to keep his bleeding risk in mind as well.  We will check a CBC today.  We will also check a CMP.  3. Primary hypertension -Well-controlled on diltiazem.  4. Ascending aorta dilatation (HCC) -Acing aorta 44 mm.  Repeat cross-sectional imaging next year.  5. Mixed hyperlipidemia -10-year ASCVD risk were 15.5% which is intermediate.  Started on statin at her last visit.  Started on Crestor 20 mg a day.  Fasting lipid profile today.  6. Obesity (BMI 30-39.9) -Weight loss and exercise encouraged.   Disposition: Return in about 6 months (around 12/30/2020).  Medication Adjustments/Labs and Tests Ordered: Current medicines are reviewed at length with the patient today.  Concerns regarding medicines are outlined above.  Orders Placed This Encounter  Procedures  . Lipid panel  . Comprehensive metabolic panel  . CBC   No orders of the defined types were placed in this encounter.   Patient Instructions  Medication Instructions:  The current medical regimen is effective;  continue present plan and medications.  *If you need a refill on your cardiac medications before your next appointment, please call your pharmacy*   Lab Work: LIPID, CMET, CBC today  If you have labs (blood work) drawn today and your tests are completely normal, you will receive your results only by: Marland Kitchen MyChart Message (if you have MyChart) OR . A  paper copy in the mail If you have any lab test that is abnormal or we need to change your treatment, we will call you to review the results.  Follow-Up: At [facility], you and your health needs are our priority.  As part of our continuing mission to provide you with exceptional heart care, we have created designated Provider Care Teams.  These Care Teams include your primary Cardiologist (physician) and Advanced Practice Providers (APPs -  Physician Assistants and Nurse Practitioners) who all work together to provide you with the care you need, when you need it.  We recommend signing up for the patient portal called "MyChart".  Sign up information is provided on this After Visit Summary.  MyChart is used to connect with patients for Virtual Visits (Telemedicine).  Patients are able to view lab/test results, encounter notes, upcoming appointments, etc.  Non-urgent messages can be sent to your provider as well.   To learn more about what you can do with MyChart, go to [email or web address].    Your next appointment:   6 month(s)  The format for your next appointment:   In Person  Provider:   Eleonore Chiquito, MD   Other Instructions Think about Watchman that was discussed with you.      Time Spent with Patient: I have spent a total of 35 minutes with patient reviewing hospital notes, telemetry, EKGs, labs and examining the patient as well as establishing an assessment and plan that was discussed with the patient.  > 50% of time was spent in direct patient care.  Signed, Addison Naegeli. Audie Box, [place]  [address], [place], [place] [address] [phone]  07/02/2020 10:32 AM

## 2020-07-02 ENCOUNTER — Encounter: Payer: Self-pay | Admitting: Cardiovascular Disease

## 2020-07-02 ENCOUNTER — Ambulatory Visit: Payer: Medicare HMO | Admitting: Cardiovascular Disease

## 2020-07-02 ENCOUNTER — Other Ambulatory Visit: Payer: Self-pay

## 2020-07-02 VITALS — BP 131/73 | HR 69 | Ht 73.0 in | Wt 264.2 lb

## 2020-07-02 DIAGNOSIS — E782 Mixed hyperlipidemia: Secondary | ICD-10-CM | POA: Diagnosis not present

## 2020-07-02 DIAGNOSIS — I421 Obstructive hypertrophic cardiomyopathy: Secondary | ICD-10-CM

## 2020-07-02 DIAGNOSIS — I10 Essential (primary) hypertension: Secondary | ICD-10-CM

## 2020-07-02 DIAGNOSIS — I480 Paroxysmal atrial fibrillation: Secondary | ICD-10-CM

## 2020-07-02 DIAGNOSIS — I77810 Thoracic aortic ectasia: Secondary | ICD-10-CM

## 2020-07-02 DIAGNOSIS — E669 Obesity, unspecified: Secondary | ICD-10-CM

## 2020-07-02 LAB — COMPREHENSIVE METABOLIC PANEL
ALT: 21 IU/L (ref 0–44)
AST: 19 IU/L (ref 0–40)
Albumin/Globulin Ratio: 1.6 (ref 1.2–2.2)
Albumin: 4.5 g/dL (ref 3.8–4.8)
Alkaline Phosphatase: 102 IU/L (ref 44–121)
BUN/Creatinine Ratio: 14 (ref 10–24)
BUN: 17 mg/dL (ref 8–27)
Bilirubin Total: 0.3 mg/dL (ref 0.0–1.2)
CO2: 25 mmol/L (ref 20–29)
Calcium: 9.2 mg/dL (ref 8.6–10.2)
Chloride: 107 mmol/L — ABNORMAL HIGH (ref 96–106)
Creatinine, Ser: 1.23 mg/dL (ref 0.76–1.27)
GFR calc Af Amer: 72 mL/min/{1.73_m2} (ref >59)
GFR calc non Af Amer: 63 mL/min/{1.73_m2} (ref >59)
Globulin, Total: 2.8 g/dL (ref 1.5–4.5)
Glucose: 86 mg/dL (ref 65–99)
Potassium: 4.4 mmol/L (ref 3.5–5.2)
Sodium: 143 mmol/L (ref 134–144)
Total Protein: 7.3 g/dL (ref 6.0–8.5)

## 2020-07-02 LAB — CBC
Hematocrit: 36.8 % — ABNORMAL LOW (ref 37.5–51.0)
Hemoglobin: 12.2 g/dL — ABNORMAL LOW (ref 13.0–17.7)
MCH: 26.2 pg — ABNORMAL LOW (ref 26.6–33.0)
MCHC: 33.2 g/dL (ref 31.5–35.7)
MCV: 79 fL (ref 79–97)
Platelets: 169 10*3/uL (ref 150–450)
RBC: 4.65 x10E6/uL (ref 4.14–5.80)
RDW: 13.6 % (ref 11.6–15.4)
WBC: 5.3 10*3/uL (ref 3.4–10.8)

## 2020-07-02 LAB — LIPID PANEL
Chol/HDL Ratio: 3 ratio (ref 0.0–5.0)
Cholesterol, Total: 124 mg/dL (ref 100–199)
HDL: 42 mg/dL (ref >39)
LDL Chol Calc (NIH): 70 mg/dL (ref 0–99)
Triglycerides: 56 mg/dL (ref 0–149)
VLDL Cholesterol Cal: 12 mg/dL (ref 5–40)

## 2020-07-02 NOTE — Patient Instructions (Signed)
Medication Instructions:  The current medical regimen is effective;  continue present plan and medications.  *If you need a refill on your cardiac medications before your next appointment, please call your pharmacy*   Lab Work: LIPID, CMET, CBC today  If you have labs (blood work) drawn today and your tests are completely normal, you will receive your results only by: Marland Kitchen MyChart Message (if you have MyChart) OR . A paper copy in the mail If you have any lab test that is abnormal or we need to change your treatment, we will call you to review the results.  Follow-Up: At [facility], you and your health needs are our priority.  As part of our continuing mission to provide you with exceptional heart care, we have created designated Provider Care Teams.  These Care Teams include your primary Cardiologist (physician) and Advanced Practice Providers (APPs -  Physician Assistants and Nurse Practitioners) who all work together to provide you with the care you need, when you need it.  We recommend signing up for the patient portal called "MyChart".  Sign up information is provided on this After Visit Summary.  MyChart is used to connect with patients for Virtual Visits (Telemedicine).  Patients are able to view lab/test results, encounter notes, upcoming appointments, etc.  Non-urgent messages can be sent to your provider as well.   To learn more about what you can do with MyChart, go to [email or web address].    Your next appointment:   6 month(s)  The format for your next appointment:   In Person  Provider:   Eleonore Chiquito, MD   Other Instructions Think about Watchman that was discussed with you.

## 2020-07-05 ENCOUNTER — Ambulatory Visit
Admission: RE | Admit: 2020-07-05 | Discharge: 2020-07-05 | Disposition: A | Discharge status: Home / Self Care | Payer: Medicare HMO | Source: Ambulatory Visit | Attending: Urology | Admitting: Urology

## 2020-07-05 ENCOUNTER — Other Ambulatory Visit: Payer: Self-pay

## 2020-07-05 DIAGNOSIS — C61 Malignant neoplasm of prostate: Secondary | ICD-10-CM

## 2020-07-05 DIAGNOSIS — Z13820 Encounter for screening for osteoporosis: Secondary | ICD-10-CM

## 2020-07-05 DIAGNOSIS — M8580 Other specified disorders of bone density and structure, unspecified site: Secondary | ICD-10-CM | POA: Diagnosis not present

## 2020-08-10 ENCOUNTER — Encounter (HOSPITAL_COMMUNITY): Payer: Self-pay

## 2020-08-10 ENCOUNTER — Emergency Department (HOSPITAL_COMMUNITY)
Admission: EM | Admit: 2020-08-10 | Discharge: 2020-08-10 | Disposition: A | Discharge status: Home / Self Care | Payer: Medicare HMO | Attending: Emergency Medicine | Admitting: Emergency Medicine

## 2020-08-10 ENCOUNTER — Other Ambulatory Visit: Payer: Self-pay

## 2020-08-10 ENCOUNTER — Emergency Department (HOSPITAL_COMMUNITY): Payer: Medicare HMO

## 2020-08-10 DIAGNOSIS — I10 Essential (primary) hypertension: Secondary | ICD-10-CM | POA: Diagnosis not present

## 2020-08-10 DIAGNOSIS — Z79899 Other long term (current) drug therapy: Secondary | ICD-10-CM | POA: Insufficient documentation

## 2020-08-10 DIAGNOSIS — Z7901 Long term (current) use of anticoagulants: Secondary | ICD-10-CM | POA: Diagnosis not present

## 2020-08-10 DIAGNOSIS — R42 Dizziness and giddiness: Secondary | ICD-10-CM | POA: Insufficient documentation

## 2020-08-10 DIAGNOSIS — Z8546 Personal history of malignant neoplasm of prostate: Secondary | ICD-10-CM | POA: Insufficient documentation

## 2020-08-10 LAB — CBC WITH DIFFERENTIAL/PLATELET
Abs Immature Granulocytes: 0.02 10*3/uL (ref 0.00–0.07)
Basophils Absolute: 0 10*3/uL (ref 0.0–0.1)
Basophils Relative: 0 %
Eosinophils Absolute: 0.1 10*3/uL (ref 0.0–0.5)
Eosinophils Relative: 2 %
HCT: 41.4 % (ref 39.0–52.0)
Hemoglobin: 12.7 g/dL — ABNORMAL LOW (ref 13.0–17.0)
Immature Granulocytes: 0 %
Lymphocytes Relative: 19 %
Lymphs Abs: 1.1 10*3/uL (ref 0.7–4.0)
MCH: 25.7 pg — ABNORMAL LOW (ref 26.0–34.0)
MCHC: 30.7 g/dL (ref 30.0–36.0)
MCV: 83.8 fL (ref 80.0–100.0)
Monocytes Absolute: 0.3 10*3/uL (ref 0.1–1.0)
Monocytes Relative: 5 %
Neutro Abs: 4.4 10*3/uL (ref 1.7–7.7)
Neutrophils Relative %: 74 %
Platelets: 180 10*3/uL (ref 150–400)
RBC: 4.94 MIL/uL (ref 4.22–5.81)
RDW: 14.5 % (ref 11.5–15.5)
WBC: 6 10*3/uL (ref 4.0–10.5)
nRBC: 0 % (ref 0.0–0.2)

## 2020-08-10 LAB — COMPREHENSIVE METABOLIC PANEL
ALT: 22 U/L (ref 0–44)
AST: 18 U/L (ref 15–41)
Albumin: 4.5 g/dL (ref 3.5–5.0)
Alkaline Phosphatase: 92 U/L (ref 38–126)
Anion gap: 8 (ref 5–15)
BUN: 17 mg/dL (ref 8–23)
CO2: 25 mmol/L (ref 22–32)
Calcium: 9.5 mg/dL (ref 8.9–10.3)
Chloride: 108 mmol/L (ref 98–111)
Creatinine, Ser: 1.22 mg/dL (ref 0.61–1.24)
GFR, Estimated: >60 mL/min (ref >60)
Glucose, Bld: 95 mg/dL (ref 70–99)
Potassium: 4.5 mmol/L (ref 3.5–5.1)
Sodium: 141 mmol/L (ref 135–145)
Total Bilirubin: 0.7 mg/dL (ref 0.3–1.2)
Total Protein: 8 g/dL (ref 6.5–8.1)

## 2020-08-10 MED ORDER — LORAZEPAM 1 MG PO TABS: 0.5000 mg | ORAL_TABLET | Freq: Three times a day (TID) | ORAL | 0 refills | Status: DC | PRN

## 2020-08-10 MED ORDER — LORAZEPAM 1 MG PO TABS
1.0000 mg | ORAL_TABLET | Freq: Once | ORAL | Status: AC
  Administered 2020-08-10: 1 mg via ORAL
  Filled 2020-08-10: qty 1

## 2020-08-10 MED ORDER — MECLIZINE HCL 12.5 MG PO TABS: 12.5000 mg | ORAL_TABLET | Freq: Three times a day (TID) | ORAL | 0 refills | Status: DC

## 2020-08-10 MED ORDER — MECLIZINE HCL 25 MG PO TABS
25.0000 mg | ORAL_TABLET | Freq: Once | ORAL | Status: AC
  Administered 2020-08-10: 25 mg via ORAL
  Filled 2020-08-10: qty 1

## 2020-08-10 NOTE — Discharge Instructions (Addendum)
Like we discussed, I am prescribing you 2 medications.  The first medication is called meclizine.  I am prescribing you 12.5 mg tablets that you can take 3 times a day for the next 7 days.  This medication can be sedating, so do not mix this with alcohol.  Do not operate a motor vehicle after taking the medication.  I am also prescribing you Ativan.  This is a benzodiazepine that will also help with dizziness but is also a sedating medication.  Again, do not mix this with alcohol and do not drive a car after taking.  You only need to take this as needed if meclizine is not controlling your dizziness.  Please follow-up with your regular doctor within the next week.  If your symptoms worsen, you can return to the emergency department for reevaluation.  It was a pleasure to meet you.

## 2020-08-10 NOTE — ED Triage Notes (Signed)
Patient c/o dizziness x 3 days. Patient states he has a history of vertigo.

## 2020-08-10 NOTE — ED Provider Notes (Signed)
[facility] DEPT Provider Note   CSN: [id number] Arrival date & time: 08/10/20  0909     History Chief Complaint  Patient presents with  . Dizziness    David Smith is a 65 y.o. male.  HPI   Patient is a 65 year old male with a history of heart murmur, hypertension, A. fib, prostate cancer, who presents the emergency department due to dizziness.  Patient reports a history of vertigo in the past.  Typically it happens weekly and only occurs with movement.  3 days ago he woke up and states that when opening his eyes he was immediately dizzy.  He describes a "as feeling off balance" similar to "being on a boat".  Denies the room spins when this occurs.  States that for the past 3 days he becomes dizzy with even minimal movement.  He took a dose of meclizine when his symptoms first started and denies any relief.  He took an additional dose 2 days ago and once again had no relief.  He does note that he has a history of prostate cancer and is also taking Eliquis for his A. fib.  He states last week he was experiencing hematuria and when this occurs he discontinues his Eliquis until it resolves.  He stopped taking his Eliquis for about 5 days and then resumed a few days ago.  He reports some chronic tingling in the tips of his fingers but states this has not worsened.  No other regions of numbness, weakness, falls, injury, chest pain, shortness of breath, abdominal pain, nausea, vomiting, diarrhea.  Denies any recent illnesses.     Past Medical History:  Diagnosis Date  . Chronic lower back pain 06-11-12   daily a problem Pain level 7, Tylenol helpful(down to level 2)  . H/O cardiovascular stress test    non ischemic EF 55% 05-15-2007  . Heart murmur    dx as a teen, Stress ECHO 2008 (-)   . Hypertension   . Prostate cancer metastatic to intrapelvic lymph node (HCC)    gleason 4+3=7, volume 56.4 cc, PSA 34.25    Patient Active Problem List   Diagnosis Date  Noted  . Atrial fibrillation with RVR ([place]) 03/02/2020  . HOCM (hypertrophic obstructive cardiomyopathy) ([place]) 03/02/2020  . Spinal cord compression ([place]) 01/10/2016  . PCP NOTES >>>>>>>>>>>>>>>>>>>>>>>> 08/18/2015  . Other malaise and fatigue 11/14/2013  . Prostate cancer ([place])   . Annual physical exam 11/23/2011  . Hip pain 09/29/2011  . Hyperlipidemia 06/17/2007  . HYPERTENSION, BENIGN ESSENTIAL 04/12/2007    Past Surgical History:  Procedure Laterality Date  . ACHILLES TENDON REPAIR     26 yrs ago, right foot  . ANAL FISSURECTOMY  2008  . dental procedure  06-11-12   minor -office procedure to correct jaw issue-used anesthesia gas to do  . LYMPHADENECTOMY  06/17/2012   Procedure: LYMPHADENECTOMY;  Surgeon: [name], MD;  Location: [facility];  Service: Urology;  Laterality: Bilateral;  . PROSTATE BIOPSY    . ROBOT ASSISTED LAPAROSCOPIC RADICAL PROSTATECTOMY  06/17/2012   Procedure: ROBOTIC ASSISTED LAPAROSCOPIC RADICAL PROSTATECTOMY LEVEL 2;  Surgeon: [name], MD;  Location: [facility];  Service: Urology;  Laterality: N/A;          Family History  Problem Relation Age of Onset  . Cancer Mother        breast  . Heart disease Mother   . Cancer Father 19       prostate, removed  .  Cancer Brother 46       prostate, tx w/xrt  . Colon cancer Neg Hx   . Colon polyps Neg Hx     Social History   Tobacco Use  . Smoking status: Never Smoker  . Smokeless tobacco: Never Used  Vaping Use  . Vaping Use: Never used  Substance Use Topics  . Alcohol use: Not Currently    Alcohol/week: 0.0 standard drinks  . Drug use: No    Home Medications Prior to Admission medications   Medication Sig Start Date End Date Taking? Authorizing Provider  LORazepam (ATIVAN) 1 MG tablet Take 0.5 tablets (0.5 mg total) by mouth every 8 (eight) hours as needed (Dizziness that is not alleviated by meclizine.). 08/10/20  Yes Rayna Sexton, PA-C  meclizine (ANTIVERT) 12.5 MG tablet Take 1 tablet (12.5  mg total) by mouth 3 (three) times daily for 7 days. 08/10/20 08/17/20 Yes Rayna Sexton, PA-C  apixaban (ELIQUIS) 5 MG TABS tablet Take 1 tablet (5 mg total) by mouth 2 (two) times daily. 03/31/20   O'NealCassie Freer, MD  Ascorbic Acid (VITAMIN C PO) Take 1 tablet by mouth daily.    [provider]  Cyanocobalamin (VITAMIN B 12 PO) Take 1 tablet by mouth daily.    [provider]  diltiazem (CARDIZEM CD) 240 MG 24 hr capsule Take 1 capsule (240 mg total) by mouth daily. 03/31/20   O'NealCassie Freer, MD  leuprolide (LUPRON) 30 MG injection Inject 60 mg into the muscle every 6 (six) months.     Raynelle Bring, MD  Multiple Vitamin (MULTIVITAMIN WITH MINERALS) TABS tablet Take 1 tablet by mouth daily.    [provider]  Omega-3 Fatty Acids (FISH OIL PO) Take 1 capsule by mouth daily.    [provider]  rosuvastatin (CRESTOR) 20 MG tablet Take 1 tablet (20 mg total) by mouth daily. 03/31/20 06/29/20  O'NealCassie Freer, MD    Allergies    Patient has no known allergies.  Review of Systems   Review of Systems  All other systems reviewed and are negative. Ten systems reviewed and are negative for acute change, except as noted in the HPI.   Physical Exam Updated Vital Signs BP (!) 153/89 (BP Location: Left Arm)   Pulse 61   Temp 97.9 F (36.6 C) (Oral)   Resp 13   Ht 6\' 1"  (1.854 m)   Wt 115.7 kg   SpO2 99%   BMI 33.64 kg/m   Physical Exam Vitals and nursing note reviewed.  Constitutional:      General: He is not in acute distress.    Appearance: Normal appearance. He is not ill-appearing, toxic-appearing or diaphoretic.  HENT:     Head: Normocephalic and atraumatic.     Right Ear: External ear normal.     Left Ear: External ear normal.     Nose: Nose normal.     Mouth/Throat:     Mouth: Mucous membranes are moist.     Pharynx: Oropharynx is clear. No oropharyngeal exudate or posterior oropharyngeal erythema.  Eyes:     General:  No scleral icterus.       Right eye: No discharge.        Left eye: No discharge.     Extraocular Movements: Extraocular movements intact.     Conjunctiva/sclera: Conjunctivae normal.     Pupils: Pupils are equal, round, and reactive to light.  Cardiovascular:     Rate and Rhythm: Normal rate and regular rhythm.  Pulses: Normal pulses.     Heart sounds: Murmur heard.  No friction rub. No gallop.   Pulmonary:     Effort: Pulmonary effort is normal. No respiratory distress.     Breath sounds: Normal breath sounds. No stridor. No wheezing, rhonchi or rales.  Abdominal:     General: Abdomen is flat.     Palpations: Abdomen is soft.     Tenderness: There is no abdominal tenderness.  Musculoskeletal:        General: Normal range of motion.     Cervical back: Normal range of motion and neck supple. No tenderness.  Skin:    General: Skin is warm and dry.  Neurological:     General: No focal deficit present.     Mental Status: He is alert and oriented to person, place, and time.     Comments: Patient is oriented to person, place, and time. Patient phonates in clear, complete, and coherent sentences. Negative arm drift. Finger to nose intact bilaterally with no visible signs of dysmetria. Strength is 5/5 in all four extremities. Distal sensation intact in all four extremities.  Horizontal unilateral nystagmus noted with extraocular movements.  Corrective saccade noted with head impulse test.  Patient is able to stand and ambulate unassisted but does note worsening vertiginous symptoms.  Psychiatric:        Mood and Affect: Mood normal.        Behavior: Behavior normal.    ED Results / Procedures / Treatments   Labs (all labs ordered are listed, but only abnormal results are displayed) Labs Reviewed  CBC WITH DIFFERENTIAL/PLATELET - Abnormal; Notable for the following components:      Result Value   Hemoglobin 12.7 (*)    MCH 25.7 (*)    All other components within normal limits   COMPREHENSIVE METABOLIC PANEL    EKG None  Radiology MR BRAIN WO CONTRAST  Result Date: 08/10/2020 CLINICAL DATA:  Dizziness EXAM: MRI HEAD WITHOUT CONTRAST TECHNIQUE: Multiplanar, multiecho pulse sequences of the brain and surrounding structures were obtained without intravenous contrast. COMPARISON:  2017 FINDINGS: Susceptibility artifact from dental work. Portions of the brain are obscured particularly on DWI and SWI. Brain: There is no acute infarction or intracranial hemorrhage. There is no intracranial mass, mass effect, or edema. There is no hydrocephalus or extra-axial fluid collection. Ventricles and sulci are within normal limits in size and configuration. Minimal foci of T2 hyperintensity in the supratentorial white matter are nonspecific but may reflect minor chronic microvascular ischemic changes. Vascular: Major vessel flow voids at the skull base are preserved. Skull and upper cervical spine: Normal marrow signal is preserved. Sinuses/Orbits: Minor mucosal thickening.  Orbits are unremarkable. Other: Sella is unremarkable.  Mastoid air cells are clear. IMPRESSION: Artifact from dental amalgam. No acute infarction, hemorrhage, or mass. Minor chronic microvascular ischemic changes. Electronically Signed   By: Macy Mis M.D.   On: 08/10/2020 14:11   Procedures Procedures   Medications Ordered in ED Medications  meclizine (ANTIVERT) tablet 25 mg (25 mg Oral Given 08/10/20 1242)  LORazepam (ATIVAN) tablet 1 mg (1 mg Oral Given 08/10/20 1242)   ED Course  I have reviewed the triage vital signs and the nursing notes.  Pertinent labs & imaging results that were available during my care of the patient were reviewed by me and considered in my medical decision making (see chart for details).  Clinical Course as of 08/10/20 [place] Aug 10, 2020  [place]  IMPRESSION: Artifact from dental amalgam. No acute infarction, hemorrhage, or mass. Minor chronic microvascular  ischemic changes. [LJ]    Clinical Course User Index [LJ] Rayna Sexton, PA-C   MDM Rules/Calculators/A&P                          Pt is a 65 y.o. male who presents the emergency department due to what appears to be vertigo.  Labs: CBC with a hemoglobin of 12.7 and an MCH of 25.7. CMP without abnormalities.  Imaging: MRI of the brain shows no acute infarction, hemorrhage, or mass.  There are minor chronic microvascular ischemic changes.  I, Rayna Sexton, PA-C, personally reviewed and evaluated these images and lab results as part of my medical decision-making.  Patient given a dose of Antivert as well as Ativan in the emergency department.  Imaging and lab work is reassuring.  Patient ambulated and now states he is feeling much better.  He is ambulating unassisted without difficulty.  He did have some unilateral horizontal nystagmus as well as a corrective saccade with head impulse test.  Symptoms appear peripheral.  Will discharge on a 1 week course of meclizine.  As needed Ativan.  We discussed safety regarding these medications.  Discussed return precautions.  Recommended PCP follow-up within the next week.  Patient verbalized understanding of the above.  Feel that he is stable for discharge at this time and he is agreeable.  His questions were answered and he was amicable at the time of discharge.  Note: Portions of this report may have been transcribed using voice recognition software. Every effort was made to ensure accuracy; however, inadvertent computerized transcription errors may be present.   Final Clinical Impression(s) / ED Diagnoses Final diagnoses:  Dizziness   Rx / DC Orders ED Discharge Orders         Ordered    meclizine (ANTIVERT) 12.5 MG tablet  3 times daily        08/10/20 1443    LORazepam (ATIVAN) 1 MG tablet  Every 8 hours PRN        08/10/20 1443           Rayna Sexton, PA-C 08/10/20 1453    Carmin Muskrat, MD 08/12/20 2352

## 2020-08-12 ENCOUNTER — Encounter: Payer: Self-pay | Admitting: Internal Medicine

## 2020-08-13 DIAGNOSIS — C61 Malignant neoplasm of prostate: Secondary | ICD-10-CM | POA: Diagnosis not present

## 2020-08-13 LAB — PSA: PSA: <0.015

## 2020-08-17 ENCOUNTER — Ambulatory Visit (INDEPENDENT_AMBULATORY_CARE_PROVIDER_SITE_OTHER): Payer: Medicare HMO | Admitting: Internal Medicine

## 2020-08-17 ENCOUNTER — Encounter: Payer: Self-pay | Admitting: Internal Medicine

## 2020-08-17 ENCOUNTER — Other Ambulatory Visit: Payer: Self-pay

## 2020-08-17 VITALS — BP 142/78 | HR 69 | Temp 98.6°F | Resp 18 | Ht 73.0 in | Wt 261.0 lb

## 2020-08-17 DIAGNOSIS — R42 Dizziness and giddiness: Secondary | ICD-10-CM

## 2020-08-17 DIAGNOSIS — E785 Hyperlipidemia, unspecified: Secondary | ICD-10-CM

## 2020-08-17 DIAGNOSIS — I421 Obstructive hypertrophic cardiomyopathy: Secondary | ICD-10-CM | POA: Diagnosis not present

## 2020-08-17 DIAGNOSIS — R202 Paresthesia of skin: Secondary | ICD-10-CM | POA: Diagnosis not present

## 2020-08-17 MED ORDER — MECLIZINE HCL 25 MG PO TABS: 25.0000 mg | ORAL_TABLET | Freq: Three times a day (TID) | ORAL | 0 refills | Status: DC | PRN

## 2020-08-17 NOTE — Patient Instructions (Addendum)
We are referring you to Dr. Susa Raring  If you have severe symptoms let us know  Schedule a follow-up here in 4 months from today

## 2020-08-17 NOTE — Progress Notes (Signed)
Subjective:    Patient ID: David Smith, male    DOB: 10-23-1955, 65 y.o.   MRN: [id number]  DOS:  08/17/2020 Type of visit - description:  ER follow-up, here with his wife ( last visit here 05/2019)  [name] to the ER 08/10/2020: He has episodic dizziness for years, went to the ER because the dizziness was steady and severe and worse by moving his head. At the ER, neurological exam essentially benign/symmetric.   Brain MRI: No acute. Blood work essentially normal with a hemoglobin of 12.7, previous hemoglobin 12.2. EKG with no acute changes.  ALSO since the last visit he has been diagnosed with A. Fib, cardiomyopathy.   Review of Systems Since he left the ER, he still has some mild but persistent dizziness, worse with head motion. Meclizine 12.5 mg 3 times daily does not help as much as a higher dose.  Lorazepam does not help. Denies any headache per se. No nausea No neck pain Has chronic back pain on and off No blood in the stools or in the urine. He continue experiencing tingling in the fingers and numbness in both legs this time from the knees.   Past Medical History:  Diagnosis Date   Chronic lower back pain 06-11-12   daily a problem Pain level 7, Tylenol helpful(down to level 2)   H/O cardiovascular stress test    non ischemic EF 55% 05-15-2007   Heart murmur    dx as a teen, Stress ECHO 2008 (-)    Hypertension    Prostate cancer metastatic to intrapelvic lymph node (HCC)    gleason 4+3=7, volume 56.4 cc, PSA 34.25    Past Surgical History:  Procedure Laterality Date   ACHILLES TENDON REPAIR     26 yrs ago, right foot   ANAL FISSURECTOMY  2008   dental procedure  06-11-12   minor -office procedure to correct jaw issue-used anesthesia gas to do   LYMPHADENECTOMY  06/17/2012   Procedure: LYMPHADENECTOMY;  Surgeon: [name], MD;  Location: [facility];  Service: Urology;  Laterality: Bilateral;   PROSTATE BIOPSY     ROBOT ASSISTED LAPAROSCOPIC RADICAL  PROSTATECTOMY  06/17/2012   Procedure: ROBOTIC ASSISTED LAPAROSCOPIC RADICAL PROSTATECTOMY LEVEL 2;  Surgeon: [name], MD;  Location: [facility];  Service: Urology;  Laterality: N/A;       Allergies as of 08/17/2020   No Known Allergies     Medication List       Accurate as of August 17, 2020 11:59 PM. If you have any questions, ask your nurse or doctor.        apixaban 5 MG Tabs tablet Commonly known as: ELIQUIS Take 1 tablet (5 mg total) by mouth 2 (two) times daily.   diltiazem 240 MG 24 hr capsule Commonly known as: CARDIZEM CD Take 1 capsule (240 mg total) by mouth daily.   FISH OIL PO Take 1 capsule by mouth daily.   leuprolide 30 MG injection Commonly known as: LUPRON Inject 60 mg into the muscle every 6 (six) months.   LORazepam 1 MG tablet Commonly known as: Ativan Take 0.5 tablets (0.5 mg total) by mouth every 8 (eight) hours as needed (Dizziness that is not alleviated by meclizine.).   meclizine 25 MG tablet Commonly known as: ANTIVERT Take 1 tablet (25 mg total) by mouth 3 (three) times daily as needed for dizziness. What changed:   medication strength  how much to take  when to take this  reasons to take  this Changed by: [name], MD   multivitamin with minerals Tabs tablet Take 1 tablet by mouth daily.   rosuvastatin 20 MG tablet Commonly known as: CRESTOR Take 1 tablet (20 mg total) by mouth daily.   VITAMIN B 12 PO Take 1 tablet by mouth daily.   VITAMIN C PO Take 1 tablet by mouth daily.          Objective:   Physical Exam BP (!) 142/78 (BP Location: Left Arm, Patient Position: Sitting, Cuff Size: Normal)    Pulse 69    Temp 98.6 F (37 C) (Oral)    Resp 18    Ht 6\' 1"  (1.854 m)    Wt 261 lb (118.4 kg)    SpO2 97%    BMI 34.43 kg/m  General:   Well developed, NAD, BMI noted. HEENT:  Normocephalic . Face symmetric, atraumatic Lungs:  CTA B Normal respiratory effort, no intercostal retractions, no accessory muscle use. Heart:  Seems regular. Lower extremities: no pretibial edema bilaterally  Skin: Not pale. Not jaundice Neurologic:  alert & oriented X3.  Speech normal, gait assisted by a cane by his wife. DTR symmetric (except R ankle jerks seem decreased), no hyperreflexia. Motor symmetric. Psych--  Cognition and judgment appear intact.  Cooperative with normal attention span and concentration.  Behavior appropriate. No anxious or depressed appearing.      Assessment     Assessment No transfusions, Jehovah witness hyperglycemia: 01-2016 A1C 6.0 HTN Dyslipidemia Insomnia-- no need for med as off 07/2017 Chronic back pain, offered surgery at some point, declined  CV: ---New onset A. fib (02-2020) ---New diagnosis HOCM (02-2020) --- Ascending Aorta dilatation GU: --Prostate cancer, Hormone sensitive.DX 2013, PSA 34.25, s/p prostatectomy, lymphadenectomy 2014. S/p XRT; + lymph nods mets , PSA increased, s/p lupron   --Hematuria- CT -cysto 2016 per urology-- likely d/t h/o XRT Mild anemia, normal iron and ferritin 06-2015 H/o heart murmur, stress echo 2008 negative Syncope, dizziness  headaches:  -Saw neurology 4-17,w/u done, CTA head, neck, then neck MRI: dx w/ spinal compression, saw surgery 10-2015: rx non emergent surgery (no surgery as off 01-2017), finding was not causing HAs-dizzines  -Cervical spinal compression per MRI 09-2015 Shingles, R face, 03-2016, saw ophtalmology  PLAN: Dizziness, paresthesias fingers and legs: Chronic, worse lately, recent MRI showed no acute changes. Last seen by neurology in 2017, at that time CT A head and neck negative but a neck MRI showed spinal compression.  Surgery was offered by Dr. Christella Noa, not pursued. Since dizziness is persisting I will ask neurology to see patient again.  I also wonder the role of neck spinal compression on his paresthesias. Until then, we will increase meclizine to 25 mg 3 times daily which seems to work better for him, denies  oversedation. Has lorazepam but essentially does not take it, it does not help much. [place]- A. Fib (new dx 02-2020), ascending aortic dilatation Saw cardiology 06-2020:   He was noted to be stable clinically on diltiazem, Eliquis. High cholesterol: On Crestor, last FLP = excellent control RTC 4 months   This visit occurred during the SARS-CoV-2 public health emergency.  Safety protocols were in place, including screening questions prior to the visit, additional usage of staff PPE, and extensive cleaning of exam room while observing appropriate contact time as indicated for disinfecting solutions.

## 2020-08-18 NOTE — Assessment & Plan Note (Signed)
Dizziness, paresthesias fingers and legs: Chronic, worse lately, recent MRI showed no acute changes. Last seen by neurology in 2017, at that time CT A head and neck negative but a neck MRI showed spinal compression.  Surgery was offered by Dr. Christella Noa, not pursued. Since dizziness is persisting I will ask neurology to see patient again.  I also wonder the role of neck spinal compression on his paresthesias. Until then, we will increase meclizine to 25 mg 3 times daily which seems to work better for him, denies oversedation. Has lorazepam but essentially does not take it, it does not help much. [place]- A. Fib (new dx 02-2020), ascending aortic dilatation Saw cardiology 06-2020:   He was noted to be stable clinically on diltiazem, Eliquis. High cholesterol: On Crestor, last FLP = excellent control RTC 4 months

## 2020-08-20 ENCOUNTER — Encounter: Payer: Self-pay | Admitting: Neurology

## 2020-08-20 DIAGNOSIS — C775 Secondary and unspecified malignant neoplasm of intrapelvic lymph nodes: Secondary | ICD-10-CM | POA: Diagnosis not present

## 2020-08-20 DIAGNOSIS — C61 Malignant neoplasm of prostate: Secondary | ICD-10-CM | POA: Diagnosis not present

## 2020-08-25 ENCOUNTER — Encounter: Payer: Self-pay | Admitting: Internal Medicine
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
## 2020-10-25 ENCOUNTER — Ambulatory Visit: Payer: Medicare HMO | Admitting: Neurology

## 2020-10-25 ENCOUNTER — Encounter: Payer: Self-pay | Admitting: Neurology

## 2020-10-25 ENCOUNTER — Other Ambulatory Visit: Payer: Self-pay

## 2020-10-25 VITALS — BP 137/81 | HR 72 | Ht 73.0 in | Wt 260.8 lb

## 2020-10-25 DIAGNOSIS — R42 Dizziness and giddiness: Secondary | ICD-10-CM | POA: Diagnosis not present

## 2020-10-25 DIAGNOSIS — G959 Disease of spinal cord, unspecified: Secondary | ICD-10-CM | POA: Diagnosis not present

## 2020-10-25 NOTE — Patient Instructions (Signed)
I think the dizziness is from the neck (cervicogenic dizziness/cervicogenic vertigo).  Given the damage to the spinal cord in the neck, I would like you re-evaluated by neurosurgery.

## 2020-11-09 ENCOUNTER — Telehealth: Payer: Self-pay | Admitting: Internal Medicine

## 2020-11-09 DIAGNOSIS — R42 Dizziness and giddiness: Secondary | ICD-10-CM

## 2020-11-09 DIAGNOSIS — M4802 Spinal stenosis, cervical region: Secondary | ICD-10-CM | POA: Diagnosis not present

## 2020-11-09 NOTE — Telephone Encounter (Signed)
Patient states he when to the neurology and they would like for you to refer him to ENT

## 2020-11-09 NOTE — Telephone Encounter (Signed)
Please advise 

## 2020-11-10 NOTE — Telephone Encounter (Signed)
Saw neurosurgery yesterday, Dr. Christella Noa, please get office visit note

## 2020-11-10 NOTE — Telephone Encounter (Signed)
Unable to obtain- must have a written/signed ROI.

## 2020-11-10 NOTE — Telephone Encounter (Signed)
Obtain ROI from patient

## 2020-11-10 NOTE — Telephone Encounter (Signed)
Mychart message sent asking Pt to come by the complete ROI.

## 2020-11-17 NOTE — Telephone Encounter (Signed)
OV note received from [place]. OV note placed in red folder.

## 2020-11-18 ENCOUNTER — Other Ambulatory Visit: Payer: Self-pay | Admitting: Neurosurgery

## 2020-11-18 DIAGNOSIS — C61 Malignant neoplasm of prostate: Secondary | ICD-10-CM | POA: Diagnosis not present

## 2020-11-18 DIAGNOSIS — M4802 Spinal stenosis, cervical region: Secondary | ICD-10-CM

## 2020-11-18 NOTE — Telephone Encounter (Signed)
Neurosurgery note reviewed. Vertigo not related to spinal cord compression.  They are planning spinal cord dedicated MRI for further advise  Plan: ENT referral, DX vertigo.

## 2020-11-18 NOTE — Addendum Note (Signed)
Addended byDamita Dunnings D on: 11/18/2020 12:12 PM   Modules accepted: Orders

## 2020-11-18 NOTE — Telephone Encounter (Signed)
Referral placed.

## 2020-12-01 ENCOUNTER — Other Ambulatory Visit: Payer: Self-pay

## 2020-12-01 ENCOUNTER — Ambulatory Visit
Admission: RE | Admit: 2020-12-01 | Discharge: 2020-12-01 | Disposition: A | Discharge status: Home / Self Care | Payer: Medicare HMO | Source: Ambulatory Visit | Attending: Neurosurgery | Admitting: Neurosurgery

## 2020-12-01 DIAGNOSIS — M4802 Spinal stenosis, cervical region: Secondary | ICD-10-CM

## 2020-12-03 ENCOUNTER — Other Ambulatory Visit: Payer: Medicare HMO

## 2020-12-07 DIAGNOSIS — M488X2 Other specified spondylopathies, cervical region: Secondary | ICD-10-CM | POA: Diagnosis not present

## 2020-12-07 DIAGNOSIS — M4802 Spinal stenosis, cervical region: Secondary | ICD-10-CM | POA: Diagnosis not present

## 2020-12-09 ENCOUNTER — Other Ambulatory Visit: Payer: Self-pay | Admitting: Neurosurgery

## 2020-12-09 ENCOUNTER — Telehealth: Payer: Self-pay | Admitting: *Deleted

## 2020-12-09 NOTE — Telephone Encounter (Signed)
   [facility] Pre-operative Risk Assessment    Patient Name: David Smith  DOB: 1955/07/18 MRN: [id number]  HEARTCARE STAFF:  - IMPORTANT!!!!!! Under Visit Info/Reason for Call, type in Other and utilize the format Clearance MM/DD/YY or Clearance TBD. Do not use dashes or single digits. - Please review there is not already an duplicate clearance open for this procedure. - If request is for dental extraction, please clarify the # of teeth to be extracted. - If the patient is currently at the dentist's office, call Pre-Op Callback Staff (MA/nurse) to input urgent request.  - If the patient is not currently in the dentist office, please route to the Pre-Op pool.  Request for surgical clearance:  What type of surgery is being performed? C3 corpectomy   When is this surgery scheduled? 01/05/21   What type of clearance is required (medical clearance vs. Pharmacy clearance to hold med vs. Both)? both  Are there any medications that need to be held prior to surgery and how long?eliquis and aspirin- please advise   Practice name and name of physician performing surgery? [facility]   What is the office phone number? [phone]   7.   What is the office fax number? [phone]  8.   Anesthesia type (None, local, MAC, general) ? Not listed   Fredia Beets 12/09/2020, 4:00 PM  _________________________________________________________________   (provider comments below)

## 2020-12-10 NOTE — Telephone Encounter (Signed)
   Name: David Smith  DOB: 10/11/1955  MRN: [id number]   Primary Cardiologist: Evalina Field, MD  Chart reviewed as part of pre-operative protocol coverage. Patient was contacted 12/10/2020 in reference to pre-operative risk assessment for pending surgery as outlined below.  David Smith was last seen 06/2020 with sigmoid variant HOCM, PAF, HTN, dilated ascending aorta 75mm 02/2020, prostate CA, HLD. Stress echo 06/2020 was negative for ischemia, but there was stress induced outflow tract obstruction.  I reached out to patient for update on how he is doing. The patient affirms he has been doing well without any new cardiac symptoms. 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 No SOB, palpitations, dizziness, syncope. He confirms he is not on ASA, only Eliquis. The patient was advised that if he develops new symptoms prior to surgery to contact our office to arrange for a follow-up visit, and he verbalized understanding.  Given his prior stress echo findings 06/2020 I will reach out to Dr. Audie Box to find out if there are any specific preop recommendations going into surgery due to his hypertrophic cardiomyopathy and outflow obstruction by stress echo 06/2020. Clinically he sounds stable. Dr. Audie Box - Please route response to P CV DIV PREOP (the pre-op pool). Thank you.  I will also route to pharm team for input on Eliquis. Once final recs are known we will route to surgeon's office to finalize clearance.    Charlie Pitter, PA-C 12/10/2020, 8:24 AM

## 2020-12-10 NOTE — Telephone Encounter (Addendum)
    David Smith DOB:  [birth year]  MRN:  [id number]   Primary Cardiologist: Evalina Field, MD  Chart reviewed as part of pre-operative protocol coverage. To summarize, given past medical history and time since last visit, based on ACC/AHA guidelines, David Smith would be at acceptable risk for the planned procedure without further cardiovascular testing.   EDITED TO ADD: Per Dr. Audie Box, Volume status should be closely monitored during surgery but otherwise no cardiac testing needed before procedure.  The patient was advised that if he develops new symptoms prior to surgery to contact our office to arrange for a follow-up visit, and he verbalized understanding.  Regarding anticoagulation, the pharmacy team reviewed his chart and recommends "Per office protocol, patient can hold Eliquis for 3 days prior to procedure."  As below, the patient confirmed today he is not on aspirin (this is not on our MAR either).  I will route this recommendation to the requesting party via Epic fax function and remove from pre-op pool. (Refaxed to include addendum above.)  Please call with questions.  Charlie Pitter, PA-C 12/10/2020, 9:25 AM

## 2020-12-10 NOTE — Telephone Encounter (Signed)
Patient with diagnosis of afib on Eliquis for anticoagulation.    Procedure: Cervical 3 corpectomy Date of procedure: 01/05/21  CHA2DS2-VASc Score = 1  This indicates a 0.6% annual risk of stroke. The patient's score is based upon: CHF History: No HTN History: Yes Diabetes History: No Stroke History: No Vascular Disease History: No Age Score: 0 Gender Score: 0   CrCl 8mL/min using adjusted body weight Platelet count 180K  Per office protocol, patient can hold Eliquis for 3 days prior to procedure. Pre-op team to address aspirin hold.

## 2020-12-16 ENCOUNTER — Encounter: Payer: Self-pay | Admitting: Gastroenterology

## 2020-12-17 ENCOUNTER — Ambulatory Visit (INDEPENDENT_AMBULATORY_CARE_PROVIDER_SITE_OTHER): Payer: Medicare HMO | Admitting: Internal Medicine

## 2020-12-17 ENCOUNTER — Encounter: Payer: Self-pay | Admitting: Internal Medicine

## 2020-12-17 ENCOUNTER — Other Ambulatory Visit: Payer: Self-pay

## 2020-12-17 VITALS — BP 152/84 | HR 77 | Temp 98.5°F | Resp 18 | Ht 73.0 in | Wt 262.5 lb

## 2020-12-17 DIAGNOSIS — R739 Hyperglycemia, unspecified: Secondary | ICD-10-CM | POA: Diagnosis not present

## 2020-12-17 DIAGNOSIS — M4802 Spinal stenosis, cervical region: Secondary | ICD-10-CM | POA: Diagnosis not present

## 2020-12-17 DIAGNOSIS — I10 Essential (primary) hypertension: Secondary | ICD-10-CM

## 2020-12-17 DIAGNOSIS — I4891 Unspecified atrial fibrillation: Secondary | ICD-10-CM

## 2020-12-17 LAB — HEMOGLOBIN A1C: Hgb A1c MFr Bld: 6.3 % (ref 4.6–6.5)

## 2020-12-17 LAB — CBC WITH DIFFERENTIAL/PLATELET
Basophils Absolute: 0 10*3/uL (ref 0.0–0.1)
Basophils Relative: 0.6 % (ref 0.0–3.0)
Eosinophils Absolute: 0.2 10*3/uL (ref 0.0–0.7)
Eosinophils Relative: 4 % (ref 0.0–5.0)
HCT: 37.5 % — ABNORMAL LOW (ref 39.0–52.0)
Hemoglobin: 12.3 g/dL — ABNORMAL LOW (ref 13.0–17.0)
Lymphocytes Relative: 26.1 % (ref 12.0–46.0)
Lymphs Abs: 1.4 10*3/uL (ref 0.7–4.0)
MCHC: 32.8 g/dL (ref 30.0–36.0)
MCV: 79.9 fl (ref 78.0–100.0)
Monocytes Absolute: 0.3 10*3/uL (ref 0.1–1.0)
Monocytes Relative: 5.5 % (ref 3.0–12.0)
Neutro Abs: 3.5 10*3/uL (ref 1.4–7.7)
Neutrophils Relative %: 63.8 % (ref 43.0–77.0)
Platelets: 148 10*3/uL — ABNORMAL LOW (ref 150.0–400.0)
RBC: 4.7 Mil/uL (ref 4.22–5.81)
RDW: 14.9 % (ref 11.5–15.5)
WBC: 5.5 10*3/uL (ref 4.0–10.5)

## 2020-12-17 LAB — BASIC METABOLIC PANEL
BUN: 20 mg/dL (ref 6–23)
CO2: 27 mEq/L (ref 19–32)
Calcium: 9.3 mg/dL (ref 8.4–10.5)
Chloride: 108 mEq/L (ref 96–112)
Creatinine, Ser: 1.34 mg/dL (ref 0.40–1.50)
GFR: 56.6 mL/min — ABNORMAL LOW (ref >60.00)
Glucose, Bld: 92 mg/dL (ref 70–99)
Potassium: 4.2 mEq/L (ref 3.5–5.1)
Sodium: 142 mEq/L (ref 135–145)

## 2020-12-17 NOTE — Patient Instructions (Signed)
Check the  blood pressure every other day BP GOAL is between 110/65 and  135/85. If it is consistently higher or lower, let me know     GO TO THE LAB : Get the blood work     Hydro, [place] back for a physical exam in 4 months

## 2020-12-17 NOTE — Progress Notes (Signed)
Subjective:    Patient ID: David Smith, male    DOB: 09-05-1955, 65 y.o.   MRN: [id number]  DOS:  12/17/2020 Type of visit - description: f/u Today with talk about hyperglycemia, dizziness, preventive care. BP noted to be elevated, no recent ambulatory BPs. Good med compliance  BP Readings from Last 3 Encounters:  12/17/20 (!) 152/84  10/25/20 137/81  08/17/20 (!) 142/78     Review of Systems Denies chest pain or difficulty breathing.  No edema. Anticoagulated, no blood in the urine or in the stools  Past Medical History:  Diagnosis Date   Chronic lower back pain 06-11-12   daily a problem Pain level 7, Tylenol helpful(down to level 2)   H/O cardiovascular stress test    non ischemic EF 55% 05-15-2007   Heart murmur    dx as a teen, Stress ECHO 2008 (-)    Hypertension    Prostate cancer metastatic to intrapelvic lymph node (HCC)    gleason 4+3=7, volume 56.4 cc, PSA 34.25    Past Surgical History:  Procedure Laterality Date   ACHILLES TENDON REPAIR     26 yrs ago, right foot   ANAL FISSURECTOMY  2008   dental procedure  06-11-12   minor -office procedure to correct jaw issue-used anesthesia gas to do   LYMPHADENECTOMY  06/17/2012   Procedure: LYMPHADENECTOMY;  Surgeon: [name], MD;  Location: [facility];  Service: Urology;  Laterality: Bilateral;   PROSTATE BIOPSY     ROBOT ASSISTED LAPAROSCOPIC RADICAL PROSTATECTOMY  06/17/2012   Procedure: ROBOTIC ASSISTED LAPAROSCOPIC RADICAL PROSTATECTOMY LEVEL 2;  Surgeon: [name], MD;  Location: [facility];  Service: Urology;  Laterality: N/A;       Allergies as of 12/17/2020   No Known Allergies      Medication List        Accurate as of December 17, 2020  9:07 AM. If you have any questions, ask your nurse or doctor.          apixaban 5 MG Tabs tablet Commonly known as: ELIQUIS Take 1 tablet (5 mg total) by mouth 2 (two) times daily.   diltiazem 240 MG 24 hr capsule Commonly known as: CARDIZEM CD Take 1 capsule  (240 mg total) by mouth daily.   FISH OIL PO Take 1 capsule by mouth daily.   leuprolide 30 MG injection Commonly known as: LUPRON Inject 60 mg into the muscle every 6 (six) months.   LORazepam 1 MG tablet Commonly known as: Ativan Take 0.5 tablets (0.5 mg total) by mouth every 8 (eight) hours as needed (Dizziness that is not alleviated by meclizine.).   meclizine 25 MG tablet Commonly known as: ANTIVERT Take 1 tablet (25 mg total) by mouth 3 (three) times daily as needed for dizziness.   multivitamin with minerals Tabs tablet Take 1 tablet by mouth daily.   rosuvastatin 20 MG tablet Commonly known as: CRESTOR Take 1 tablet (20 mg total) by mouth daily.   VITAMIN B 12 PO Take 1 tablet by mouth daily.   VITAMIN C PO Take 1 tablet by mouth daily.           Objective:   Physical Exam BP (!) 152/84 (BP Location: Left Arm, Patient Position: Sitting, Cuff Size: Normal)   Pulse 77   Temp 98.5 F (36.9 C) (Oral)   Resp 18   Ht 6\' 1"  (1.854 m)   Wt 262 lb 8 oz (119.1 kg)   SpO2 98%  BMI 34.63 kg/m  General:   Well developed, NAD, BMI noted. HEENT:  Normocephalic . Face symmetric, atraumatic Lungs:  CTA B Normal respiratory effort, no intercostal retractions, no accessory muscle use. Heart: RRR,  no murmur.  Lower extremities: no pretibial edema bilaterally  Skin: Not pale. Not jaundice Neurologic:  alert & oriented X3.  Speech normal, gait assisted by a cane  psych--  Cognition and judgment appear intact.  Cooperative with normal attention span and concentration.  Behavior appropriate. No anxious or depressed appearing.      Assessment    Assessment No transfusions, Jehovah witness hyperglycemia: 01-2016 A1C >>> 6.0 HTN Dyslipidemia Insomnia-- no need for med as off 07/2017 Chronic back pain, offered surgery at some point, declined  CV: ---New onset A. fib (02-2020) ---New diagnosis HOCM (02-2020) --- Ascending Aorta dilatation GU: --Prostate  cancer, Hormone sensitive.DX 2013, PSA 34.25, s/p prostatectomy, lymphadenectomy 2014. S/p XRT; + lymph nods mets , PSA increased, s/p lupron   --Hematuria- CT -cysto 2016 per urology-- likely d/t h/o XRT Mild anemia, normal iron and ferritin 06-2015 H/o heart murmur, stress echo 2008 negative Syncope, dizziness  headaches:  -Saw neurology 4-17,w/u done, CTA head, neck, then neck MRI: dx w/ spinal compression, saw surgery 10-2015: rx non emergent surgery (no surgery as off 01-2017), finding was not causing HAs-dizzines  -Cervical spinal compression per MRI 09-2015 Shingles, R face, 03-2016, saw ophtalmology  PLAN: Hyperglycemia: Check A1c HTN: BP is a slightly elevated today, recommend to monitor at home, see AVS.  Continue Cardizem, check a BMP Dizziness, paresthesias fingers and legs: Saw neurology 10/2020 -note reviewed- , DX cervicogenic dizziness, referred to neurosurgery, dx spinal stenosis , was rx corpectomy scheduled for August. Was also referred to ENT (pending) Cervical spinal stenosis: See above [place]- A. Fib (new dx 02-2020), ascending aortic dilatation Last seen by cards 06-2020, due for a visit. Will need cardiology clearance for upcoming surgery. Anticoagulated, check a CBC. Preventive care:  Had COVID vaccines x4, had shingles vaccine x1, plans to get the second shingrix. Due for colonoscopy, see GI letter RTC 4 months CPX      This visit occurred during the SARS-CoV-2 public health emergency.  Safety protocols were in place, including screening questions prior to the visit, additional usage of staff PPE, and extensive cleaning of exam room while observing appropriate contact time as indicated for disinfecting solutions.

## 2020-12-18 NOTE — Assessment & Plan Note (Signed)
Hyperglycemia: Check A1c HTN: BP is a slightly elevated today, recommend to monitor at home, see AVS.  Continue Cardizem, check a BMP Dizziness, paresthesias fingers and legs: Saw neurology 10/2020 -note reviewed- , DX cervicogenic dizziness, referred to neurosurgery, dx spinal stenosis , was rx corpectomy scheduled for August. Was also referred to ENT (pending) Cervical spinal stenosis: See above [place]- A. Fib (new dx 02-2020), ascending aortic dilatation Last seen by cards 06-2020, due for a visit. Will need cardiology clearance for upcoming surgery. Anticoagulated, check a CBC. Preventive care:  Had COVID vaccines x4, had shingles vaccine x1, plans to get the second shingrix. Due for colonoscopy, see GI letter RTC 4 months CPX

## 2020-12-28 ENCOUNTER — Encounter: Payer: Self-pay | Admitting: Internal Medicine

## 2020-12-28 NOTE — Progress Notes (Signed)
Surgical Instructions   Your procedure is scheduled on Wednesday January 05, 2021.  Report to [facility] Main Entrance "A" at 06:30 A.M., then check in with the Admitting office.  Call [phone] if you have problems or questions between now and the morning of surgery:   Remember: Do not eat after midnight the night before your surgery  You may drink clear liquids until 05:30 the morning of your surgery.   Clear liquids allowed are: Water, Non-Citrus Juices (without pulp), Carbonated Beverages, Clear Tea, Black Coffee Only, and Gatorade   Take these medicines the morning of surgery with A SIP OF WATER  Diltiazem (Cardizem) Rosuvastatin (Crestor)   If needed you may take these medications the morning of surgery: Meclizine (Antivert)   As of today, STOP taking any Aspirin (unless otherwise instructed by your surgeon) or Aspirin-containing products; NSAIDS - Aleve, Naproxen, Ibuprofen, Motrin, Advil, Goody's, BC's, all herbal medications, fish oil, and all vitamins.  Per Cardiology, STOP Apixaban (Eliquis) 3 days prior to surgery.          Do not wear jewelry  Do not wear lotions, powders, colognes, or deodorant. Do not shave legs or underarms 48 hours prior to surgery.  Men may shave face and neck. Do not bring valuables to the hospital - [facility] is not responsible for any belongings or valuables.              Do NOT Smoke (Tobacco/Vaping) or drink Alcohol 24 hours prior to your procedure  If you use a CPAP at night, you may bring all equipment for your overnight stay.   Contacts, glasses, hearing aids, dentures or partials may not be worn into surgery, please bring cases for these belongings   For patients admitted to the hospital, discharge time will be determined by your treatment team.   Patients discharged the day of surgery will not be allowed to drive home, and someone needs to stay with them for 24 hours.  ONLY ONE (1) SUPPORT PERSON MAY WAIT IN THE WAITING AREA  WHILE YOU ARE IN SURGERY. NO VISITORS WILL BE ALLOWED IN PRE-OP WHERE PATIENTS GET READY FOR SURGERY.  TWO (2) VISITORS WILL BE ALLOWED IN YOUR ROOM IF YOU ARE ADMITTED AFTER SURGERY.  Minor children may have two parents present. Special consideration for safety and communication needs will be reviewed on a case by case basis.  Special instructions:    Oral Hygiene is also important to reduce your risk of infection.  Remember - BRUSH YOUR TEETH THE MORNING OF SURGERY WITH YOUR REGULAR TOOTHPASTE   [place]- Preparing For Surgery  Before surgery, you can play an important role. Because skin is not sterile, your skin needs to be as free of germs as possible. You can reduce the number of germs on your skin by washing with CHG (chlorahexidine gluconate) Soap before surgery.  CHG is an antiseptic cleaner which kills germs and bonds with the skin to continue killing germs even after washing.     Please do not use if you have an allergy to CHG or antibacterial soaps. If your skin becomes reddened/irritated stop using the CHG.  Do not shave (including legs and underarms) for at least 48 hours prior to first CHG shower. It is OK to shave your face.  Please follow these instructions carefully.     Shower the NIGHT BEFORE SURGERY and the MORNING OF SURGERY with CHG Soap.   If you chose to wash your hair, wash your hair first as  usual with your normal shampoo. After you shampoo, rinse your hair and body thoroughly to remove the shampoo.    Then [employer] and genitals (private parts) with your normal soap and rinse thoroughly to remove soap.  Next use the CHG Soap as you would any other liquid soap. You can apply CHG directly to the skin and wash gently with a clean washcloth.   Apply the CHG Soap to your body ONLY FROM THE NECK DOWN.  Do not use on open wounds or open sores. Avoid contact with your eyes, ears, mouth and genitals (private parts). Wash Face and genitals (private parts)  with your  normal soap.   Wash thoroughly, paying special attention to the area where your surgery will be performed.  Thoroughly rinse your body with warm water from the neck down.  DO NOT shower/wash with your normal soap after using and rinsing off the CHG Soap.  Pat yourself dry with a CLEAN TOWEL.  Wear CLEAN PAJAMAS to bed the night before surgery  Place CLEAN SHEETS on your bed the night before your surgery  DO NOT SLEEP WITH PETS.   Day of Surgery:  Take a shower with CHG soap. Wear Clean/Comfortable clothing the morning of surgery Do not apply any deodorants/lotions.   Remember to brush your teeth WITH YOUR REGULAR TOOTHPASTE.   Please read over the following fact sheets that you were given.

## 2020-12-29 ENCOUNTER — Encounter (HOSPITAL_COMMUNITY): Payer: Self-pay

## 2020-12-29 ENCOUNTER — Encounter (HOSPITAL_COMMUNITY)
Admission: RE | Admit: 2020-12-29 | Discharge: 2020-12-29 | Disposition: A | Discharge status: Home / Self Care | Payer: Medicare HMO | Source: Ambulatory Visit | Attending: Neurosurgery | Admitting: Neurosurgery

## 2020-12-29 ENCOUNTER — Other Ambulatory Visit: Payer: Self-pay

## 2020-12-29 DIAGNOSIS — Z01812 Encounter for preprocedural laboratory examination: Secondary | ICD-10-CM | POA: Diagnosis not present

## 2020-12-29 HISTORY — DX: Obstructive hypertrophic cardiomyopathy: I42.1

## 2020-12-29 HISTORY — DX: Depression, unspecified: F32.A

## 2020-12-29 HISTORY — DX: Paroxysmal atrial fibrillation: I48.0

## 2020-12-29 LAB — NO BLOOD PRODUCTS

## 2020-12-29 LAB — SURGICAL PCR SCREEN
MRSA, PCR: NEGATIVE
Staphylococcus aureus: NEGATIVE

## 2020-12-29 NOTE — Progress Notes (Signed)
PCP - Dr. [name] Cardiologist - Dr. Lake Bells O'Neal  Chest x-ray - 03/02/20 EKG - 08/13/20 Stress Test - 06/08/20 ECHO - 06/08/20  Sleep Study - Denies  Blood Thinner Instructions: Eliquis should be held for 3 days Aspirin Instructions: Denies  ERAS Protcol - Yes  COVID TEST- 12/29/20   Anesthesia review: Yes cardiac history  Patient denies shortness of breath, fever, cough and chest pain at PAT appointment   All instructions explained to the patient, with a verbal understanding of the material. Patient agrees to go over the instructions while at home for a better understanding. Patient also instructed to wear a mask in public after being tested for COVID-19. The opportunity to ask questions was provided.

## 2020-12-30 ENCOUNTER — Encounter (HOSPITAL_COMMUNITY): Payer: Self-pay

## 2020-12-30 NOTE — Progress Notes (Signed)
Anesthesia Chart Review:  Case: [id number] Date/Time: 01/05/21 0815   Procedure: Cervical 3 Corpectomy - 3C/RM 21   Anesthesia type: General   Pre-op diagnosis: Spinal stenosis of cervical region   Location: [facility] OR ROOM 20 / [place] OR   Surgeons: Ashok Pall, MD       DISCUSSION: Patient is a 65 year old male scheduled for the above procedure.  History includes never smoker, HTN, hypertrophic obstructive cardiomyopathy (HCOM; 23 mm septum/sigmoid septum variant), murmur, afib (02/2020), dilated ascending aorta (44 mm 02/2020), chronic back pain, prostate cancer (s/p radical prostatectomy 06/17/12, s/p radiation). BMI is consistent with obesity.  PCP Dr. Larose Kells recommended preoperative cardiology clearance.   He is followed by Dr. Audie Box for [facility] and PAF. Last visit 07/02/20. He notes: HOCM -23 mm septum (sigmoid septum variant) -LGE 2% -negative monitor for NSVT -no syncope -no family history of SCD -LVOTO 57 mmHG -> 10 mmHG with treatment -ETT 06/08/2020 -> Normal BP response to exercise   Cardiology preoperative input outlined on 12/10/20 by Melina Copa, PA-C, "..given past medical history and time since last visit, based on ACC/AHA guidelines, David Smith would be at acceptable risk for the planned procedure without further cardiovascular testing.    EDITED TO ADD: Per Dr. Audie Box, Volume status should be closely monitored during surgery but otherwise no cardiac testing needed before procedure... Regarding anticoagulation, the pharmacy team reviewed his chart and recommends "Per office protocol, patient can hold Eliquis for 3 days prior to procedure."   He had CBC and BMET on 12/17/20. H/H 12.3/37.5.  He has signed refusal of all blood products form.  Anesthesia team to evaluate on the day of surgery.   VS: BP 137/87   Pulse 82   Temp 36.9 C (Oral)   Resp 18   Ht '6\' 1"'$  (1.854 m)   Wt 118.9 kg   SpO2 99%   BMI 34.58 kg/m    PROVIDERS: Colon Branch, MD is PCP Eleonore Chiquito, MD is  cardiologist. Last visit 07/02/20. He wrote, "Stress test with gradient ~30 mmHG at peak exercise.  He reports he is doing well since her last visit.  He is doing activities such as playing tennis without any major shortness of breath.  He still can get winded but he feels he is deconditioned.  He denies any chest pain or rapid heartbeat sensation.  He did purchase the cardia mobile device.  No further recurrence of atrial fibrillation per his report."  Patient is considering evaluation for watchman device.   LABS: Labs from 12/17/20 and 12/29/20 noted. Results included: Lab Results  Component Value Date   WBC 5.5 12/17/2020   HGB 12.3 (L) 12/17/2020   HCT 37.5 (L) 12/17/2020   PLT 148.0 (L) 12/17/2020   GLUCOSE 92 12/17/2020   ALT 22 08/10/2020   AST 18 08/10/2020   NA 142 12/17/2020   K 4.2 12/17/2020   CL 108 12/17/2020   CREATININE 1.34 12/17/2020   BUN 20 12/17/2020   CO2 27 12/17/2020   PSA <0.015 08/13/2020   HGBA1C 6.3 12/17/2020   He signed a refusal of all blood products form.   IMAGES: MRI C-spine 12/01/20: IMPRESSION: 1. Advanced degenerative changes of the cervical spine with superimposed ossification of the posterior longitudinal ligament at C2-3 and C3-4 resulting in severe spinal canal stenosis at these levels with mass effect on the cord, cord atrophy and myelomalacia at the C3 and C4 levels. Spinal cord findings appear stable when compared to prior MRI. 2. Moderate  spinal canal stenosis C4-5, mild-to-moderate at C5-6 and mild at C6-7 and C7-T1. 3. Multilevel high-grade neural foraminal narrowing, severe bilaterally at C3-4 and C5-6 and on the left at C6-7. 4. Interval progression of the degree of neural foraminal stenosis at C4-5 and progression of disc protrusion at C7-T1.   EKG: 08/10/20:  Sinus rhythm Left anterior fascicular block Abnormal R-wave progression, late transition no change from previous Confirmed by Charlesetta Shanks [phone]) on 08/11/2020 4:19:01  PM   CV: Stress Echo 06/08/20: IMPRESSIONS:  1. Baseline echo with normal LV function, mild AI, severe basal septal  hypertrophy, chordal SAM and baseline gradient 1.6 m/s and peak gradient  10 mmHg (with valsalva); with peak HR, peak velocity 2.9 m/s and peak  gradient 34 mmHg.   2. This is a negative stress echocardiogram for ischemia.   3. The findings of the study demonstrate that a stress-induced outflow  tract obstruction is present (peak LVOT gradient 34 mmHG).   4. This is an indeterminate risk study    ZioPatch long term event monitor 03/03/20-03/17/20: Impression: 1. Brief atrial tachycardia episodes detected (longest interval 9 beats; 4 seconds). 2. No atrial fibrillation. 3. No ventricular tachycardia. 4. Rare ectopy.     Cardiac MRI 03/03/20: IMPRESSION: 1. Asymmetric LV hypertrophy measuring up to 74m in basal septum (979min posterior wall), consistent with hypertrophic cardiomyopathy 2. RV insertion site late gadolinium enhancement, consistent with HCM. LGE accounts for 2% of total myocardial mass 3.  Normal LV size with hyperdynamic systolic function (EF [id number].  Normal RV size and systolic function (EF [id number].  Dilated ascending aorta measuring 4417m Echo 03/02/20: IMPRESSIONS   1. Left ventricular ejection fraction, by estimation, is 70 to 75%. The  left ventricle has hyperdynamic function. The left ventricle has no  regional wall motion abnormalities. There is severe left ventricular  hypertrophy of the basal-septal segment.  This measures at least 2.5 cm. There is LVOT obstruction at rest  consistent with HOCM. Peak gradient across the LVOT is 57 mmHg. Valsalva  was not performed. Left ventricular diastolic parameters are consistent  with Grade I diastolic dysfunction (impaired   relaxation). Elevated left ventricular end-diastolic pressure.   2. Right ventricular systolic function is normal. The right ventricular  size is normal.   3. Systolic  anterior motion (SAM) of the anterior leaflet is noted. The  mitral valve is abnormal. Trivial mitral valve regurgitation.   4. The aortic valve is tricuspid. Aortic valve regurgitation is trivial.  Mild to moderate aortic valve sclerosis/calcification is present, without  any evidence of aortic stenosis.   5. Aortic dilatation noted. There is mild dilatation of the ascending  aorta, measuring 41 mm.  - Conclusion(s)/Recommendation(s): Findings consistent with hypertrophic  obstructive cardiomyopathy.     Past Medical History:  Diagnosis Date   Chronic lower back pain 06/11/2012   daily a problem Pain level 7, Tylenol helpful(down to level 2)   Depression    Due to med for his cancer - Lupron   H/O cardiovascular stress test    non ischemic EF 55% 05-15-2007   Heart murmur    dx as a teen, Stress ECHO 2008 (-)    Hypertension    Hypertrophic obstructive cardiomyopathy (HOCM) (HCC)    PAF (paroxysmal atrial fibrillation) (HCC)    Prostate cancer metastatic to intrapelvic lymph node (HCC)    gleason 4+3=7, volume 56.4 cc, PSA 34.25    Past Surgical History:  Procedure Laterality Date   ACHILLES  TENDON REPAIR     26 yrs ago, right foot   ANAL FISSURECTOMY  2008   dental procedure  06-11-12   minor -office procedure to correct jaw issue-used anesthesia gas to do   LYMPHADENECTOMY  06/17/2012   Procedure: LYMPHADENECTOMY;  Surgeon: [name], MD;  Location: [facility] ORS;  Service: Urology;  Laterality: Bilateral;   PROSTATE BIOPSY     ROBOT ASSISTED LAPAROSCOPIC RADICAL PROSTATECTOMY  06/17/2012   Procedure: ROBOTIC ASSISTED LAPAROSCOPIC RADICAL PROSTATECTOMY LEVEL 2;  Surgeon: [name], MD;  Location: [facility] ORS;  Service: Urology;  Laterality: N/A;       MEDICATIONS:  apixaban (ELIQUIS) 5 MG TABS tablet   Ascorbic Acid (VITAMIN C PO)   Cyanocobalamin (VITAMIN B 12 PO)   diltiazem (CARDIZEM CD) 240 MG 24 hr capsule   leuprolide (LUPRON) 30 MG injection   LORazepam (ATIVAN) 1 MG  tablet   meclizine (ANTIVERT) 25 MG tablet   Multiple Vitamin (MULTIVITAMIN WITH MINERALS) TABS tablet   Omega-3 Fatty Acids (FISH OIL PO)   rosuvastatin (CRESTOR) 20 MG tablet   No current facility-administered medications for this encounter.    Myra Gianotti, PA-C Surgical Short Stay/Anesthesiology [facility] Phone [phone] [facility] Phone [phone] 12/30/2020 1:25 PM

## 2020-12-30 NOTE — Anesthesia Preprocedure Evaluation (Addendum)
Anesthesia Evaluation  Patient identified by MRN, date of birth, ID band Patient awake    Reviewed: Allergy & Precautions, NPO status , Patient's Chart, lab work & pertinent test results  Airway Mallampati: III  TM Distance: >3 FB Neck ROM: Limited    Dental  (+) Teeth Intact   Pulmonary neg pulmonary ROS,    Pulmonary exam normal        Cardiovascular hypertension, Pt. on medications  Rhythm:Regular Rate:Normal  HOCM   Neuro/Psych Depression negative neurological ROS     GI/Hepatic negative GI ROS, Neg liver ROS,   Endo/Other  negative endocrine ROS  Renal/GU negative Renal ROS  negative genitourinary   Musculoskeletal  (+) Arthritis , Cervical stenosis   Abdominal (+)  Abdomen: soft. Bowel sounds: normal.  Peds  Hematology negative hematology ROS (+)   Anesthesia Other Findings   Reproductive/Obstetrics                           Anesthesia Physical Anesthesia Plan  ASA: 3  Anesthesia Plan: General   Post-op Pain Management:    Induction: Intravenous  PONV Risk Score and Plan: 2 and Ondansetron, Dexamethasone and Treatment may vary due to age or medical condition  Airway Management Planned: Mask and Oral ETT  Additional Equipment: Arterial line  Intra-op Plan:   Post-operative Plan: Extubation in OR  Informed Consent: I have reviewed the patients History and Physical, chart, labs and discussed the procedure including the risks, benefits and alternatives for the proposed anesthesia with the patient or authorized representative who has indicated his/her understanding and acceptance.     Dental advisory given  Plan Discussed with: CRNA  Anesthesia Plan Comments: (PAT note written 12/30/2020 by Myra Gianotti, PA-C. History of HOCM. Refuses blood products.   HOCM (As outlined by Dr. Audie Box) -23 mm septum(sigmoid septum variant) -LGE 2% -negative monitor for NSVT -no  syncope -no family history of SCD -LVOTO 70 mmHG-> 10 mmHG with treatment -ETT 06/08/2020 -> Normal BP response to exercise  Cardiology preoperative input outlined on 12/10/20 by Melina Copa, PA-C, "..given past medical history and time since last visit, based on ACC/AHA guidelines,Garnell D McCrawwould be at acceptable risk for the planned procedure without further cardiovascular testing.  EDITED TO ADD: Per Dr. Forde Radon status should be closely monitored during surgerybut otherwise no cardiac testing needed before procedure... Regarding anticoagulation, the pharmacy team reviewed his chart and recommends "Per office protocol, patient can holdEliquisfor 3days prior to procedure." )      Anesthesia Quick Evaluation

## 2021-01-03 ENCOUNTER — Other Ambulatory Visit: Payer: Self-pay | Admitting: Neurosurgery

## 2021-01-03 LAB — SARS CORONAVIRUS 2 (TAT 6-24 HRS): SARS Coronavirus 2: NEGATIVE

## 2021-01-05 ENCOUNTER — Other Ambulatory Visit: Payer: Self-pay

## 2021-01-05 ENCOUNTER — Encounter (HOSPITAL_COMMUNITY)
Admission: RE | Disposition: A | Discharge status: Home / Self Care | Payer: Self-pay | Source: Home / Self Care | Attending: Neurosurgery

## 2021-01-05 ENCOUNTER — Ambulatory Visit (HOSPITAL_COMMUNITY): Payer: Medicare HMO

## 2021-01-05 ENCOUNTER — Ambulatory Visit (HOSPITAL_COMMUNITY): Payer: Medicare HMO | Admitting: Certified Registered"

## 2021-01-05 ENCOUNTER — Encounter (HOSPITAL_COMMUNITY): Payer: Self-pay | Admitting: Neurosurgery

## 2021-01-05 ENCOUNTER — Ambulatory Visit (HOSPITAL_COMMUNITY): Payer: Medicare HMO | Admitting: Vascular Surgery

## 2021-01-05 ENCOUNTER — Observation Stay (HOSPITAL_COMMUNITY)
Admission: RE | Admit: 2021-01-05 | Discharge: 2021-01-08 | Disposition: A | Discharge status: Home / Self Care | Payer: Medicare HMO | Attending: Neurosurgery | Admitting: Neurosurgery

## 2021-01-05 DIAGNOSIS — M4722 Other spondylosis with radiculopathy, cervical region: Secondary | ICD-10-CM | POA: Insufficient documentation

## 2021-01-05 DIAGNOSIS — M4712 Other spondylosis with myelopathy, cervical region: Secondary | ICD-10-CM | POA: Diagnosis not present

## 2021-01-05 DIAGNOSIS — R42 Dizziness and giddiness: Secondary | ICD-10-CM | POA: Diagnosis not present

## 2021-01-05 DIAGNOSIS — I480 Paroxysmal atrial fibrillation: Secondary | ICD-10-CM | POA: Diagnosis not present

## 2021-01-05 DIAGNOSIS — M4322 Fusion of spine, cervical region: Secondary | ICD-10-CM | POA: Diagnosis not present

## 2021-01-05 DIAGNOSIS — Z981 Arthrodesis status: Secondary | ICD-10-CM | POA: Diagnosis not present

## 2021-01-05 DIAGNOSIS — M4802 Spinal stenosis, cervical region: Secondary | ICD-10-CM | POA: Diagnosis not present

## 2021-01-05 DIAGNOSIS — Z419 Encounter for procedure for purposes other than remedying health state, unspecified: Secondary | ICD-10-CM

## 2021-01-05 DIAGNOSIS — G992 Myelopathy in diseases classified elsewhere: Secondary | ICD-10-CM | POA: Diagnosis not present

## 2021-01-05 HISTORY — PX: ANTERIOR CERVICAL DECOMP/DISCECTOMY FUSION: SHX1161

## 2021-01-05 SURGERY — ANTERIOR CERVICAL DECOMPRESSION/DISCECTOMY FUSION 3 LEVELS
Anesthesia: General | Site: Spine Cervical

## 2021-01-05 MED ORDER — FLEET ENEMA 7-19 GM/118ML RE ENEM: 1.0000 | ENEMA | Freq: Once | RECTAL | Status: DC | PRN

## 2021-01-05 MED ORDER — CELECOXIB 200 MG PO CAPS
200.0000 mg | ORAL_CAPSULE | Freq: Two times a day (BID) | ORAL | Status: DC
  Administered 2021-01-05 – 2021-01-06 (×3): 200 mg via ORAL
  Filled 2021-01-05 (×3): qty 1

## 2021-01-05 MED ORDER — MIDAZOLAM HCL 2 MG/2ML IJ SOLN
INTRAMUSCULAR | Status: DC | PRN
  Administered 2021-01-05: 2 mg via INTRAVENOUS

## 2021-01-05 MED ORDER — CEFAZOLIN SODIUM-DEXTROSE 2-4 GM/100ML-% IV SOLN
2.0000 g | INTRAVENOUS | Status: AC
  Administered 2021-01-05 (×2): 2 g via INTRAVENOUS
  Filled 2021-01-05: qty 100

## 2021-01-05 MED ORDER — ACETAMINOPHEN 650 MG RE SUPP: 650.0000 mg | RECTAL | Status: DC | PRN

## 2021-01-05 MED ORDER — SUGAMMADEX SODIUM 200 MG/2ML IV SOLN
INTRAVENOUS | Status: DC | PRN
  Administered 2021-01-05: 400 mg via INTRAVENOUS

## 2021-01-05 MED ORDER — ADULT MULTIVITAMIN W/MINERALS CH
1.0000 | ORAL_TABLET | Freq: Every day | ORAL | Status: DC
  Administered 2021-01-05 – 2021-01-06 (×2): 1 via ORAL
  Filled 2021-01-05 (×2): qty 1

## 2021-01-05 MED ORDER — METOPROLOL TARTRATE 5 MG/5ML IV SOLN
2.5000 mg | Freq: Once | INTRAVENOUS | Status: AC
  Administered 2021-01-05: 2.5 mg via INTRAVENOUS

## 2021-01-05 MED ORDER — PHENOL 1.4 % MT LIQD: 1.0000 | OROMUCOSAL | Status: DC | PRN

## 2021-01-05 MED ORDER — ACETAMINOPHEN 10 MG/ML IV SOLN
INTRAVENOUS | Status: AC
  Filled 2021-01-05: qty 100

## 2021-01-05 MED ORDER — OXYCODONE HCL 5 MG PO TABS
10.0000 mg | ORAL_TABLET | ORAL | Status: DC | PRN
  Administered 2021-01-05: 10 mg via ORAL
  Filled 2021-01-05: qty 2

## 2021-01-05 MED ORDER — LIDOCAINE-EPINEPHRINE 0.5 %-1:200000 IJ SOLN
INTRAMUSCULAR | Status: DC | PRN
  Administered 2021-01-05: 4 mL

## 2021-01-05 MED ORDER — METOPROLOL TARTRATE 5 MG/5ML IV SOLN
INTRAVENOUS | Status: AC
  Filled 2021-01-05: qty 5

## 2021-01-05 MED ORDER — THROMBIN 5000 UNITS EX SOLR
CUTANEOUS | Status: AC
  Filled 2021-01-05: qty 5000

## 2021-01-05 MED ORDER — MECLIZINE HCL 25 MG PO TABS
25.0000 mg | ORAL_TABLET | Freq: Three times a day (TID) | ORAL | Status: DC | PRN
  Filled 2021-01-05: qty 1

## 2021-01-05 MED ORDER — PROPOFOL 10 MG/ML IV BOLUS
INTRAVENOUS | Status: AC
  Filled 2021-01-05: qty 20

## 2021-01-05 MED ORDER — PHENYLEPHRINE HCL-NACL 20-0.9 MG/250ML-% IV SOLN
INTRAVENOUS | Status: DC | PRN
Start: 2021-01-05 — End: 2021-01-05
  Administered 2021-01-05: 20 ug/min via INTRAVENOUS

## 2021-01-05 MED ORDER — ORAL CARE MOUTH RINSE: 15.0000 mL | Freq: Once | OROMUCOSAL | Status: AC

## 2021-01-05 MED ORDER — CHLORHEXIDINE GLUCONATE 0.12 % MT SOLN
15.0000 mL | Freq: Once | OROMUCOSAL | Status: AC
  Administered 2021-01-05: 15 mL via OROMUCOSAL
  Filled 2021-01-05: qty 15

## 2021-01-05 MED ORDER — SODIUM CHLORIDE 0.9 % IV SOLN: 250.0000 mL | INTRAVENOUS | Status: DC

## 2021-01-05 MED ORDER — DEXAMETHASONE SODIUM PHOSPHATE 10 MG/ML IJ SOLN
INTRAMUSCULAR | Status: DC | PRN
  Administered 2021-01-05: 10 mg via INTRAVENOUS

## 2021-01-05 MED ORDER — ROSUVASTATIN CALCIUM 20 MG PO TABS
20.0000 mg | ORAL_TABLET | Freq: Every day | ORAL | Status: DC
  Administered 2021-01-05 – 2021-01-06 (×2): 20 mg via ORAL
  Filled 2021-01-05 (×2): qty 1

## 2021-01-05 MED ORDER — PROPOFOL 10 MG/ML IV BOLUS
INTRAVENOUS | Status: DC | PRN
  Administered 2021-01-05: 30 mg via INTRAVENOUS
  Administered 2021-01-05: 150 mg via INTRAVENOUS

## 2021-01-05 MED ORDER — THROMBIN 20000 UNITS EX SOLR
CUTANEOUS | Status: AC
  Filled 2021-01-05: qty 20000

## 2021-01-05 MED ORDER — CHLORHEXIDINE GLUCONATE CLOTH 2 % EX PADS: 6.0000 | MEDICATED_PAD | Freq: Once | CUTANEOUS | Status: DC

## 2021-01-05 MED ORDER — PROMETHAZINE HCL 25 MG/ML IJ SOLN: 6.2500 mg | INTRAMUSCULAR | Status: DC | PRN

## 2021-01-05 MED ORDER — SODIUM CHLORIDE 0.9% FLUSH: 3.0000 mL | INTRAVENOUS | Status: DC | PRN

## 2021-01-05 MED ORDER — ASCORBIC ACID 500 MG PO TABS
500.0000 mg | ORAL_TABLET | Freq: Two times a day (BID) | ORAL | Status: DC
  Administered 2021-01-05 – 2021-01-06 (×3): 500 mg via ORAL
  Filled 2021-01-05 (×3): qty 1

## 2021-01-05 MED ORDER — MORPHINE SULFATE (PF) 2 MG/ML IV SOLN: 2.0000 mg | INTRAVENOUS | Status: DC | PRN

## 2021-01-05 MED ORDER — ACETAMINOPHEN 325 MG PO TABS
650.0000 mg | ORAL_TABLET | ORAL | Status: DC | PRN
  Administered 2021-01-06 – 2021-01-07 (×2): 650 mg via ORAL
  Filled 2021-01-05 (×2): qty 2

## 2021-01-05 MED ORDER — METOPROLOL TARTRATE 5 MG/5ML IV SOLN: 5.0000 mg | Freq: Once | INTRAVENOUS | Status: DC

## 2021-01-05 MED ORDER — OXYCODONE HCL ER 10 MG PO T12A
10.0000 mg | EXTENDED_RELEASE_TABLET | Freq: Two times a day (BID) | ORAL | Status: DC
  Administered 2021-01-05: 10 mg via ORAL
  Filled 2021-01-05: qty 1

## 2021-01-05 MED ORDER — POTASSIUM CHLORIDE IN NACL 20-0.9 MEQ/L-% IV SOLN: INTRAVENOUS | Status: DC

## 2021-01-05 MED ORDER — SENNOSIDES-DOCUSATE SODIUM 8.6-50 MG PO TABS: 1.0000 | ORAL_TABLET | Freq: Every evening | ORAL | Status: DC | PRN

## 2021-01-05 MED ORDER — FENTANYL CITRATE (PF) 100 MCG/2ML IJ SOLN
INTRAMUSCULAR | Status: DC | PRN
  Administered 2021-01-05 (×5): 50 ug via INTRAVENOUS

## 2021-01-05 MED ORDER — VITAMIN B-12 1000 MCG PO TABS: 500.0000 ug | ORAL_TABLET | ORAL | Status: DC

## 2021-01-05 MED ORDER — ONDANSETRON HCL 4 MG/2ML IJ SOLN
4.0000 mg | Freq: Four times a day (QID) | INTRAMUSCULAR | Status: DC | PRN
  Administered 2021-01-06: 4 mg via INTRAVENOUS
  Filled 2021-01-05: qty 2

## 2021-01-05 MED ORDER — DIAZEPAM 5 MG PO TABS: 5.0000 mg | ORAL_TABLET | Freq: Four times a day (QID) | ORAL | Status: DC | PRN

## 2021-01-05 MED ORDER — FENTANYL CITRATE (PF) 100 MCG/2ML IJ SOLN: 25.0000 ug | INTRAMUSCULAR | Status: DC | PRN

## 2021-01-05 MED ORDER — SENNA 8.6 MG PO TABS
1.0000 | ORAL_TABLET | Freq: Two times a day (BID) | ORAL | Status: DC
  Administered 2021-01-05 – 2021-01-06 (×3): 8.6 mg via ORAL
  Filled 2021-01-05 (×3): qty 1

## 2021-01-05 MED ORDER — DILTIAZEM HCL ER COATED BEADS 120 MG PO CP24
240.0000 mg | ORAL_CAPSULE | Freq: Every day | ORAL | Status: DC
  Administered 2021-01-05 – 2021-01-06 (×2): 240 mg via ORAL
  Filled 2021-01-05 (×2): qty 2

## 2021-01-05 MED ORDER — LIDOCAINE 2% (20 MG/ML) 5 ML SYRINGE
INTRAMUSCULAR | Status: DC | PRN
  Administered 2021-01-05: 80 mg via INTRAVENOUS

## 2021-01-05 MED ORDER — LIDOCAINE-EPINEPHRINE 0.5 %-1:200000 IJ SOLN
INTRAMUSCULAR | Status: AC
  Filled 2021-01-05: qty 1

## 2021-01-05 MED ORDER — ONDANSETRON HCL 4 MG/2ML IJ SOLN
INTRAMUSCULAR | Status: DC | PRN
  Administered 2021-01-05: 4 mg via INTRAVENOUS

## 2021-01-05 MED ORDER — ONDANSETRON HCL 4 MG PO TABS: 4.0000 mg | ORAL_TABLET | Freq: Four times a day (QID) | ORAL | Status: DC | PRN

## 2021-01-05 MED ORDER — MIDAZOLAM HCL 2 MG/2ML IJ SOLN
INTRAMUSCULAR | Status: AC
  Filled 2021-01-05: qty 2

## 2021-01-05 MED ORDER — SODIUM CHLORIDE 0.9% FLUSH
3.0000 mL | Freq: Two times a day (BID) | INTRAVENOUS | Status: DC
  Administered 2021-01-05 – 2021-01-06 (×3): 3 mL via INTRAVENOUS

## 2021-01-05 MED ORDER — 0.9 % SODIUM CHLORIDE (POUR BTL) OPTIME
TOPICAL | Status: DC | PRN
  Administered 2021-01-05: 1000 mL

## 2021-01-05 MED ORDER — MENTHOL 3 MG MT LOZG
1.0000 | LOZENGE | OROMUCOSAL | Status: DC | PRN
  Filled 2021-01-05: qty 9

## 2021-01-05 MED ORDER — OXYCODONE HCL 5 MG PO TABS
5.0000 mg | ORAL_TABLET | ORAL | Status: DC | PRN
  Administered 2021-01-05 – 2021-01-06 (×2): 5 mg via ORAL
  Filled 2021-01-05 (×2): qty 1

## 2021-01-05 MED ORDER — DEXAMETHASONE 4 MG PO TABS
4.0000 mg | ORAL_TABLET | Freq: Four times a day (QID) | ORAL | Status: DC
  Administered 2021-01-05 – 2021-01-07 (×6): 4 mg via ORAL
  Filled 2021-01-05 (×6): qty 1

## 2021-01-05 MED ORDER — THROMBIN 20000 UNITS EX SOLR: CUTANEOUS | Status: DC | PRN

## 2021-01-05 MED ORDER — BISACODYL 5 MG PO TBEC: 5.0000 mg | DELAYED_RELEASE_TABLET | Freq: Every day | ORAL | Status: DC | PRN

## 2021-01-05 MED ORDER — ACETAMINOPHEN 10 MG/ML IV SOLN: 1000.0000 mg | Freq: Once | INTRAVENOUS | Status: DC | PRN

## 2021-01-05 MED ORDER — ACETAMINOPHEN 10 MG/ML IV SOLN
1000.0000 mg | Freq: Once | INTRAVENOUS | Status: AC
  Administered 2021-01-05: 1000 mg via INTRAVENOUS

## 2021-01-05 MED ORDER — LACTATED RINGERS IV SOLN: INTRAVENOUS | Status: DC

## 2021-01-05 MED ORDER — ROCURONIUM BROMIDE 10 MG/ML (PF) SYRINGE
PREFILLED_SYRINGE | INTRAVENOUS | Status: DC | PRN
  Administered 2021-01-05: 30 mg via INTRAVENOUS
  Administered 2021-01-05: 20 mg via INTRAVENOUS
  Administered 2021-01-05: 60 mg via INTRAVENOUS
  Administered 2021-01-05: 30 mg via INTRAVENOUS

## 2021-01-05 MED ORDER — FENTANYL CITRATE (PF) 250 MCG/5ML IJ SOLN
INTRAMUSCULAR | Status: AC
  Filled 2021-01-05: qty 5

## 2021-01-05 MED ORDER — THROMBIN 5000 UNITS EX SOLR: OROMUCOSAL | Status: DC | PRN

## 2021-01-05 MED ORDER — DEXAMETHASONE SODIUM PHOSPHATE 4 MG/ML IJ SOLN
4.0000 mg | Freq: Four times a day (QID) | INTRAMUSCULAR | Status: DC
  Administered 2021-01-05 – 2021-01-06 (×2): 4 mg via INTRAVENOUS
  Filled 2021-01-05 (×2): qty 1

## 2021-01-05 SURGICAL SUPPLY — 50 items
ADH SKN CLS APL DERMABOND .7 (GAUZE/BANDAGES/DRESSINGS) ×1
BAG COUNTER SPONGE SURGICOUNT (BAG) ×4 IMPLANT
BAG SPNG CNTER NS LX DISP (BAG) ×2
BAND INSRT 18 STRL LF DISP RB (MISCELLANEOUS) ×2
BAND RUBBER #18 3X1/16 STRL (MISCELLANEOUS) ×4 IMPLANT
BASKET BONE COLLECTION (BASKET) ×2 IMPLANT
BUR DRUM 4.0 (BURR) ×2 IMPLANT
BUR MATCHSTICK NEURO 3.0 LAGG (BURR) ×2 IMPLANT
CAGE SOLID PEEK 12X14X22 7D (Cage) ×2 IMPLANT
CANISTER SUCT 3000ML PPV (MISCELLANEOUS) ×2 IMPLANT
CARTRIDGE OIL MAESTRO DRILL (MISCELLANEOUS) ×1 IMPLANT
DERMABOND ADVANCED (GAUZE/BANDAGES/DRESSINGS) ×1
DERMABOND ADVANCED .7 DNX12 (GAUZE/BANDAGES/DRESSINGS) ×1 IMPLANT
DIFFUSER DRILL AIR PNEUMATIC (MISCELLANEOUS) ×2 IMPLANT
DRAPE HALF SHEET 40X57 (DRAPES) ×2 IMPLANT
DRAPE LAPAROTOMY 100X72 PEDS (DRAPES) ×2 IMPLANT
DRAPE MICROSCOPE LEICA (MISCELLANEOUS) ×2 IMPLANT
DURAPREP 6ML APPLICATOR 50/CS (WOUND CARE) ×2 IMPLANT
ELECT COATED BLADE 2.86 ST (ELECTRODE) ×2 IMPLANT
ELECT REM PT RETURN 9FT ADLT (ELECTROSURGICAL) ×2
ELECTRODE REM PT RTRN 9FT ADLT (ELECTROSURGICAL) ×1 IMPLANT
GAUZE 4X4 16PLY ~~LOC~~+RFID DBL (SPONGE) IMPLANT
GLOVE SURG LTX SZ6.5 (GLOVE) ×2 IMPLANT
GOWN STRL REUS W/ TWL LRG LVL3 (GOWN DISPOSABLE) ×2 IMPLANT
GOWN STRL REUS W/ TWL XL LVL3 (GOWN DISPOSABLE) ×2 IMPLANT
GOWN STRL REUS W/TWL 2XL LVL3 (GOWN DISPOSABLE) IMPLANT
GOWN STRL REUS W/TWL LRG LVL3 (GOWN DISPOSABLE) ×4
GOWN STRL REUS W/TWL XL LVL3 (GOWN DISPOSABLE) ×4
HEMOSTAT POWDER KIT SURGIFOAM (HEMOSTASIS) ×2 IMPLANT
KIT BASIN OR (CUSTOM PROCEDURE TRAY) ×2 IMPLANT
KIT TURNOVER KIT B (KITS) ×2 IMPLANT
NEEDLE HYPO 25X1 1.5 SAFETY (NEEDLE) ×2 IMPLANT
NEEDLE SPNL 22GX3.5 QUINCKE BK (NEEDLE) ×4 IMPLANT
NS IRRIG 1000ML POUR BTL (IV SOLUTION) ×2 IMPLANT
OIL CARTRIDGE MAESTRO DRILL (MISCELLANEOUS) ×2
PACK LAMINECTOMY NEURO (CUSTOM PROCEDURE TRAY) ×2 IMPLANT
PATTIES SURGICAL 1X1 (DISPOSABLE) ×2 IMPLANT
PIN DISTRACTION 14MM (PIN) ×2 IMPLANT
PLATE ACP 1.6X36 2LVL (Plate) ×2 IMPLANT
SCREW ACP 3.5 X 13 S/D VARIA (Screw) ×6 IMPLANT
SCREW ACP 3.5X13 S/D VAR ANGLE (Screw) ×3 IMPLANT
SCREW ACP ST VARI 3.5X13 (Screw) ×2 IMPLANT
SPONGE INTESTINAL PEANUT (DISPOSABLE) ×4 IMPLANT
SPONGE SURGIFOAM ABS GEL 100 (HEMOSTASIS) ×2 IMPLANT
SUT VIC AB 0 CT1 27 (SUTURE) ×2
SUT VIC AB 0 CT1 27XBRD ANTBC (SUTURE) ×1 IMPLANT
SUT VIC AB 3-0 SH 8-18 (SUTURE) ×2 IMPLANT
TOWEL GREEN STERILE (TOWEL DISPOSABLE) ×2 IMPLANT
TOWEL GREEN STERILE FF (TOWEL DISPOSABLE) ×2 IMPLANT
WATER STERILE IRR 1000ML POUR (IV SOLUTION) ×2 IMPLANT

## 2021-01-05 NOTE — Anesthesia Procedure Notes (Signed)
Procedure Name: Intubation Date/Time: 01/05/2021 9:17 AM Performed by: Genelle Bal, CRNA Pre-anesthesia Checklist: Patient identified, Emergency Drugs available, Suction available and Patient being monitored Patient Re-evaluated:Patient Re-evaluated prior to induction Oxygen Delivery Method: Circle system utilized Preoxygenation: Pre-oxygenation with 100% oxygen Induction Type: IV induction Ventilation: Oral airway inserted - appropriate to patient size and Mask ventilation with difficulty Laryngoscope Size: Mac and 4 Grade View: Grade I Tube type: Oral Tube size: 7.5 mm Number of attempts: 1 Airway Equipment and Method: Stylet and Oral airway Placement Confirmation: ETT inserted through vocal cords under direct vision, positive ETCO2 and breath sounds checked- equal and bilateral Secured at: 24 cm Tube secured with: Tape Dental Injury: Teeth and Oropharynx as per pre-operative assessment  Comments: Anterior pressure

## 2021-01-05 NOTE — Op Note (Signed)
01/05/2021  9:07 PM  PATIENT:  David Smith  65 y.o. male With cervical myelopathy due to cervical stenosis at C2/3,3/4 PRE-OPERATIVE DIAGNOSIS:  Spinal stenosis of cervical region C2/3,3/4  POST-OPERATIVE DIAGNOSIS:  Spinal stenosis of cervical region C2/3, 3/4  PROCEDURE:  Anterior Cervical corpectomy C3 Arthrodesis C2-4 with 60m Peek strut filled with autograft morsels Anterior instrumentation(Nuvasive) C2-4  SURGEON:   Surgeon(s): CAshok Pall MD JNewman Pies MD   ASSISTANTS:Jenkins, JDellis Filbert ANESTHESIA:   general  EBL:  No intake/output data recorded.  BLOOD ADMINISTERED:none  CELL SAVER GIVEN:none  COUNT:per nursing  DRAINS: none   SPECIMEN:  No Specimen  DICTATION: [name] taken to the operating room, intubated, and placed under general anesthesia without difficulty. He was positioned supine with his head in slight extension on a horseshoe headrest. The neck was prepped and draped in a sterile manner. I infiltrated 6 cc's 1/2%lidocaine/1:200,000 strength epinephrine into the planned incision starting from the midline to the medial border of the left sternocleidomastoid muscle. I opened the incision with a 10 blade and dissected sharply through soft tissue to the platysma. I dissected in the plane superior to the platysma both rostrally and caudally. I then opened the platysma in a horizontal fashion with Metzenbaum scissors, and dissected in the inferior plane rostrally and caudally. With both blunt and sharp technique I created an avascular corridor to the cervical spine. I placed a spinal needle(s) in the disc space at C3/4 . I then reflected the longus colli from C2 to C4 and placed self retaining retractors. I opened the disc space(s) at 2/3, 3/4 with a 15 blade. I removed disc with curettes, Kerrison punches, and the drill. Using the drill I removed osteophytes and prepared for the decompression.  We decompressed the spinal canal and the C3, and 4  root(s) with the drill, Kerrison punches, and the curettes. I used the microscope to aid in microdissection.  We performed a corpectomy with the drill and rongeurs. The canal was well decompressed. The ligament was quite adherent to the dura and compressive on the cord. With the decompression complete I moved on to the arthrodesis. We used the drill to level the surfaces of C2, and 3. We removed soft tissue to prepare the disc space and the bony surfaces.We measured the space and placed a 247mPeek strut between the C2 and 4 vertebral bodies I then placed the anterior instrumentation. I placed 2 screws in each vertebral body through the plate. I locked the screws into place. Intraoperative xray showed the graft, plate, and screws to be in good position. I irrigated the wound, achieved hemostasis, and closed the wound in layers. I approximated the platysma, and the subcuticular plane with vicryl sutures. I used Dermabond for a sterile dressing.   PLAN OF CARE: Admit to inpatient   PATIENT DISPOSITION:  PACU - hemodynamically stable.   Delay start of Pharmacological VTE agent (>24hrs) due to surgical blood loss or risk of bleeding:  yes

## 2021-01-05 NOTE — Anesthesia Postprocedure Evaluation (Signed)
Anesthesia Post Note  Patient: David Smith  Procedure(s) Performed: Cervical Three Corpectomy (Spine Cervical)     Patient location during evaluation: PACU Anesthesia Type: General Level of consciousness: awake and alert Pain management: pain level controlled Vital Signs Assessment: post-procedure vital signs reviewed and stable Respiratory status: spontaneous breathing, nonlabored ventilation, respiratory function stable and patient connected to nasal cannula oxygen Cardiovascular status: blood pressure returned to baseline and stable Postop Assessment: no apparent nausea or vomiting Anesthetic complications: no   No notable events documented.  Last Vitals:  Vitals:   01/05/21 1521 01/05/21 1537  BP: (!) 108/51 116/69  Pulse: (!) 56 (!) 58  Resp:  18  Temp:  36.5 C  SpO2: 95% 100%    Last Pain:  Vitals:   01/05/21 1537  TempSrc: Oral  PainSc:                  March Rummage Amberrose Friebel

## 2021-01-05 NOTE — Transfer of Care (Signed)
Immediate Anesthesia Transfer of Care Note  Patient: ALEXIOS BORTON  Procedure(s) Performed: Cervical Three Corpectomy (Spine Cervical)  Patient Location: PACU  Anesthesia Type:General  Level of Consciousness: awake, alert  and oriented  Airway & Oxygen Therapy: Patient Spontanous Breathing and Patient connected to face mask oxygen  Post-op Assessment: Report given to RN and Post -op Vital signs reviewed and stable  Post vital signs: Reviewed and stable  Last Vitals:  Vitals Value Taken Time  BP 138/106 01/05/21 1338  Temp    Pulse 104 01/05/21 1341  Resp 17 01/05/21 1341  SpO2 99 % 01/05/21 1341  Vitals shown include unvalidated device data.  Last Pain:  Vitals:   01/05/21 0648  TempSrc: Oral         Complications: No notable events documented.

## 2021-01-05 NOTE — H&P (Signed)
BP (!) 165/79   Pulse 71   Temp 98.7 F (37.1 C) (Oral)   Resp 18   Ht '6\' 1"'$  (1.854 m)   Wt 118.9 kg   SpO2 98%   BMI 34.58 kg/m   David Smith is a gentleman whom I had last seen in May of 2017.  At that time, he had fairly profound stenosis at C2-3, 3-4 secondary to an outside posterior longitudinal ligament and a large bone spur posteriorly.  The cord had abnormal signal.  He clearly was myelopathic at that time, and I strongly recommended that he undergo operative decompression, which I planned at that time to do from a posterior approach.  David Smith, however, disappeared from the practice and he returns today.  He, then in 2017 and today, complains of vertigo and dizziness.  He again restates that he has no cervical pain, that he has no pain in his arms, he does still have some pain in his lower back.  He states that he was seen by a neurologist who felt that he should be referred back to me because a recent MRI done in April of this year showed at the top of the cervical spine the cord compression.  No dedicated study was ordered of the cervical spine, which I would certainly need, and he was never evaluated for the vertigo.  He states that the tests from 5 years ago were assessed, and the referring physician felt it was best that he see me for the cervical cord compression.  I explained to David Smith that certainly his balance and gait would absolutely be affected by the cord compression, but that the cord compression was not in any way causing vertigo.     David Smith has had no bowel or bladder dysfunction.  He does have a history of prostate cancer, which has been treated both by surgery, Lupron, and radiation.  He states that he was scared to get the operation when I initially saw him, because he was told he did not have long to live secondary to the prostate cancer.  Obviously, that prediction was wrong and he happily is quite alive today.  He walks with a cane and his balance is quite  poor.     PHYSICAL EXAMINATION :  He is alert, oriented by 4.  He answers all questions appropriately.  Memory, language, attention span, and fund of knowledge are normal.  Pupils equal, round, and reactive to light.  No nystagmus is noted.  Symmetric facial movements and sensation.  He has 5/5 strength in the upper and lower extremities.  He has on a button shirt, so his dexterity remains good enough.  He had 1+ reflex at the right knee, 2-3 at the left knee, 2+ reflexes in the upper extremities.  He had no clonus.  Mild Hoffmann sign bilaterally.  Romberg test was surprisingly good.  He weighs 261 pounds.  Temperature is 97.8, blood pressure 139/77, pulse 68.  He has no pain.  David Smith returns today with a new MRI of his cervical spine, where he has a severe and actually critical stenosis behind the body of C3, so extending from the C2-3 level to C3-4.  He has ossification of the posterior longitudinal ligament.  He has spinal cord signal at that level.  He remains spastic.  He is myelopathic.  He continues to walk with his cane.  Once more, 5 years ago, I had recommended this and David Smith, because it was not causing the  vertigo, which he was sent to me for, just did want anything to do with it.  He is hesitant and I do understand it is a big operation and I explained that he certainly could be worse, he could be paralyzed, but I also explained that he will be worse and will be paralyzed if he does absolutely nothing.  He weighs 262 pounds.  Temperature is 98.5, blood pressure 145/87, pulse 69.     ASSESSMENT AND PLAN :  We discussed, for well over 15 minutes, an operation which would be a C3 corpectomy.  The fact that he gets we do not have to extend down to, though 4-5 certainly has spinal stenosis.  We really just want to take care of the worst areas possible.  He was leading an active life by his account and would like to get back to it.  There are other levels which also are affected by the  ossification of OPLL, but we cannot give him a new neck, as of now, so we will absolutely take care of the worst level and then we will have to assess in the future what is or is not going on.

## 2021-01-05 NOTE — Anesthesia Procedure Notes (Signed)
Arterial Line Insertion Start/End8/08/2020 8:26 AM, 01/05/2021 8:26 AM Performed by: Darral Dash, DO, CRNA  Patient location: Pre-op. Preanesthetic checklist: patient identified, IV checked, site marked, risks and benefits discussed, surgical consent, monitors and equipment checked, pre-op evaluation, timeout performed and anesthesia consent Lidocaine 1% used for infiltration Left, radial was placed Catheter size: 20 G Hand hygiene performed  and maximum sterile barriers used   Attempts: 2 Procedure performed without using ultrasound guided technique. Following insertion, dressing applied. Post procedure assessment: normal and unchanged  Patient tolerated the procedure well with no immediate complications.

## 2021-01-05 NOTE — Progress Notes (Signed)
Pt arrived to PACU from [place] at 1336. Pt was drowsy d/t anesthesia but oriented to place. He was able to move his left upper and lower extremities. He was unable to move his right upper and lower extremities but reported full sensation. Remainder of neuro exam unremarkable. Dr. Christella Noa, MD, was made aware at 1337 and assessed the patient at the bedside. RN was instructed to continue to monitor the patient's movements. At 1435, the patient had regained some movement to his RLE. He was able to weakly dorsiflex and plantar flex and reported full sensation. He was still unable to grip with his right hand and could only move his RUE with gravity eliminated. He continued to report full sensation of all extremities. No other neurological changes. Surgical site unremarkable. Dr. Christella Noa was notified and stated that the patient was okay to move to the floor and to instruct the floor nurse to continue to monitor the patient's movements and notify if no improvement or worsening occurs.

## 2021-01-06 ENCOUNTER — Encounter (HOSPITAL_COMMUNITY): Payer: Self-pay | Admitting: Neurosurgery

## 2021-01-06 DIAGNOSIS — M4712 Other spondylosis with myelopathy, cervical region: Secondary | ICD-10-CM | POA: Diagnosis not present

## 2021-01-06 MED ORDER — HYDROXYZINE HCL 50 MG/ML IM SOLN: 50.0000 mg | Freq: Four times a day (QID) | INTRAMUSCULAR | Status: DC | PRN

## 2021-01-06 NOTE — Evaluation (Signed)
Occupational Therapy Evaluation Patient Details Name: David Smith MRN: [id number] DOB: [birth year] Today's Date: 01/06/2021    History of Present Illness 65 y.o. male diagnosed with cervical myelopathy due to cervical stenosis at C2/3,3/4.  Underwent Anterior Cervical corpectomy C3  Arthrodesis C2-4 with 28m Peek strut filled with autograft morsels  Anterior instrumentation.  PMH includes prostate CA and Vertigo.   Clinical Impression   Patient admitted for the above diagnosis and procedure.  PTA he lives with his spouse, who can provide assist as needed.  He used a cane with a seat for mobility, but he could complete his own ADL/IADL, and continues to drive.  Barriers are listed below.  [place] be [place].  Patient became nausea and vomited.  Plan will be to return home when cleared medically.  HH OT is being recommended for now, but he may progress, and not need any HH OT.  Continue OT in the acute setting.      Follow Up Recommendations  Home health OT    Equipment Recommendations  3 in 1 bedside commode    Recommendations for Other Services       Precautions / Restrictions Precautions Precautions: Cervical Precaution Booklet Issued: Yes (comment) Precaution Comments: reviewed with good follow up Restrictions Weight Bearing Restrictions: No Other Position/Activity Restrictions: R hemi weakness      Mobility Bed Mobility Overal bed mobility: Needs Assistance Bed Mobility: Rolling;Sidelying to Sit;Sit to Sidelying Rolling: Supervision Sidelying to sit: Min assist     Sit to sidelying: Min assist General bed mobility comments: assist with R foot on bed and to elevate trunk due to R arm weakness Patient Response: Cooperative  Transfers Overall transfer level: Needs assistance   Transfers: Sit to/from Stand Sit to Stand: Min assist         General transfer comment: able to take two stpes forward  and back with minimal blocking to R knee    Balance Overall balance assessment: Needs assistance Sitting-balance support: Feet supported Sitting balance-Leahy Scale: Fair     Standing balance support: Bilateral upper extremity supported Standing balance-Leahy Scale: Poor Standing balance comment: needing RW for external support                           ADL either performed or assessed with clinical judgement   ADL                                         General ADL Comments: To be assessed: R UE and LE weakness impact independence.     Vision Baseline Vision/History: Wears glasses Wears Glasses: At all times Patient Visual Report: No change from baseline       Perception     Praxis      Pertinent Vitals/Pain Pain Assessment: Faces Faces Pain Scale: Hurts a little bit Pain Location: Anterior neck Pain Descriptors / Indicators: Operative site guarding Pain Intervention(s): Monitored during session     Hand Dominance Right   Extremity/Trunk Assessment Upper Extremity Assessment Upper Extremity Assessment: RUE deficits/detail RUE Deficits / Details: generalized weakness which is improving RUE Sensation: WNL RUE Coordination: decreased fine motor;decreased gross motor   Lower Extremity Assessment Lower Extremity Assessment: Defer to PT evaluation   Cervical / Trunk Assessment Cervical / Trunk Assessment: Other exceptions  Cervical / Trunk Exceptions: c spine surgery   Communication Communication Communication: No difficulties   Cognition Arousal/Alertness: Awake/alert Behavior During Therapy: WFL for tasks assessed/performed Overall Cognitive Status: Within Functional Limits for tasks assessed                                                      Home Living Family/patient expects to be discharged to:: Private residence Living Arrangements: Spouse/significant other;Children Available Help at Discharge:  Family;Available 24 hours/day Type of Home: House Home Access: Stairs to enter [employer] of Steps: 3 Entrance Stairs-Rails: Left;Right;Can reach both Home Layout: Two level;Able to live on main level with bedroom/bathroom     Bathroom Shower/Tub: Teacher, early years/pre: Standard Bathroom Accessibility: Yes How Accessible: Accessible via walker Home Equipment: Cane - quad;Tub bench          Prior Functioning/Environment Level of Independence: Independent with assistive device(s)        Comments: Uses a cane with a seat for mobility.  Still drives, independent with ADL and IADL        OT Problem List: Decreased strength;Decreased activity tolerance;Impaired balance (sitting and/or standing);Impaired UE functional use      OT Treatment/Interventions: Self-care/ADL training;Therapeutic exercise;DME and/or AE instruction;Balance training;Patient/family education;Therapeutic activities    OT Goals(Current goals can be found in the care plan section) Acute Rehab OT Goals Patient Stated Goal: patient hoping to recover and regain function OT Goal Formulation: With patient Time For Goal Achievement: 01/20/21 Potential to Achieve Goals: Good ADL Goals Pt Will Perform Lower Body Bathing: with modified independence;sit to/from stand Pt Will Perform Lower Body Dressing: with modified independence;sit to/from stand Pt Will Transfer to Toilet: with modified independence;ambulating;bedside commode  OT Frequency: Min 2X/week   Barriers to D/C:    none noted       Co-evaluation              AM-PAC OT "6 Clicks" Daily Activity     Outcome Measure Help from another person eating meals?: None Help from another person taking care of personal grooming?: None Help from another person toileting, which includes using toliet, bedpan, or urinal?: A Little Help from another person bathing (including washing, rinsing, drying)?: A Little Help from another person  to put on and taking off regular upper body clothing?: A Little Help from another person to put on and taking off regular lower body clothing?: A Little 6 Click Score: 20   End of Session Equipment Utilized During Treatment: Rolling walker;Gait belt Nurse Communication: Mobility status  Activity Tolerance: Patient tolerated treatment well Patient left: in bed;with call bell/phone within reach;with nursing/sitter in room  OT Visit Diagnosis: Unsteadiness on feet (R26.81);Other abnormalities of gait and mobility (R26.89);Muscle weakness (generalized) (M62.81)                Time: [id number] OT Time Calculation (min): 29 min Charges:  OT General Charges $OT Visit: 1 Visit OT Evaluation $OT Eval Moderate Complexity: 1 Mod OT Treatments $Therapeutic Activity: 8-22 mins  01/06/2021  Rich, OTR/L  Acute Rehabilitation Services  Office:  [place] 01/06/2021, 9:44 AM

## 2021-01-06 NOTE — Progress Notes (Signed)
Rehab Admissions Coordinator Note:  Patient was screened by Cleatrice Burke for appropriateness for an Inpatient Acute Rehab Consult per therapy change in recommendation. Noted patient under observation status. If patient felt to be inpatient status, we could pursue a possible CIR admit with [facility] I await Dr Christella Noa to clarify. Please advise.  Cleatrice Burke RN MSN 01/06/2021, 11:51 AM  I can be reached at [phone].

## 2021-01-06 NOTE — Progress Notes (Signed)
Patient ID: David Smith, male   DOB: May 16, 1956, 65 y.o.   MRN: [id number] BP 138/72 (BP Location: Right Arm)   Pulse 62   Temp 98.8 F (37.1 C) (Oral)   Resp 18   Ht '6\' 1"'$  (1.854 m)   Wt 118.9 kg   SpO2 95%   BMI 34.58 kg/m  Alert and oriented x 4, speech is clear and fluent Remains weak in right upper and lower extremity, but is significantly better than immediately post op. He did walk today using a walker and assistance. He is able to open and close his hands, again much better movement now than last evening.  Wound is clean, and dry. Continue rehab and therapies

## 2021-01-06 NOTE — Care Management Obs Status (Signed)
[facility] NOTIFICATION   Patient Details  Name: David Smith MRN: [id number] Date of Birth: [birth year]   Medicare Observation Status Notification Given:  Yes    Joanne Chars, LCSW 01/06/2021, 3:15 PM

## 2021-01-06 NOTE — Evaluation (Signed)
Physical Therapy Evaluation Patient Details Name: David Smith MRN: [id number] DOB: November 07, 1955 Today's Date: 01/06/2021   History of Present Illness  Pt is a 65 y/o male diagnosed with cervical myelopathy due to cervical stenosis at C2/3,3/4.  Underwent Anterior Cervical corpectomy C3  Arthrodesis C2-4 on 01/05/2021.  PMH significant for prostate CA, HTN, hypertrophic obstructive cardiomyopathy, EF 55% 2008, Vertigo.   Clinical Impression  Pt admitted with above diagnosis. At the time of PT eval, pt was able to demonstrate transfers and ambulation with up to mod assist for balance support, safety, and walker management. Utilized a resting hand splint for walker which pt reports is helpful. Pt was educated on precautions, appropriate activity progression, and positioning recommendations. Pt currently with functional limitations due to the deficits listed below (see PT Problem List).   It appears this patient has had a significant decline in function s/p surgery, and feel a short stay at CIR would be beneficial to maximize functional progress and safety, as well as to decrease burden of care prior to return home with wife's support. Pt reports that wife is chronically ill and while is able to provide supervision and support 24 hours, pt wants to limit the physical assist that she will need to provide for him at d/c. Acutely, pt will benefit from skilled PT to increase their independence and safety with mobility to allow discharge to the venue listed below.      Follow Up Recommendations CIR;Supervision for mobility/OOB    Equipment Recommendations  Rolling walker with 5" wheels    Recommendations for Other Services Rehab consult     Precautions / Restrictions Precautions Precautions: Cervical Precaution Booklet Issued: Yes (comment) Precaution Comments: reviewed with good follow up Restrictions Weight Bearing Restrictions: No Other Position/Activity Restrictions: R hemi weakness       Mobility  Bed Mobility Overal bed mobility: Needs Assistance Bed Mobility: Rolling;Sidelying to Sit;Sit to Sidelying Rolling: Supervision Sidelying to sit: Min guard     Sit to sidelying: Min assist General bed mobility comments: Heavy use of railing, and pt was able to transition to EOB with increased time and VC's for log roll technique.    Transfers Overall transfer level: Needs assistance Equipment used: Rolling walker (2 wheeled) Transfers: Sit to/from Stand Sit to Stand: Min assist;Mod assist         General transfer comment: Assist for power-up to full stand from elevated surface initially. By end of session was able to power-up to full stand from lower chair height however light mod assist was provided to achieve full stand and transition hands from chair to walker.  Ambulation/Gait Ambulation/Gait assistance: Min assist;Mod assist;+2 safety/equipment Gait Distance (Feet): 40 Feet Assistive device: Rolling walker (2 wheeled) Gait Pattern/deviations: Step-to pattern;Decreased stride length;Trunk flexed Gait velocity: Decreased Gait velocity interpretation: <1.31 ft/sec, indicative of household ambulator General Gait Details: R resting hand splint for walker provided which pt reports was helpful to keep hand on walker throughout gait training. Pt with decreased terminal knee extension during stance phase of gait cycle, and noticed pt slowly with increased knee flexion throughout ambulation. 1 seated rest break required after ~15', and then was able to ambulate another ~25' before needing a seated rest break.  Stairs            Wheelchair Mobility    Modified Rankin (Stroke Patients Only)       Balance Overall balance assessment: Needs assistance Sitting-balance support: Feet supported Sitting balance-Leahy Scale: Fair     Standing balance  support: Bilateral upper extremity supported Standing balance-Leahy Scale: Poor Standing balance comment: needing RW  for external support                             Pertinent Vitals/Pain Pain Assessment: Faces Faces Pain Scale: Hurts a little bit Pain Location: Anterior neck Pain Descriptors / Indicators: Operative site guarding Pain Intervention(s): Limited activity within patient's tolerance;Monitored during session;Repositioned    Home Living Family/patient expects to be discharged to:: Private residence Living Arrangements: Spouse/significant other;Children Available Help at Discharge: Family;Available 24 hours/day Type of Home: House Home Access: Stairs to enter Entrance Stairs-Rails: Left;Right;Can reach both Entrance Stairs-Number of Steps: 3 Home Layout: Two level;Able to live on main level with bedroom/bathroom Home Equipment: Cane - quad;Tub bench      Prior Function Level of Independence: Independent with assistive device(s)         Comments: Uses a cane with a seat intermittently for mobility (not in the house).  Still drives, independent with ADL and IADL. Relies on a shopping cart when going out to the store.     Hand Dominance   Dominant Hand: Right    Extremity/Trunk Assessment   Upper Extremity Assessment Upper Extremity Assessment: RUE deficits/detail RUE Deficits / Details: 3+ to 4-/5 in shoulder flexion (improving), Decreased grip and fine motor RUE Sensation: WNL RUE Coordination: decreased fine motor;decreased gross motor    Lower Extremity Assessment Lower Extremity Assessment: Generalized weakness;RLE deficits/detail RLE Deficits / Details: Decreased strength and muscular endurance grossly. R knee mildly buckling, difficulty achieving terminal knee extension bilaterally during stance phase of gait cycle, but R worse than L. RLE Sensation: WNL    Cervical / Trunk Assessment Cervical / Trunk Assessment: Other exceptions Cervical / Trunk Exceptions: s/p surgery; forward head posture with rounded shoulders  Communication   Communication: No  difficulties  Cognition Arousal/Alertness: Awake/alert Behavior During Therapy: WFL for tasks assessed/performed Overall Cognitive Status: Within Functional Limits for tasks assessed                                        General Comments      Exercises     Assessment/Plan    PT Assessment Patient needs continued PT services  PT Problem List Decreased strength;Decreased range of motion;Decreased activity tolerance;Decreased balance;Decreased mobility;Decreased coordination;Decreased safety awareness;Decreased knowledge of use of DME;Decreased knowledge of precautions;Cardiopulmonary status limiting activity;Pain       PT Treatment Interventions DME instruction;Gait training;Stair training;Therapeutic activities;Functional mobility training;Balance training;Neuromuscular re-education;Cognitive remediation;Patient/family education    PT Goals (Current goals can be found in the Care Plan section)  Acute Rehab PT Goals Patient Stated Goal: patient hoping to recover and regain function PT Goal Formulation: With patient/family Time For Goal Achievement: 01/20/21 Potential to Achieve Goals: Good    Frequency Min 5X/week   Barriers to discharge        Co-evaluation               AM-PAC PT "6 Clicks" Mobility  Outcome Measure Help needed turning from your back to your side while in a flat bed without using bedrails?: A Little Help needed moving from lying on your back to sitting on the side of a flat bed without using bedrails?: A Little Help needed moving to and from a bed to a chair (including a wheelchair)?: A Lot Help needed standing up  from a chair using your arms (e.g., wheelchair or bedside chair)?: A Lot Help needed to walk in hospital room?: A Lot Help needed climbing 3-5 steps with a railing? : A Lot 6 Click Score: 14    End of Session Equipment Utilized During Treatment: Gait belt Activity Tolerance: Patient tolerated treatment well Patient  left: in chair;with call bell/phone within reach;with family/visitor present;with nursing/sitter in room Nurse Communication: Mobility status PT Visit Diagnosis: Unsteadiness on feet (R26.81);Pain;Other symptoms and signs involving the nervous system (R29.898) Pain - part of body:  (neck)    Time: [id number] PT Time Calculation (min) (ACUTE ONLY): 40 min   Charges:   PT Evaluation $PT Eval Moderate Complexity: 1 Mod PT Treatments $Gait Training: 23-37 mins        Rolinda Roan, PT, DPT Acute Rehabilitation Services Pager: [phone] Office: [place] 01/06/2021, 11:15 AM

## 2021-01-07 DIAGNOSIS — M4712 Other spondylosis with myelopathy, cervical region: Secondary | ICD-10-CM | POA: Diagnosis not present

## 2021-01-07 MED ORDER — TIZANIDINE HCL 4 MG PO TABS: 4.0000 mg | ORAL_TABLET | Freq: Four times a day (QID) | ORAL | 0 refills | Status: DC | PRN

## 2021-01-07 MED ORDER — OXYCODONE HCL 5 MG PO TABS: 5.0000 mg | ORAL_TABLET | Freq: Four times a day (QID) | ORAL | 0 refills | Status: AC | PRN

## 2021-01-07 NOTE — Progress Notes (Addendum)
Physical Therapy Treatment Patient Details Name: David Smith MRN: [id number] DOB: [birth year] Today's Date: 01/07/2021    History of Present Illness Pt is a 65 y/o male diagnosed with cervical myelopathy due to cervical stenosis at C2/3,3/4.  Underwent Anterior Cervical corpectomy C3  Arthrodesis C2-4 on 01/05/2021.  PMH significant for prostate CA, HTN, hypertrophic obstructive cardiomyopathy, EF 55% 2008, Vertigo.    PT Comments    Pt tolerates treatment well with increased ambulation tolerance and R sided strength. Pt requires reduced physical assistance with all mobility and is able to progress to stair training during session. Pt will benefit from continued aggressive mobilization to further improve activity tolerance and neuromuscular endurance in an effort to reduce falls risk. PT updates recommendations to discharge home with HHPT and a 3in1 commode (pt has already received RW in room).   Follow Up Recommendations  Home health PT;Supervision for mobility/OOB     Equipment Recommendations  Rolling walker with 5" wheels;3in1 (PT)    Recommendations for Other Services       Precautions / Restrictions Precautions Precautions: Cervical Precaution Booklet Issued: Yes (comment) Precaution Comments: reviewed, pt unable to recall neck precautions upon PT arrival Restrictions Weight Bearing Restrictions: No Other Position/Activity Restrictions: R hemi weakness    Mobility  Bed Mobility Overal bed mobility: Needs Assistance Bed Mobility: Supine to Sit;Sit to Supine     Supine to sit: Supervision;HOB elevated Sit to supine: Supervision;HOB elevated   General bed mobility comments: pt avoids bending/twisting neck in supine to sit    Transfers Overall transfer level: Needs assistance Equipment used: Rolling walker (2 wheeled) Transfers: Sit to/from Stand Sit to Stand: Supervision         General transfer comment: verbal cues for hand placement and technique to improve  safety  Ambulation/Gait Ambulation/Gait assistance: Min guard Gait Distance (Feet): 120 Feet (additional trials of 15' x 4) Assistive device: Rolling walker (2 wheeled) Gait Pattern/deviations: Step-to pattern Gait velocity: reduced Gait velocity interpretation: <1.8 ft/sec, indicate of risk for recurrent falls General Gait Details: pt with slowed step-to gait, leading with RLE and following with LLE. Resting hand splint on walker for improved grip   Stairs Stairs: Yes Stairs assistance: Min guard Stair Management: One rail Right;Sideways;Step to pattern Number of Stairs: 3     Wheelchair Mobility    Modified Rankin (Stroke Patients Only)       Balance Overall balance assessment: Needs assistance Sitting-balance support: No upper extremity supported;Feet supported Sitting balance-Leahy Scale: Good     Standing balance support: Bilateral upper extremity supported Standing balance-Leahy Scale: Poor Standing balance comment: needing RW for external support                            Cognition Arousal/Alertness: Awake/alert Behavior During Therapy: WFL for tasks assessed/performed Overall Cognitive Status: Within Functional Limits for tasks assessed                                        Exercises      General Comments General comments (skin integrity, edema, etc.): VSS on RA      Pertinent Vitals/Pain Pain Assessment: Faces Faces Pain Scale: No hurt    Home Living                      Prior Function  PT Goals (current goals can now be found in the care plan section) Acute Rehab PT Goals Patient Stated Goal: patient hoping to recover and regain function Progress towards PT goals: Progressing toward goals    Frequency    Min 5X/week      PT Plan Discharge plan needs to be updated    Co-evaluation              AM-PAC PT "6 Clicks" Mobility   Outcome Measure  Help needed turning from your  back to your side while in a flat bed without using bedrails?: A Little Help needed moving from lying on your back to sitting on the side of a flat bed without using bedrails?: A Little Help needed moving to and from a bed to a chair (including a wheelchair)?: A Little Help needed standing up from a chair using your arms (e.g., wheelchair or bedside chair)?: A Little Help needed to walk in hospital room?: A Little Help needed climbing 3-5 steps with a railing? : A Little 6 Click Score: 18    End of Session   Activity Tolerance: Patient tolerated treatment well Patient left: in bed;with call bell/phone within reach Nurse Communication: Mobility status PT Visit Diagnosis: Unsteadiness on feet (R26.81);Pain;Other symptoms and signs involving the nervous system (R29.898)     Time: [id number] PT Time Calculation (min) (ACUTE ONLY): 39 min  Charges:  $Gait Training: 23-37 mins $Therapeutic Activity: 8-22 mins                     Zenaida Niece, PT, DPT Acute Rehabilitation Pager: [phone]    Zenaida Niece 01/07/2021, 9:16 AM

## 2021-01-07 NOTE — Discharge Instructions (Signed)

## 2021-01-07 NOTE — Progress Notes (Signed)
Occupational Therapy Treatment Patient Details Name: David Smith MRN: [id number] DOB: Oct 26, 1955 Today's Date: 01/07/2021    History of present illness Pt is a 65 y/o male diagnosed with cervical myelopathy due to cervical stenosis at C2/3,3/4.  Underwent Anterior Cervical corpectomy C3  Arthrodesis C2-4 on 01/05/2021.  PMH significant for prostate CA, HTN, hypertrophic obstructive cardiomyopathy, EF 55% 2008, Vertigo.   OT comments  Patient with good progress with all patient focused OT goals.  He is demonstrating improved AROM to R UE.  R shoulder flexion is 2+/5, but the remaining pivots have improved to 3+/5 MMT.  HEP sheets printed and reviewed with patient.  He demonstrates good understanding and teach back.  OT will continue in the acute setting, but he plans on returning home with [facility] services and assist as needed form spouse.    Follow Up Recommendations  Home health OT    Equipment Recommendations       Recommendations for Other Services      Precautions / Restrictions Precautions Precautions: Cervical Precaution Booklet Issued: Yes (comment) Precaution Comments: reviewed, pt unable to recall neck precautions upon PT arrival Restrictions Weight Bearing Restrictions: No Other Position/Activity Restrictions: R hemi weakness       Mobility Bed Mobility Overal bed mobility: Needs Assistance Bed Mobility: Supine to Sit;Sit to Supine     Supine to sit: Supervision;HOB elevated Sit to supine: Supervision;HOB elevated   General bed mobility comments: pt avoids bending/twisting neck in supine to sit    Transfers Overall transfer level: Needs assistance Equipment used: Rolling walker (2 wheeled) Transfers: Sit to/from Stand Sit to Stand: Supervision         General transfer comment: verbal cues for hand placement and technique to improve safety    Balance Overall balance assessment: Needs assistance Sitting-balance support: No upper extremity supported;Feet  supported Sitting balance-Leahy Scale: Good     Standing balance support: Bilateral upper extremity supported Standing balance-Leahy Scale: Poor Standing balance comment: needing RW for external support                                                Cognition Arousal/Alertness: Awake/alert Behavior During Therapy: WFL for tasks assessed/performed Overall Cognitive Status: Within Functional Limits for tasks assessed                                          Exercises Other Exercises Other Exercises: Theraputty exercises to R hand: gross grasp, lateral pinch and tip to tip pinch Other Exercises: AAROM: R shoulder - lateral raise, chest press, overhead press Other Exercises: AROM: fist pumps, wrist flex/ext, forearm sup/pro, and elbow flex/ext   Shoulder Instructions       General Comments VSS on RA    Pertinent Vitals/ Pain       Pain Assessment: Faces Faces Pain Scale: No hurt                                                          Frequency  Min 2X/week        Progress Toward Goals  OT Goals(current goals can now be found in the care plan section)     Acute Rehab OT Goals Patient Stated Goal: patient hoping to recover and regain function OT Goal Formulation: With patient Time For Goal Achievement: 01/20/21 Potential to Achieve Goals: Good  Plan      Co-evaluation                 AM-PAC OT "6 Clicks" Daily Activity     Outcome Measure   Help from another person eating meals?: None Help from another person taking care of personal grooming?: None Help from another person toileting, which includes using toliet, bedpan, or urinal?: A Little Help from another person bathing (including washing, rinsing, drying)?: A Little Help from another person to put on and taking off regular upper body clothing?: None Help from another person to put on and taking off regular lower body clothing?: A  Little 6 Click Score: 21    End of Session    OT Visit Diagnosis: Unsteadiness on feet (R26.81);Other abnormalities of gait and mobility (R26.89);Muscle weakness (generalized) (M62.81)   Activity Tolerance Patient tolerated treatment well   Patient Left in bed;with call bell/phone within reach   Nurse Communication          Time: 0950-1010 OT Time Calculation (min): 20 min  Charges: OT General Charges $OT Visit: 1 Visit OT Treatments $Therapeutic Exercise: 8-22 mins  01/07/2021  Rich, OTR/L  Acute Rehabilitation Services  Office:  [phone]    Metta Clines 01/07/2021, 10:14 AM

## 2021-01-07 NOTE — TOC Initial Note (Signed)
Transition of Care [facility]) - Initial/Assessment Note    Patient Details  Name: David Smith MRN: [id number] Date of Birth: 02-Jan-1956  [facility] CM/SW Contact:    Joanne Chars, LCSW Phone Number: 01/07/2021, 10:31 AM  Clinical Narrative:   CSW met with pt regarding DC recommendation for [facility].  Pt agreeable, choice document provided, pt interested in [place].  PCP in place.  Pt is vaccinated for covid with both boosters.  Permission given to speak with wife Darnelle Bos.  Cory at [place] accepts for [facility].                  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 CMS Medicare.gov Compare Post Acute Care list provided to:: Patient Choice offered to / list presented to : Patient  Expected Discharge Plan and Services Expected Discharge Plan: [place]   Discharge Planning Services: CM Consult Post Acute Care Choice: [place] arrangements for the past 2 months: Single Family Home                 DME Arranged: N/A (DME through [facility] staff)         HH Arranged: PT, OT HH Agency: [place] Date [place]: 01/07/21 Time HH Agency Contacted: 50 Representative spoke with at [place]: Tommi Rumps  Prior Living Arrangements/Services Living arrangements for the past 2 months: [place] with:: Spouse Patient language and need for interpreter reviewed:: Yes Do you feel safe going back to the place where you live?: Yes      Need for Family Participation in Patient Care: Yes (Comment) Care giver support system in place?: Yes (comment) Current home services: Other (comment) (none) Criminal Activity/Legal Involvement Pertinent to Current Situation/Hospitalization: No - Comment as needed  Activities of Daily Living       Permission Sought/Granted Permission sought to share information with : Family Supports Permission granted to share information with : Yes, Verbal Permission Granted  Share Information with NAME: wife Daphne           Emotional Assessment Appearance:: Appears stated age Attitude/Demeanor/Rapport: Engaged Affect (typically observed): Appropriate, Pleasant Orientation: : Oriented to Self, Oriented to Place, Oriented to  Time, Oriented to Situation Alcohol / Substance Use: Not Applicable Psych Involvement: No (comment)  Admission diagnosis:  Cervical spondylosis with myelopathy and radiculopathy [N82.95, M47.22] Patient Active Problem List   Diagnosis Date Noted   Cervical spondylosis with myelopathy and radiculopathy 01/05/2021   Atrial fibrillation with RVR ([place]) 03/02/2020   HOCM (hypertrophic obstructive cardiomyopathy) ([place]) 03/02/2020   Spinal cord compression ([place]) 01/10/2016   Spinal stenosis in cervical region 10/05/2015   PCP NOTES >>>>>>>>>>>>>>>>>>>>>>>> 08/18/2015   Other malaise and fatigue 11/14/2013   Prostate cancer ([place])    Annual physical exam 11/23/2011   Hip pain 09/29/2011   Hyperlipidemia 06/17/2007   HYPERTENSION, BENIGN ESSENTIAL 04/12/2007   PCP:  Colon Branch, MD Pharmacy:   [place] ([place]), [place] - [address] [place] [address] Phone: [phone] Fax: [phone]     Social Determinants of Health ([place]) Interventions    Readmission Risk Interventions No flowsheet data found.

## 2021-01-08 NOTE — Progress Notes (Signed)
Patient discharged to home via wheelchair accompanied by wife and NT with DMEs (rolling walker and 3 in 1 BSC).  Discharge instructions/packet explained with patient and wife and both verbalized understanding.

## 2021-01-10 DIAGNOSIS — I10 Essential (primary) hypertension: Secondary | ICD-10-CM | POA: Diagnosis not present

## 2021-01-10 DIAGNOSIS — I429 Cardiomyopathy, unspecified: Secondary | ICD-10-CM | POA: Diagnosis not present

## 2021-01-10 DIAGNOSIS — I4891 Unspecified atrial fibrillation: Secondary | ICD-10-CM | POA: Diagnosis not present

## 2021-01-10 DIAGNOSIS — Z7901 Long term (current) use of anticoagulants: Secondary | ICD-10-CM | POA: Diagnosis not present

## 2021-01-10 DIAGNOSIS — Z4789 Encounter for other orthopedic aftercare: Secondary | ICD-10-CM | POA: Diagnosis not present

## 2021-01-10 DIAGNOSIS — Z9181 History of falling: Secondary | ICD-10-CM | POA: Diagnosis not present

## 2021-01-10 DIAGNOSIS — Z8546 Personal history of malignant neoplasm of prostate: Secondary | ICD-10-CM | POA: Diagnosis not present

## 2021-01-10 DIAGNOSIS — M4802 Spinal stenosis, cervical region: Secondary | ICD-10-CM | POA: Diagnosis not present

## 2021-01-10 DIAGNOSIS — M4712 Other spondylosis with myelopathy, cervical region: Secondary | ICD-10-CM | POA: Diagnosis not present

## 2021-01-10 IMAGING — MR MR CARD MORPHOLOGY WO/W CM
45 of 48 series · 45 of 48 positions shown · IV contrast (Contrast agent)
Comparison: none

CLINICAL DATA: HCM evaluation

EXAM:
CARDIAC MRI
TECHNIQUE: The patient was scanned on a 1.5 Tesla Siemens magnet. A dedicated
cardiac coil was used. Functional imaging was done using Fiesta
sequences. [DATE], and 4 chamber views were done to assess for RWMA's.
Modified Burtet rule using a short axis stack was used to
calculate an ejection fraction on a dedicated work station using
Circle software. The patient received 10 cc of Gadavist. After 10
minutes inversion recovery sequences were used to assess for
infiltration and scar tissue.
CONTRAST:  10 cc  of Gadavist

[Series 4: t2_haste_db_tra_bh · axial · 8.0mm · 1.56mm/px · 1 of 16 slices shown]
[im 1/16]
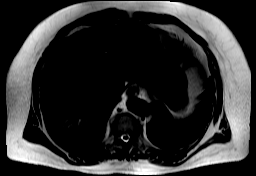

[Series 8: bSSFP · oblique · 8.0mm · 1.70mm/px · 1 of 25 slices shown (1 of 26)]
[im 1/25]
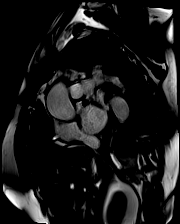

[Series 9: bSSFP · oblique · 8.0mm · 1.70mm/px · 1 of 25 slices shown (2 of 26)]
[im 1/25]
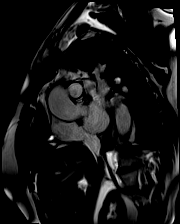

[Series 10: bSSFP · oblique · 8.0mm · 1.70mm/px · 1 of 25 slices shown (3 of 26)]
[im 1/25]
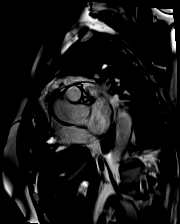

[Series 11: bSSFP · oblique · 8.0mm · 1.70mm/px · 1 of 25 slices shown (4 of 26)]
[im 1/25]
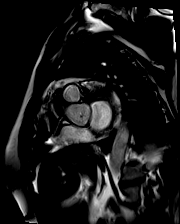

[Series 12: bSSFP · oblique · 8.0mm · 1.70mm/px · 1 of 25 slices shown (5 of 26)]
[im 1/25]
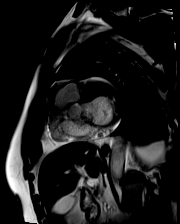

[Series 13: bSSFP · oblique · 8.0mm · 1.70mm/px · 1 of 25 slices shown (6 of 26)]
[im 1/25]
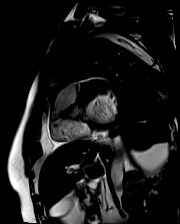

[Series 14: bSSFP · oblique · 8.0mm · 1.70mm/px · 1 of 25 slices shown (7 of 26)]
[im 1/25]
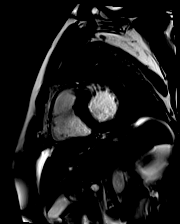

[Series 15: bSSFP · oblique · 8.0mm · 1.70mm/px · 1 of 25 slices shown (8 of 26)]
[im 1/25]
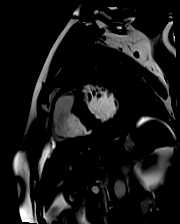

[Series 16: bSSFP · oblique · 8.0mm · 1.70mm/px · 1 of 25 slices shown (9 of 26)]
[im 1/25]
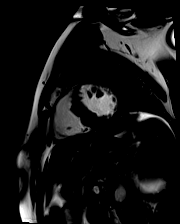

[Series 17: bSSFP · oblique · 8.0mm · 1.70mm/px · 1 of 25 slices shown (10 of 26)]
[im 1/25]
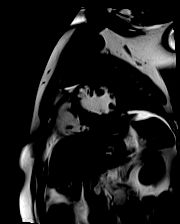

[Series 18: bSSFP · oblique · 8.0mm · 1.70mm/px · 1 of 25 slices shown (11 of 26)]
[im 1/25]
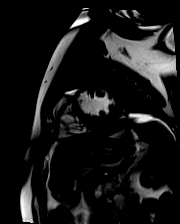

[Series 19: bSSFP · oblique · 8.0mm · 1.70mm/px · 1 of 25 slices shown (12 of 26)]
[im 1/25]
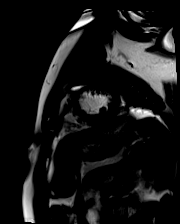

[Series 20: bSSFP · oblique · 8.0mm · 1.70mm/px · 1 of 25 slices shown (13 of 26)]
[im 1/25]
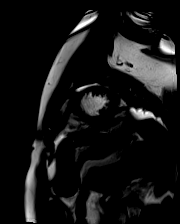

[Series 21: bSSFP · oblique · 8.0mm · 1.70mm/px · 1 of 25 slices shown (14 of 26)]
[im 1/25]
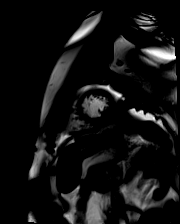

[Series 22: bSSFP · oblique · 8.0mm · 1.70mm/px · 1 of 25 slices shown (15 of 26)]
[im 1/25]
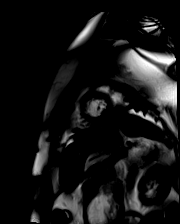

[Series 23: bSSFP · oblique · 8.0mm · 1.70mm/px · 1 of 25 slices shown (16 of 26)]
[im 1/25]
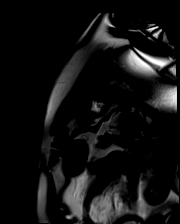

[Series 24: bSSFP · oblique · 8.0mm · 1.70mm/px · 1 of 25 slices shown (17 of 26)]
[im 1/25]
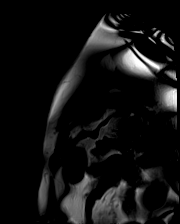

[Series 25: bSSFP · oblique · 8.0mm · 1.70mm/px · 1 of 25 slices shown (18 of 26)]
[im 1/25]
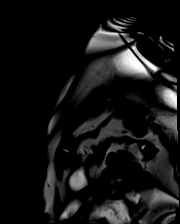

[Series 26: (id)_long_t1 · oblique · 8.0mm · 1.56mm/px · 1 of 24 slices shown]
[im 1/24]
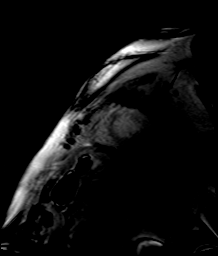

[Series 27: (id)_long_t1_moco · oblique · 8.0mm · 1.56mm/px · 1 of 24 slices shown]
[im 1/24]
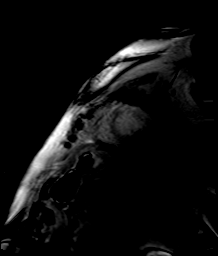

[Series 28: (id)_long_t1_moco_t1 · oblique · 8.0mm · 1.56mm/px · 1 of 3 slices shown]
[im 1/3]
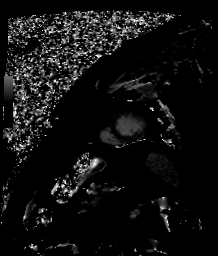

[Series 30: (id)_trufi · oblique · 8.0mm · 2.08mm/px · 1 of 9 slices shown]
[im 1/9]
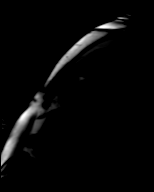

[Series 31: (id)_trufi_moco · oblique · 8.0mm · 2.08mm/px · 1 of 9 slices shown]
[im 1/9]
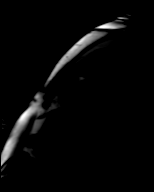

[Series 32: (id)_trufi_moco_t2 · oblique · 8.0mm · 2.08mm/px · 1 of 3 slices shown]
[im 1/3]
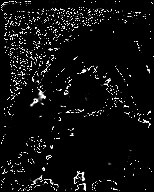

[Series 34: bSSFP · oblique · 6.0mm · 1.64mm/px · 1 of 25 slices shown (19 of 26)]
[im 1/25]
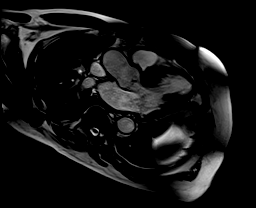

[Series 35: bSSFP · oblique · 6.0mm · 1.41mm/px · 1 of 25 slices shown (20 of 26)]
[im 1/25]
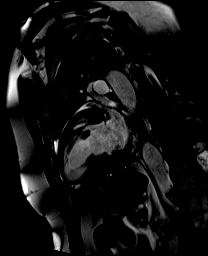

[Series 37: bSSFP · axial · 6.0mm · 1.56mm/px · 1 of 25 slices shown (21 of 26)]
[im 1/25]
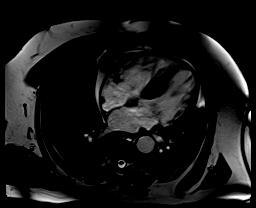

[Series 38: bSSFP · coronal · 6.0mm · 1.56mm/px · 1 of 25 slices shown (22 of 26)]
[im 1/25]
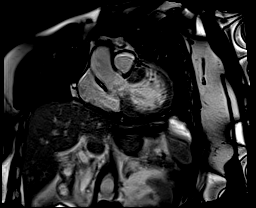

[Series 39: aortic valve cine · oblique · 6.0mm · 1.56mm/px · 1 of 25 slices shown]
[im 1/25]
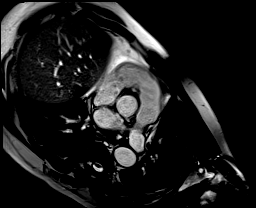

[Series 40: cine rvit · oblique · 6.0mm · 1.56mm/px · 1 of 25 slices shown]
[im 1/25]
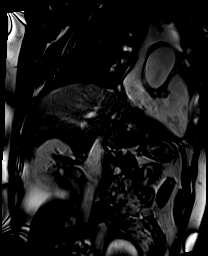

[Series 41: cine rvot · sagittal · 6.0mm · 1.48mm/px · 1 of 25 slices shown]
[im 1/25]
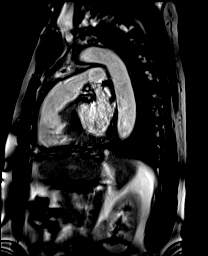

[Series 47: lge_single shot 4 · axial · 6.0mm · 2.08mm/px · 1 of 1 slices shown (1 of 2)]
[im 1/1]
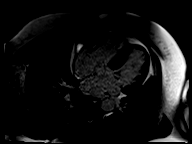

[Series 48: lge_single shot 4 · axial · 6.0mm · 2.08mm/px · 1 of 1 slices shown (2 of 2)]
[im 1/1]
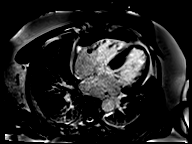

[Series 51: lge_single shot sa · oblique · 8.0mm · 2.14mm/px · 1 of 10 slices shown (1 of 2)]
[im 1/10]
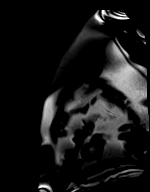

[Series 52: lge_single shot sa · oblique · 8.0mm · 2.14mm/px · 1 of 10 slices shown (2 of 2)]
[im 1/10]
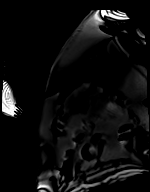

[Series 55: (id)_short_t1 · oblique · 8.0mm · 1.56mm/px · 1 of 27 slices shown]
[im 1/27]
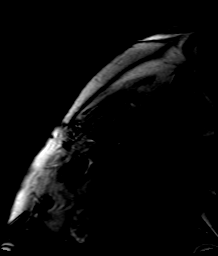

[Series 56: (id)_short_t1_moco · oblique · 8.0mm · 1.56mm/px · 1 of 27 slices shown]
[im 1/27]
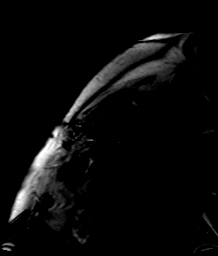

[Series 57: (id)_short_t1_moco_t1 · 1 of 3 slices shown (1 of 2)]
[im 1/3]
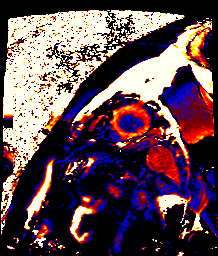

[Series 57: (id)_short_t1_moco_t1 · oblique · 8.0mm · 1.56mm/px · 1 of 3 slices shown (2 of 2)]
[im 1/3]
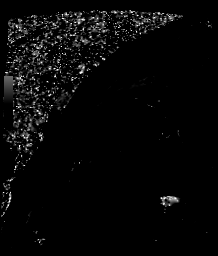

[Series 60: bSSFP · oblique · 8.0mm · 1.92mm/px · 1 of 15 slices shown (23 of 26)]
[im 1/15]
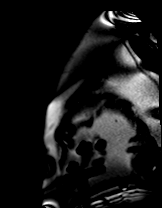

[Series 61: bSSFP · oblique · 8.0mm · 1.92mm/px · 1 of 15 slices shown (24 of 26)]
[im 1/15]
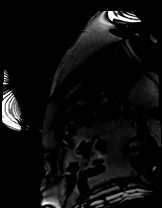

[Series 64: bSSFP · axial · 6.0mm · 1.92mm/px · 1 of 1 slices shown (25 of 26)]
[im 1/1]
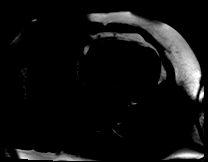

[Series 65: bSSFP · axial · 6.0mm · 1.92mm/px · 1 of 1 slices shown (26 of 26)]
[im 1/1]
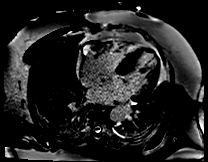

[Series 74: pc flow_200_3 ch_breath · oblique · 6.0mm · 1.92mm/px · 1 of 30 slices shown]
[im 1/30]
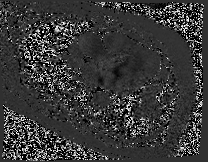

[45 of 48 positions shown; findings below may reference images not displayed]

FINDINGS: Left ventricle:

-Asymmetric hypertrophy measuring up to 23mm in basal septum (9mm in
posterior wall)

-Normal size

-Hyperdynamic systolic function

-Normal ECV (25%)

-RV insertion site LGE. LGE accounts for 2% of total myocardial mass

LV EF: 74% (Normal 56-78%)

Absolute volumes:

LV EDV: 213mL (Normal 77-195 mL)

LV ESV: 56mL (Normal 19-72 mL)

LV SV: 158mL (Normal 51-133 mL)

CO: 10.0L/min (Normal 2.8-8.8 L/min)

Indexed volumes:

LV EDV: 89mL/sq-m (Normal 47-92 mL/sq-m)

LV ESV: 23mL/sq-m (Normal 13-30 mL/sq-m)

LV SV: 66mL/sq-m (Normal 32-62 mL/sq-m)

CI: 4.1L/min/sq-m (Normal 1.7-4.2 L/min/sq-m)

Right ventricle: Normal size and systolic function

RV EF:  65% (Normal 47-74%)

Absolute volumes:

RV EDV: 175mL (Normal 88-227 mL)

RV ESV: 61mL (Normal 23-103 mL)

RV SV: 113mL (Normal 52-138 mL)

CO: 7.2L/min (Normal 2.8-8.8 L/min)

Indexed volumes:

RV EDV: 73mL/sq-m (Normal 55-105 mL/sq-m)

RV ESV: 26mL/sq-m (Normal 15-43 mL/sq-m)

RV SV: 47mL/sq-m (Normal 32-64 mL/sq-m)

CI: 3.0L/min/sq-m (Normal 1.7-4.2 L/min/sq-m)

Left atrium: Moderate enlargement

Right atrium: Moderate enlargement

Mitral valve: Trivial regurgitation

Aortic valve: No regurgitation

Tricuspid valve: Trivial regurgitation

Pulmonic valve: No regurgitation

Aorta: Dilated ascending aorta measuring 44mm

Pericardium: Normal
IMPRESSION: 1. Asymmetric LV hypertrophy measuring up to 23mm in basal septum
(9mm in posterior wall), consistent with hypertrophic cardiomyopathy

2. RV insertion site late gadolinium enhancement, consistent with
HCM. LGE accounts for 2% of total myocardial mass

3.  Normal LV size with hyperdynamic systolic function (EF 74%)

4.  Normal RV size and systolic function (EF 65%)

5.  Dilated ascending aorta measuring 44mm

## 2021-01-11 DIAGNOSIS — I429 Cardiomyopathy, unspecified: Secondary | ICD-10-CM | POA: Diagnosis not present

## 2021-01-11 DIAGNOSIS — Z4789 Encounter for other orthopedic aftercare: Secondary | ICD-10-CM | POA: Diagnosis not present

## 2021-01-11 DIAGNOSIS — Z8546 Personal history of malignant neoplasm of prostate: Secondary | ICD-10-CM | POA: Diagnosis not present

## 2021-01-11 DIAGNOSIS — Z9181 History of falling: Secondary | ICD-10-CM | POA: Diagnosis not present

## 2021-01-11 DIAGNOSIS — M4712 Other spondylosis with myelopathy, cervical region: Secondary | ICD-10-CM | POA: Diagnosis not present

## 2021-01-11 DIAGNOSIS — M4802 Spinal stenosis, cervical region: Secondary | ICD-10-CM | POA: Diagnosis not present

## 2021-01-11 DIAGNOSIS — Z7901 Long term (current) use of anticoagulants: Secondary | ICD-10-CM | POA: Diagnosis not present

## 2021-01-11 DIAGNOSIS — I10 Essential (primary) hypertension: Secondary | ICD-10-CM | POA: Diagnosis not present

## 2021-01-11 DIAGNOSIS — I4891 Unspecified atrial fibrillation: Secondary | ICD-10-CM | POA: Diagnosis not present

## 2021-01-14 ENCOUNTER — Telehealth: Payer: Self-pay | Admitting: Cardiovascular Disease

## 2021-01-14 DIAGNOSIS — I429 Cardiomyopathy, unspecified: Secondary | ICD-10-CM | POA: Diagnosis not present

## 2021-01-14 DIAGNOSIS — M4712 Other spondylosis with myelopathy, cervical region: Secondary | ICD-10-CM | POA: Diagnosis not present

## 2021-01-14 DIAGNOSIS — Z8546 Personal history of malignant neoplasm of prostate: Secondary | ICD-10-CM | POA: Diagnosis not present

## 2021-01-14 DIAGNOSIS — I4891 Unspecified atrial fibrillation: Secondary | ICD-10-CM | POA: Diagnosis not present

## 2021-01-14 DIAGNOSIS — Z4789 Encounter for other orthopedic aftercare: Secondary | ICD-10-CM | POA: Diagnosis not present

## 2021-01-14 DIAGNOSIS — Z7901 Long term (current) use of anticoagulants: Secondary | ICD-10-CM | POA: Diagnosis not present

## 2021-01-14 DIAGNOSIS — I10 Essential (primary) hypertension: Secondary | ICD-10-CM | POA: Diagnosis not present

## 2021-01-14 DIAGNOSIS — M4802 Spinal stenosis, cervical region: Secondary | ICD-10-CM | POA: Diagnosis not present

## 2021-01-14 DIAGNOSIS — Z9181 History of falling: Secondary | ICD-10-CM | POA: Diagnosis not present

## 2021-01-14 NOTE — Telephone Encounter (Signed)
Called patient . Phone busy X2 will try again

## 2021-01-14 NOTE — Telephone Encounter (Signed)
Spoke to David Smith states he is no longer at patient's home.  Per David Smith , patient had not take n morning medication , but was not in distress. Patient had taken medication prior to [place] leaving the home. RN will contact patient.

## 2021-01-14 NOTE — Telephone Encounter (Signed)
Pt c/o BP issue: STAT if pt c/o blurred vision, one-sided weakness or slurred speech  1. What are your last 5 BP readings? 160/100 before medine and half hour later after medicine it still was 160/100  2. Are you having any other symptoms (ex. Dizziness, headache, blurred vision, passed out)? Upper belly feels like gas since last night- on a scale from 0-to 10 it is 2 at rest and 4 when he walks around  3. What is your BP issue? Blood pressure is running high   Jim from [place] called to report this high blood pressure

## 2021-01-14 NOTE — Discharge Summary (Signed)
Physician Discharge Summary  Patient ID: David Smith MRN: [id number] DOB/AGE: 63-Jun-1957 63 y.o.  Admit date: 01/05/2021 Discharge date: 01/14/2021  Admission Diagnoses:Cervical spondylosis with myelopathy Spinal stenosis of cervical region C2/3, 3/4 Discharge Diagnoses: Spinal stenosis of cervical region C2/3, 3/4 Active Problems:   Cervical spondylosis with myelopathy and radiculopathy   Discharged Condition: good  Hospital Course: David Smith was admitted and taken to the hospital for a C3 corpectomy, interbody peek strut C2-4, and arthrodesis [employer] Anterior plating C2-4 nuvasive Post op David Smith was very weak bilaterally in both upper and lower extremities especially the right. Over 48 hours David Smith rapidly regained his strength, and while initially it was felt David Smith would be ideal for a inpatient rehabilitation stay, David Smith progressed to a home health status.  Speaking voice was strong, at discharge approximately 4+/5 globally and ambulating.  David Smith was voiding, and ambulating , and tolerating a regular diet. His wound was clean, dry, and without signs of infection.  Treatments: surgery: Anterior Cervical corpectomy C3 Arthrodesis C2-4 with 78m Peek strut filled with autograft morsels Anterior instrumentation(Nuvasive) C2-4  Discharge Exam: Blood pressure (!) 158/83, pulse (!) 56, temperature 98.4 F (36.9 C), temperature source Oral, resp. rate 18, height '6\' 1"'$  (1.854 m), weight 118.9 kg, SpO2 96 %. General appearance: alert, cooperative, appears stated age, and mild distress  Disposition: Discharge disposition: 01-Home or Self Care      Spinal stenosis of cervical region  Allergies as of 01/08/2021   No Known Allergies      Medication List     TAKE these medications    apixaban 5 MG Tabs tablet Commonly known as: ELIQUIS Take 1 tablet (5 mg total) by mouth 2 (two) times daily.   diltiazem 240 MG 24 hr capsule Commonly known as: CARDIZEM CD Take 1 capsule (240 mg total) by  mouth daily.   FISH OIL PO Take 1 capsule by mouth once a week.   leuprolide 30 MG injection Commonly known as: LUPRON Inject 60 mg into the muscle every 6 (six) months.   meclizine 25 MG tablet Commonly known as: ANTIVERT Take 1 tablet (25 mg total) by mouth 3 (three) times daily as needed for dizziness.   multivitamin with minerals Tabs tablet Take 1 tablet by mouth daily.   oxyCODONE 5 MG immediate release tablet Commonly known as: Oxy IR/ROXICODONE Take 1 tablet (5 mg total) by mouth every 6 (six) hours as needed for up to 8 days for moderate pain ((score 4 to 6)).   rosuvastatin 20 MG tablet Commonly known as: CRESTOR Take 20 mg by mouth daily.   tiZANidine 4 MG tablet Commonly known as: ZANAFLEX Take 1 tablet (4 mg total) by mouth every 6 (six) hours as needed for muscle spasms.   VITAMIN B 12 PO Take 1 tablet by mouth 3 (three) times a week.   VITAMIN C PO Take 1 tablet by mouth 3 (three) times a week.        Follow-up Information     [facility]   Specialty: Home Health Services Why: BAlvis Lemmingswill contact you to schedule your first home visit. Contact information: [phone]         CAshok Pall MD Follow up in 3 week(s).   Specialty: Neurosurgery Why: please call to make an appointment Contact information: [address] [phone]  SignedAshok Pall 01/14/2021, 4:37 PM

## 2021-01-17 DIAGNOSIS — I10 Essential (primary) hypertension: Secondary | ICD-10-CM | POA: Diagnosis not present

## 2021-01-17 DIAGNOSIS — M4802 Spinal stenosis, cervical region: Secondary | ICD-10-CM | POA: Diagnosis not present

## 2021-01-17 DIAGNOSIS — Z8546 Personal history of malignant neoplasm of prostate: Secondary | ICD-10-CM | POA: Diagnosis not present

## 2021-01-17 DIAGNOSIS — Z4789 Encounter for other orthopedic aftercare: Secondary | ICD-10-CM | POA: Diagnosis not present

## 2021-01-17 DIAGNOSIS — M4712 Other spondylosis with myelopathy, cervical region: Secondary | ICD-10-CM | POA: Diagnosis not present

## 2021-01-17 DIAGNOSIS — I4891 Unspecified atrial fibrillation: Secondary | ICD-10-CM | POA: Diagnosis not present

## 2021-01-17 DIAGNOSIS — Z9181 History of falling: Secondary | ICD-10-CM | POA: Diagnosis not present

## 2021-01-17 DIAGNOSIS — I429 Cardiomyopathy, unspecified: Secondary | ICD-10-CM | POA: Diagnosis not present

## 2021-01-17 DIAGNOSIS — Z7901 Long term (current) use of anticoagulants: Secondary | ICD-10-CM | POA: Diagnosis not present

## 2021-01-18 DIAGNOSIS — I429 Cardiomyopathy, unspecified: Secondary | ICD-10-CM | POA: Diagnosis not present

## 2021-01-18 DIAGNOSIS — M4802 Spinal stenosis, cervical region: Secondary | ICD-10-CM | POA: Diagnosis not present

## 2021-01-18 DIAGNOSIS — Z7901 Long term (current) use of anticoagulants: Secondary | ICD-10-CM | POA: Diagnosis not present

## 2021-01-18 DIAGNOSIS — Z9181 History of falling: Secondary | ICD-10-CM | POA: Diagnosis not present

## 2021-01-18 DIAGNOSIS — Z4789 Encounter for other orthopedic aftercare: Secondary | ICD-10-CM | POA: Diagnosis not present

## 2021-01-18 DIAGNOSIS — Z8546 Personal history of malignant neoplasm of prostate: Secondary | ICD-10-CM | POA: Diagnosis not present

## 2021-01-18 DIAGNOSIS — I4891 Unspecified atrial fibrillation: Secondary | ICD-10-CM | POA: Diagnosis not present

## 2021-01-18 DIAGNOSIS — M4712 Other spondylosis with myelopathy, cervical region: Secondary | ICD-10-CM | POA: Diagnosis not present

## 2021-01-18 DIAGNOSIS — I10 Essential (primary) hypertension: Secondary | ICD-10-CM | POA: Diagnosis not present

## 2021-01-19 DIAGNOSIS — Z8546 Personal history of malignant neoplasm of prostate: Secondary | ICD-10-CM | POA: Diagnosis not present

## 2021-01-19 DIAGNOSIS — M4712 Other spondylosis with myelopathy, cervical region: Secondary | ICD-10-CM | POA: Diagnosis not present

## 2021-01-19 DIAGNOSIS — M4802 Spinal stenosis, cervical region: Secondary | ICD-10-CM | POA: Diagnosis not present

## 2021-01-19 DIAGNOSIS — Z7901 Long term (current) use of anticoagulants: Secondary | ICD-10-CM | POA: Diagnosis not present

## 2021-01-19 DIAGNOSIS — I4891 Unspecified atrial fibrillation: Secondary | ICD-10-CM | POA: Diagnosis not present

## 2021-01-19 DIAGNOSIS — I429 Cardiomyopathy, unspecified: Secondary | ICD-10-CM | POA: Diagnosis not present

## 2021-01-19 DIAGNOSIS — I10 Essential (primary) hypertension: Secondary | ICD-10-CM | POA: Diagnosis not present

## 2021-01-19 DIAGNOSIS — Z9181 History of falling: Secondary | ICD-10-CM | POA: Diagnosis not present

## 2021-01-19 DIAGNOSIS — Z4789 Encounter for other orthopedic aftercare: Secondary | ICD-10-CM | POA: Diagnosis not present

## 2021-01-24 DIAGNOSIS — M4712 Other spondylosis with myelopathy, cervical region: Secondary | ICD-10-CM | POA: Diagnosis not present

## 2021-01-24 DIAGNOSIS — Z7901 Long term (current) use of anticoagulants: Secondary | ICD-10-CM | POA: Diagnosis not present

## 2021-01-24 DIAGNOSIS — Z8546 Personal history of malignant neoplasm of prostate: Secondary | ICD-10-CM | POA: Diagnosis not present

## 2021-01-24 DIAGNOSIS — M4802 Spinal stenosis, cervical region: Secondary | ICD-10-CM | POA: Diagnosis not present

## 2021-01-24 DIAGNOSIS — I429 Cardiomyopathy, unspecified: Secondary | ICD-10-CM | POA: Diagnosis not present

## 2021-01-24 DIAGNOSIS — I4891 Unspecified atrial fibrillation: Secondary | ICD-10-CM | POA: Diagnosis not present

## 2021-01-24 DIAGNOSIS — Z9181 History of falling: Secondary | ICD-10-CM | POA: Diagnosis not present

## 2021-01-24 DIAGNOSIS — I10 Essential (primary) hypertension: Secondary | ICD-10-CM | POA: Diagnosis not present

## 2021-01-24 DIAGNOSIS — Z4789 Encounter for other orthopedic aftercare: Secondary | ICD-10-CM | POA: Diagnosis not present

## 2021-01-27 DIAGNOSIS — M488X2 Other specified spondylopathies, cervical region: Secondary | ICD-10-CM | POA: Diagnosis not present

## 2021-01-28 DIAGNOSIS — Z8546 Personal history of malignant neoplasm of prostate: Secondary | ICD-10-CM | POA: Diagnosis not present

## 2021-01-28 DIAGNOSIS — I429 Cardiomyopathy, unspecified: Secondary | ICD-10-CM | POA: Diagnosis not present

## 2021-01-28 DIAGNOSIS — Z4789 Encounter for other orthopedic aftercare: Secondary | ICD-10-CM | POA: Diagnosis not present

## 2021-01-28 DIAGNOSIS — Z7901 Long term (current) use of anticoagulants: Secondary | ICD-10-CM | POA: Diagnosis not present

## 2021-01-28 DIAGNOSIS — Z9181 History of falling: Secondary | ICD-10-CM | POA: Diagnosis not present

## 2021-01-28 DIAGNOSIS — M4712 Other spondylosis with myelopathy, cervical region: Secondary | ICD-10-CM | POA: Diagnosis not present

## 2021-01-28 DIAGNOSIS — I4891 Unspecified atrial fibrillation: Secondary | ICD-10-CM | POA: Diagnosis not present

## 2021-01-28 DIAGNOSIS — I10 Essential (primary) hypertension: Secondary | ICD-10-CM | POA: Diagnosis not present

## 2021-01-28 DIAGNOSIS — M4802 Spinal stenosis, cervical region: Secondary | ICD-10-CM | POA: Diagnosis not present

## 2021-01-28 NOTE — Telephone Encounter (Signed)
Called patient, he states that his blood pressure is much better here recently. 130/80's. He advised that he is taking his medications, and would call with any issues.   He was advised to keep a BP log, and to call with any consistent increase blood pressures.

## 2021-01-31 DIAGNOSIS — I10 Essential (primary) hypertension: Secondary | ICD-10-CM | POA: Diagnosis not present

## 2021-01-31 DIAGNOSIS — Z7901 Long term (current) use of anticoagulants: Secondary | ICD-10-CM | POA: Diagnosis not present

## 2021-01-31 DIAGNOSIS — I429 Cardiomyopathy, unspecified: Secondary | ICD-10-CM | POA: Diagnosis not present

## 2021-01-31 DIAGNOSIS — Z4789 Encounter for other orthopedic aftercare: Secondary | ICD-10-CM | POA: Diagnosis not present

## 2021-01-31 DIAGNOSIS — I4891 Unspecified atrial fibrillation: Secondary | ICD-10-CM | POA: Diagnosis not present

## 2021-01-31 DIAGNOSIS — M4712 Other spondylosis with myelopathy, cervical region: Secondary | ICD-10-CM | POA: Diagnosis not present

## 2021-01-31 DIAGNOSIS — Z8546 Personal history of malignant neoplasm of prostate: Secondary | ICD-10-CM | POA: Diagnosis not present

## 2021-01-31 DIAGNOSIS — M4802 Spinal stenosis, cervical region: Secondary | ICD-10-CM | POA: Diagnosis not present

## 2021-01-31 DIAGNOSIS — Z9181 History of falling: Secondary | ICD-10-CM | POA: Diagnosis not present

## 2021-02-04 ENCOUNTER — Telehealth: Payer: Self-pay | Admitting: Cardiovascular Disease

## 2021-02-04 DIAGNOSIS — M4712 Other spondylosis with myelopathy, cervical region: Secondary | ICD-10-CM | POA: Diagnosis not present

## 2021-02-04 DIAGNOSIS — Z8546 Personal history of malignant neoplasm of prostate: Secondary | ICD-10-CM | POA: Diagnosis not present

## 2021-02-04 DIAGNOSIS — I429 Cardiomyopathy, unspecified: Secondary | ICD-10-CM | POA: Diagnosis not present

## 2021-02-04 DIAGNOSIS — Z7901 Long term (current) use of anticoagulants: Secondary | ICD-10-CM | POA: Diagnosis not present

## 2021-02-04 DIAGNOSIS — I4891 Unspecified atrial fibrillation: Secondary | ICD-10-CM | POA: Diagnosis not present

## 2021-02-04 DIAGNOSIS — I10 Essential (primary) hypertension: Secondary | ICD-10-CM | POA: Diagnosis not present

## 2021-02-04 DIAGNOSIS — Z9181 History of falling: Secondary | ICD-10-CM | POA: Diagnosis not present

## 2021-02-04 DIAGNOSIS — Z4789 Encounter for other orthopedic aftercare: Secondary | ICD-10-CM | POA: Diagnosis not present

## 2021-02-04 DIAGNOSIS — M4802 Spinal stenosis, cervical region: Secondary | ICD-10-CM | POA: Diagnosis not present

## 2021-02-04 NOTE — Telephone Encounter (Signed)
New Message:    David Smith saw the patient this morning before he took his medicine. His blood pressure was elevated with no symptoms.     Pt c/o BP issue: STAT if pt c/o blurred vision, one-sided weakness or slurred speech  1. What are your last 5 BP readings? 176/100, 140/82 last visit on 01-31-21   2. Are you having any other symptoms (ex. Dizziness, headache, blurred vision, passed out)? No symptoms  3. What is your BP issue? Elevated blood pressure

## 2021-02-04 NOTE — Telephone Encounter (Signed)
Spoke with the patient. He stated that he had not taken his blood pressure medication when Clair Gulling came. While on the phone his pressure was 124/80. He has been advised to keep track of his blood pressure and to call if it is elevated after he has taken his medication.

## 2021-02-08 ENCOUNTER — Other Ambulatory Visit: Payer: Self-pay

## 2021-02-08 ENCOUNTER — Ambulatory Visit (INDEPENDENT_AMBULATORY_CARE_PROVIDER_SITE_OTHER): Payer: Medicare HMO | Admitting: Internal Medicine

## 2021-02-08 ENCOUNTER — Encounter: Payer: Self-pay | Admitting: Internal Medicine

## 2021-02-08 VITALS — BP 122/74 | HR 60 | Temp 98.4°F | Resp 18 | Ht 73.0 in | Wt 252.1 lb

## 2021-02-08 DIAGNOSIS — M546 Pain in thoracic spine: Secondary | ICD-10-CM | POA: Diagnosis not present

## 2021-02-08 NOTE — Progress Notes (Signed)
Subjective:    Patient ID: David Smith, male    DOB: [birth year], 65 y.o.   MRN: [id number]  DOS:  02/08/2021 Type of visit - description: Acute visit Here with his wife. Had cervical surgery   about a month ago. The surgery went well, he is a still recuperating.  Reason for visit today: For the last 2 weeks is having a sharp pain triggered by bending or twisting his torso. It originates at the right lateral chest and radiates in a band-like shape all the way to the left. It lasts seconds.  He denies the pain being triggered by eating.  Appetite is okay. Denies chest pain or difficulty breathing.  No edema No cough No calf pain.  No rash.   Review of Systems See above   Past Medical History:  Diagnosis Date   Chronic lower back pain 06/11/2012   daily a problem Pain level 7, Tylenol helpful(down to level 2)   Depression    Due to med for his cancer - Lupron   H/O cardiovascular stress test    non ischemic EF 55% 05-15-2007   Heart murmur    dx as a teen, Stress ECHO 2008 (-)    Hypertension    Hypertrophic obstructive cardiomyopathy (HOCM) (HCC)    PAF (paroxysmal atrial fibrillation) ([place])    Prostate cancer metastatic to intrapelvic lymph node ([facility])    gleason 4+3=7, volume 56.4 cc, PSA 34.25    Past Surgical History:  Procedure Laterality Date   ACHILLES TENDON REPAIR     26 yrs ago, right foot   ANAL FISSURECTOMY  2008   ANTERIOR CERVICAL DECOMP/DISCECTOMY FUSION N/A 01/05/2021   Procedure: Cervical Three Corpectomy;  Surgeon: Ashok Pall, MD;  Location: [place];  Service: Neurosurgery;  Laterality: N/A;   dental procedure  06-11-12   minor -office procedure to correct jaw issue-used anesthesia gas to do   LYMPHADENECTOMY  06/17/2012   Procedure: LYMPHADENECTOMY;  Surgeon: [name], MD;  Location: [facility];  Service: Urology;  Laterality: Bilateral;   PROSTATE BIOPSY     ROBOT ASSISTED LAPAROSCOPIC RADICAL PROSTATECTOMY  06/17/2012   Procedure: ROBOTIC ASSISTED  LAPAROSCOPIC RADICAL PROSTATECTOMY LEVEL 2;  Surgeon: [name], MD;  Location: [facility];  Service: Urology;  Laterality: N/A;       Social History   Socioeconomic History   Marital status: Married    Spouse name: Not on file   Number of children: 3   Years of education: Not on file   Highest education level: Not on file  Occupational History   Occupation: disability d/t back pain-not working at present  Tobacco Use   Smoking status: Never   Smokeless tobacco: Never  Vaping Use   Vaping Use: Never used  Substance and Sexual Activity   Alcohol use: Not Currently    Alcohol/week: 0.0 standard drinks   Drug use: No   Sexual activity: Yes  Other Topics Concern   Not on file  Social History Narrative   Household: pt, wife 56 y/o child    Right handed   Social Determinants of Health   Financial Resource Strain: Not on file  Food Insecurity: Not on file  Transportation Needs: Not on file  Physical Activity: Not on file  Stress: Not on file  Social Connections: Not on file  Intimate Partner Violence: Not on file     Allergies as of 02/08/2021   No Known Allergies      Medication List  Accurate as of February 08, 2021 11:59 PM. If you have any questions, ask your nurse or doctor.          apixaban 5 MG Tabs tablet Commonly known as: ELIQUIS Take 1 tablet (5 mg total) by mouth 2 (two) times daily.   diltiazem 240 MG 24 hr capsule Commonly known as: CARDIZEM CD Take 1 capsule (240 mg total) by mouth daily.   FISH OIL PO Take 1 capsule by mouth once a week.   leuprolide 30 MG injection Commonly known as: LUPRON Inject 60 mg into the muscle every 6 (six) months.   meclizine 25 MG tablet Commonly known as: ANTIVERT Take 1 tablet (25 mg total) by mouth 3 (three) times daily as needed for dizziness.   multivitamin with minerals Tabs tablet Take 1 tablet by mouth daily.   rosuvastatin 20 MG tablet Commonly known as: CRESTOR Take 20 mg by mouth  daily.   tiZANidine 4 MG tablet Commonly known as: ZANAFLEX Take 1 tablet (4 mg total) by mouth every 6 (six) hours as needed for muscle spasms.   VITAMIN B 12 PO Take 1 tablet by mouth 3 (three) times a week.   VITAMIN C PO Take 1 tablet by mouth 3 (three) times a week.           Objective:   Physical Exam BP 122/74 (BP Location: Left Arm, Patient Position: Sitting, Cuff Size: Normal)   Pulse 60   Temp 98.4 F (36.9 C) (Oral)   Resp 18   Ht '6\' 1"'$  (1.854 m)   Wt 252 lb 2 oz (114.4 kg)   SpO2 98%   BMI 33.26 kg/m  General:   Well developed, NAD, BMI noted. HEENT:  Normocephalic . Face symmetric, atraumatic Lungs:  CTA B Normal respiratory effort, no intercostal retractions, no accessory muscle use. Heart: RRR,  no murmur.  Lower extremities: no pretibial edema bilaterally MSK: No TTP at the thoracic spine Skin: Not pale. Not jaundice.  No rash Neurologic:  alert & oriented X3.  Speech normal, gait assisted by a walker, walks very slow, small steps, consistently bends forward when he walks. Psych--  Cognition and judgment appear intact.  Cooperative with normal attention span and concentration.  Behavior appropriate. No anxious or depressed appearing.      Assessment    Assessment No transfusions, Jehovah witness hyperglycemia: 01-2016 A1C >>> 6.0 HTN Dyslipidemia Insomnia-- no need for med as off 07/2017 Chronic back pain, offered surgery at some point, declined  CV: ---New onset A. fib (02-2020) ---New diagnosis HOCM (02-2020) --- Ascending Aorta dilatation GU: --Prostate cancer, Hormone sensitive.DX 2013, PSA 34.25, s/p prostatectomy, lymphadenectomy 2014. S/p XRT; + lymph nods mets , PSA increased, s/p lupron   --Hematuria- CT -cysto 2016 per urology-- likely d/t h/o XRT Mild anemia, normal iron and ferritin 06-2015 H/o heart murmur, stress echo 2008 negative Syncope, dizziness  headaches:  -Saw neurology 4-17,w/u done, CTA head, neck, then neck MRI:  dx w/ spinal compression, saw surgery 10-2015: rx non emergent surgery (no surgery as off 01-2017), finding was not causing HAs-dizzines  -Cervical spinal compression per MRI 09-2015 Shingles, R face, 03-2016, saw ophtalmology  PLAN: Thoracic pain: Pain as described above, seems to be a  mechanical issue, in the context of a history of prostate cancer with the last PSA below 0 and a recent neck surgery that is affecting his posture (see physical exam). He denies a rash, fever or chills.  No red flags. Overall I think this  pain is postural.  Patient and his wife agrees with me. Plan: We agreed on observation,  if he is not gradually better he will let me know.  The patient and their wife agree. Cervical spinal stenosis: Status post C-spine corpectomy 01-2021    This visit occurred during the SARS-CoV-2 public health emergency.  Safety protocols were in place, including screening questions prior to the visit, additional usage of staff PPE, and extensive cleaning of exam room while observing appropriate contact time as indicated for disinfecting solutions.

## 2021-02-08 NOTE — Patient Instructions (Addendum)
Recommend to proceed with the following vaccines at your pharmacy:   Flu shot this fall  Once better you are due for a repeat colonoscopy with Dr. Fuller Plan. Please call his office at [phone] to get on the schedule.

## 2021-02-09 NOTE — Assessment & Plan Note (Signed)
Thoracic pain: Pain as described above, seems to be a  mechanical issue, in the context of a history of prostate cancer with the last PSA below 0 and a recent neck surgery that is affecting his posture (see physical exam). He denies a rash, fever or chills.  No red flags. Overall I think this pain is postural.  Patient and his wife agrees with me. Plan: We agreed on observation,  if he is not gradually better he will let me know.  The patient and their wife agree. Cervical spinal stenosis: Status post C-spine corpectomy 01-2021

## 2021-02-11 DIAGNOSIS — I429 Cardiomyopathy, unspecified: Secondary | ICD-10-CM | POA: Diagnosis not present

## 2021-02-11 DIAGNOSIS — M4802 Spinal stenosis, cervical region: Secondary | ICD-10-CM | POA: Diagnosis not present

## 2021-02-11 DIAGNOSIS — Z8546 Personal history of malignant neoplasm of prostate: Secondary | ICD-10-CM | POA: Diagnosis not present

## 2021-02-11 DIAGNOSIS — Z7901 Long term (current) use of anticoagulants: Secondary | ICD-10-CM | POA: Diagnosis not present

## 2021-02-11 DIAGNOSIS — I10 Essential (primary) hypertension: Secondary | ICD-10-CM | POA: Diagnosis not present

## 2021-02-11 DIAGNOSIS — Z9181 History of falling: Secondary | ICD-10-CM | POA: Diagnosis not present

## 2021-02-11 DIAGNOSIS — Z4789 Encounter for other orthopedic aftercare: Secondary | ICD-10-CM | POA: Diagnosis not present

## 2021-02-11 DIAGNOSIS — M4712 Other spondylosis with myelopathy, cervical region: Secondary | ICD-10-CM | POA: Diagnosis not present

## 2021-02-11 DIAGNOSIS — I4891 Unspecified atrial fibrillation: Secondary | ICD-10-CM | POA: Diagnosis not present

## 2021-02-16 DIAGNOSIS — M4712 Other spondylosis with myelopathy, cervical region: Secondary | ICD-10-CM | POA: Diagnosis not present

## 2021-02-16 DIAGNOSIS — Z9181 History of falling: Secondary | ICD-10-CM | POA: Diagnosis not present

## 2021-02-16 DIAGNOSIS — I10 Essential (primary) hypertension: Secondary | ICD-10-CM | POA: Diagnosis not present

## 2021-02-16 DIAGNOSIS — M4802 Spinal stenosis, cervical region: Secondary | ICD-10-CM | POA: Diagnosis not present

## 2021-02-16 DIAGNOSIS — I4891 Unspecified atrial fibrillation: Secondary | ICD-10-CM | POA: Diagnosis not present

## 2021-02-16 DIAGNOSIS — Z8546 Personal history of malignant neoplasm of prostate: Secondary | ICD-10-CM | POA: Diagnosis not present

## 2021-02-16 DIAGNOSIS — I429 Cardiomyopathy, unspecified: Secondary | ICD-10-CM | POA: Diagnosis not present

## 2021-02-16 DIAGNOSIS — Z7901 Long term (current) use of anticoagulants: Secondary | ICD-10-CM | POA: Diagnosis not present

## 2021-02-16 DIAGNOSIS — Z4789 Encounter for other orthopedic aftercare: Secondary | ICD-10-CM | POA: Diagnosis not present

## 2021-02-17 ENCOUNTER — Ambulatory Visit (INDEPENDENT_AMBULATORY_CARE_PROVIDER_SITE_OTHER): Payer: Medicare HMO

## 2021-02-17 VITALS — Ht 73.0 in | Wt 252.0 lb

## 2021-02-17 DIAGNOSIS — Z0000 Encounter for general adult medical examination without abnormal findings: Secondary | ICD-10-CM | POA: Diagnosis not present

## 2021-02-17 NOTE — Patient Instructions (Signed)
David Smith , Thank you for taking time to complete your Medicare Wellness Visit. I appreciate your ongoing commitment to your health goals. Please review the following plan we discussed and let me know if I can assist you in the future.   Screening recommendations/referrals: Colonoscopy: Due-Please call GI when you are ready to schedule. Recommended yearly ophthalmology/optometry visit for glaucoma screening and checkup Recommended yearly dental visit for hygiene and checkup  Vaccinations: Influenza vaccine: Due-May obtain vaccine at our office or your local pharmacy. Pneumococcal vaccine: Up to date Tdap vaccine: Up to date-Due-11/22/2021 Shingles vaccine: Completed vaccines   Covid-19: Newest booster available at the pharmacy.  Advanced directives: Copy in chart  Conditions/risks identified: See problem list  Next appointment: Follow up in one year for your annual wellness visit 02/23/2022 @ 9:40.  Preventive Care 40-64 Years, Male Preventive care refers to lifestyle choices and visits with your health care provider that can promote health and wellness. What does preventive care include? A yearly physical exam. This is also called an annual well check. Dental exams once or twice a year. Routine eye exams. Ask your health care provider how often you should have your eyes checked. Personal lifestyle choices, including: Daily care of your teeth and gums. Regular physical activity. Eating a healthy diet. Avoiding tobacco and drug use. Limiting alcohol use. Practicing safe sex. Taking low-dose aspirin every day starting at age 47. What happens during an annual well check? The services and screenings done by your health care provider during your annual well check will depend on your age, overall health, lifestyle risk factors, and family history of disease. Counseling  Your health care provider may ask you questions about your: Alcohol use. Tobacco use. Drug use. Emotional  well-being. Home and relationship well-being. Sexual activity. Eating habits. Work and work Statistician. Screening  You may have the following tests or measurements: Height, weight, and BMI. Blood pressure. Lipid and cholesterol levels. These may be checked every 5 years, or more frequently if you are over 5 years old. Skin check. Lung cancer screening. You may have this screening every year starting at age 22 if you have a 30-pack-year history of smoking and currently smoke or have quit within the past 15 years. Fecal occult blood test (FOBT) of the stool. You may have this test every year starting at age 64. Flexible sigmoidoscopy or colonoscopy. You may have a sigmoidoscopy every 5 years or a colonoscopy every 10 years starting at age 2. Prostate cancer screening. Recommendations will vary depending on your family history and other risks. Hepatitis C blood test. Hepatitis B blood test. Sexually transmitted disease (STD) testing. Diabetes screening. This is done by checking your blood sugar (glucose) after you have not eaten for a while (fasting). You may have this done every 1-3 years. Discuss your test results, treatment options, and if necessary, the need for more tests with your health care provider. Vaccines  Your health care provider may recommend certain vaccines, such as: Influenza vaccine. This is recommended every year. Tetanus, diphtheria, and acellular pertussis (Tdap, Td) vaccine. You may need a Td booster every 10 years. Zoster vaccine. You may need this after age 23. Pneumococcal 13-valent conjugate (PCV13) vaccine. You may need this if you have certain conditions and have not been vaccinated. Pneumococcal polysaccharide (PPSV23) vaccine. You may need one or two doses if you smoke cigarettes or if you have certain conditions. Talk to your health care provider about which screenings and vaccines you need and how often you  need them. This information is not intended to  replace advice given to you by your health care provider. Make sure you discuss any questions you have with your health care provider. Document Released: 06/18/2015 Document Revised: 02/09/2016 Document Reviewed: 03/23/2015 Elsevier Interactive Patient Education  2017 [place] Prevention in the Home Falls can cause injuries. They can happen to people of all ages. There are many things you can do to make your home safe and to help prevent falls. What can I do on the outside of my home? Regularly fix the edges of walkways and driveways and fix any cracks. Remove anything that might make you trip as you walk through a door, such as a raised step or threshold. Trim any bushes or trees on the path to your home. Use bright outdoor lighting. Clear any walking paths of anything that might make someone trip, such as rocks or tools. Regularly check to see if handrails are loose or broken. Make sure that both sides of any steps have handrails. Any raised decks and porches should have guardrails on the edges. Have any leaves, snow, or ice cleared regularly. Use sand or salt on walking paths during winter. Clean up any spills in your garage right away. This includes oil or grease spills. What can I do in the bathroom? Use night lights. Install grab bars by the toilet and in the tub and shower. Do not use towel bars as grab bars. Use non-skid mats or decals in the tub or shower. If you need to sit down in the shower, use a plastic, non-slip stool. Keep the floor dry. Clean up any water that spills on the floor as soon as it happens. Remove soap buildup in the tub or shower regularly. Attach bath mats securely with double-sided non-slip rug tape. Do not have throw rugs and other things on the floor that can make you trip. What can I do in the bedroom? Use night lights. Make sure that you have a light by your bed that is easy to reach. Do not use any sheets or blankets that are too big for  your bed. They should not hang down onto the floor. Have a firm chair that has side arms. You can use this for support while you get dressed. Do not have throw rugs and other things on the floor that can make you trip. What can I do in the kitchen? Clean up any spills right away. Avoid walking on wet floors. Keep items that you use a lot in easy-to-reach places. If you need to reach something above you, use a strong step stool that has a grab bar. Keep electrical cords out of the way. Do not use floor polish or wax that makes floors slippery. If you must use wax, use non-skid floor wax. Do not have throw rugs and other things on the floor that can make you trip. What can I do with my stairs? Do not leave any items on the stairs. Make sure that there are handrails on both sides of the stairs and use them. Fix handrails that are broken or loose. Make sure that handrails are as long as the stairways. Check any carpeting to make sure that it is firmly attached to the stairs. Fix any carpet that is loose or worn. Avoid having throw rugs at the top or bottom of the stairs. If you do have throw rugs, attach them to the floor with carpet tape. Make sure that you have a light switch  at the top of the stairs and the bottom of the stairs. If you do not have them, ask someone to add them for you. What else can I do to help prevent falls? Wear shoes that: Do not have high heels. Have rubber bottoms. Are comfortable and fit you well. Are closed at the toe. Do not wear sandals. If you use a stepladder: Make sure that it is fully opened. Do not climb a closed stepladder. Make sure that both sides of the stepladder are locked into place. Ask someone to hold it for you, if possible. Clearly mark and make sure that you can see: Any grab bars or handrails. First and last steps. Where the edge of each step is. Use tools that help you move around (mobility aids) if they are needed. These  include: Canes. Walkers. Scooters. Crutches. Turn on the lights when you go into a dark area. Replace any light bulbs as soon as they burn out. Set up your furniture so you have a clear path. Avoid moving your furniture around. If any of your floors are uneven, fix them. If there are any pets around you, be aware of where they are. Review your medicines with your doctor. Some medicines can make you feel dizzy. This can increase your chance of falling. Ask your doctor what other things that you can do to help prevent falls. This information is not intended to replace advice given to you by your health care provider. Make sure you discuss any questions you have with your health care provider. Document Released: 03/18/2009 Document Revised: 10/28/2015 Document Reviewed: 06/26/2014 Elsevier Interactive Patient Education  2017 [employer].

## 2021-02-17 NOTE — Progress Notes (Addendum)
Subjective:   David Smith is a 65 y.o. male who presents for Medicare Annual/Subsequent preventive examination.  I connected with Zhayden today by telephone and verified that I am speaking with the correct person using two identifiers. Location patient: home Location provider: work Persons participating in the virtual visit: patient, Marine scientist.    I discussed the limitations, risks, security and privacy concerns of performing an evaluation and management service by telephone and the availability of in person appointments. I also discussed with the patient that there may be a patient responsible charge related to this service. The patient expressed understanding and verbally consented to this telephonic visit.    Interactive audio and video telecommunications were attempted between this provider and patient, however failed, due to patient having technical difficulties OR patient did not have access to video capability.  We continued and completed visit with audio only.  Some vital signs may be absent or patient reported.   Time Spent with patient on telephone encounter: 25 minutes   Review of Systems     Cardiac Risk Factors include: advanced age (>66mn, >>47women);hypertension;dyslipidemia;obesity (BMI >30kg/m2);male gender     Objective:    Today's Vitals   02/17/21 0940  Weight: 252 lb (114.3 kg)  Height: '6\' 1"'$  (1.854 m)  PainSc: 2    Body mass index is 33.25 kg/m.  Advanced Directives 02/17/2021 01/05/2021 12/29/2020 08/10/2020 03/02/2020 08/09/2015 07/13/2015  Does Patient Have a Medical Advance Directive? Yes Yes Yes Yes Yes No Yes  Type of AP[place]amed[place]of [place] will Healthcare Power of [place]row[place]of [place]ose Hill Acres[place] will Living will - Living will;Healthcare Power of Attorney  Does patient want to make changes to medical advance directive? - No - Patient declined No - Patient declined - - - -  Copy of  [place] Chart? Yes - validated most recent copy scanned in chart (See row information) - Yes - validated most recent copy scanned in chart (See row information) - - - -  Would patient like information on creating a medical advance directive? - - - - - No - patient declined information -  Pre-existing out of facility DNR order (yellow form or pink MOST form) - - - - - - -    Current Medications (verified) Outpatient Encounter Medications as of 02/17/2021  Medication Sig   apixaban (ELIQUIS) 5 MG TABS tablet Take 1 tablet (5 mg total) by mouth 2 (two) times daily.   Ascorbic Acid (VITAMIN C PO) Take 1 tablet by mouth 3 (three) times a week.   diltiazem (CARDIZEM CD) 240 MG 24 hr capsule Take 1 capsule (240 mg total) by mouth daily.   Multiple Vitamin (MULTIVITAMIN WITH MINERALS) TABS tablet Take 1 tablet by mouth daily.   Omega-3 Fatty Acids (FISH OIL PO) Take 1 capsule by mouth once a week.   rosuvastatin (CRESTOR) 20 MG tablet Take 20 mg by mouth daily.   tiZANidine (ZANAFLEX) 4 MG tablet Take 1 tablet (4 mg total) by mouth every 6 (six) hours as needed for muscle spasms.   Cyanocobalamin (VITAMIN B 12 PO) Take 1 tablet by mouth 3 (three) times a week.   leuprolide (LUPRON) 30 MG injection Inject 60 mg into the muscle every 6 (six) months.  (Patient not taking: No sig reported)   meclizine (ANTIVERT) 25 MG tablet Take 1 tablet (25 mg total) by mouth 3 (three) times daily as needed for dizziness. (Patient not taking: No  sig reported)   No facility-administered encounter medications on file as of 02/17/2021.    Allergies (verified) Patient has no known allergies.   History: Past Medical History:  Diagnosis Date   Chronic lower back pain 06/11/2012   daily a problem Pain level 7, Tylenol helpful(down to level 2)   Depression    Due to med for his cancer - Lupron   H/O cardiovascular stress test    non ischemic EF 55% 05-15-2007   Heart murmur    dx as a teen,  Stress ECHO 2008 (-)    Hypertension    Hypertrophic obstructive cardiomyopathy (HOCM) (HCC)    PAF (paroxysmal atrial fibrillation) ([place])    Prostate cancer metastatic to intrapelvic lymph node (HCC)    gleason 4+3=7, volume 56.4 cc, PSA 34.25   Past Surgical History:  Procedure Laterality Date   ACHILLES TENDON REPAIR     26 yrs ago, right foot   ANAL FISSURECTOMY  2008   ANTERIOR CERVICAL DECOMP/DISCECTOMY FUSION N/A 01/05/2021   Procedure: Cervical Three Corpectomy;  Surgeon: Ashok Pall, MD;  Location: [facility];  Service: Neurosurgery;  Laterality: N/A;   dental procedure  06-11-12   minor -office procedure to correct jaw issue-used anesthesia gas to do   LYMPHADENECTOMY  06/17/2012   Procedure: LYMPHADENECTOMY;  Surgeon: [name], MD;  Location: [facility];  Service: Urology;  Laterality: Bilateral;   PROSTATE BIOPSY     ROBOT ASSISTED LAPAROSCOPIC RADICAL PROSTATECTOMY  06/17/2012   Procedure: ROBOTIC ASSISTED LAPAROSCOPIC RADICAL PROSTATECTOMY LEVEL 2;  Surgeon: [name], MD;  Location: [facility];  Service: Urology;  Laterality: N/A;      Family History  Problem Relation Age of Onset   Cancer Mother        breast   Heart disease Mother    Cancer Father 44       prostate, removed   Cancer Brother 50       prostate, tx w/xrt   Colon cancer Neg Hx    Colon polyps Neg Hx    Social History   Socioeconomic History   Marital status: Married    Spouse name: Not on file   Number of children: 3   Years of education: Not on file   Highest education level: Not on file  Occupational History   Occupation: disability d/t back pain-not working at present  Tobacco Use   Smoking status: Never   Smokeless tobacco: Never  Vaping Use   Vaping Use: Never used  Substance and Sexual Activity   Alcohol use: Not Currently    Alcohol/week: 0.0 standard drinks   Drug use: No   Sexual activity: Yes  Other Topics Concern   Not on file  Social History Narrative   Household: pt, wife 18 y/o  child    Right handed   Social Determinants of Health   Financial Resource Strain: Low Risk    Difficulty of Paying Living Expenses: Not very hard  Food Insecurity: No Food Insecurity   Worried About Charity fundraiser in the Last Year: Never true   Mower in the Last Year: Never true  Transportation Needs: No Transportation Needs   Lack of Transportation (Medical): No   Lack of Transportation (Non-Medical): No  Physical Activity: Sufficiently Active   Days of Exercise per Week: 7 days   Minutes of Exercise per Session: 30 min  Stress: Stress Concern Present   Feeling of Stress : To some extent  Social Connections: Moderately  Isolated   Frequency of Communication with Friends and Family: Once a week   Frequency of Social Gatherings with Friends and Family: Once a week   Attends Religious Services: More than 4 times per year   Active Member of [employer] or Organizations: No   Attends Music therapist: Never   Marital Status: Married    Tobacco Counseling Counseling given: Not Answered   Clinical Intake:  Pre-visit preparation completed: Yes  Pain : 0-10 Pain Score: 2  Pain Type: Acute pain Pain Location: Back Pain Orientation: Right Pain Onset: 1 to 4 weeks ago Pain Frequency: Intermittent     BMI - recorded: 33.25 Nutritional Status: BMI > 30  Obese Nutritional Risks: None Diabetes: No  How often do you need to have someone help you when you read instructions, pamphlets, or other written materials from your doctor or pharmacy?: 1 - Never  Diabetic?No  Interpreter Needed?: No  Information entered by :: Caroleen Hamman LPN   Activities of Daily Living In your present state of health, do you have any difficulty performing the following activities: 02/17/2021 12/29/2020  Hearing? N N  Vision? N N  Difficulty concentrating or making decisions? N N  Walking or climbing stairs? Y N  Comment due to recent surgery -  Dressing or bathing? N N   Doing errands, shopping? N N  Preparing Food and eating ? N -  Using the Toilet? N -  In the past six months, have you accidently leaked urine? Y -  Comment occasionally -  Do you have problems with loss of bowel control? N -  Managing your Medications? N -  Managing your Finances? N -  Housekeeping or managing your Housekeeping? N -  Some recent data might be hidden    Patient Care Team: Colon Branch, MD as PCP - General (Internal Medicine) O'Neal, Cassie Freer, MD as PCP - Cardiology (Internal Medicine) Raynelle Bring, MD as Consulting Physician (Urology) Pieter Partridge, DO as Consulting Physician (Neurology)  Indicate any recent Medical Services you may have received from other than [facility] providers in the past year (date may be approximate).     Assessment:   This is a routine wellness examination for [place].  Hearing/Vision screen Vision Screening - Comments:: Last eye exam-3 years ago  Dietary issues and exercise activities discussed: Current Exercise Habits: Structured exercise class (physical therapy), Type of exercise: strength training/weights, Time (Minutes): 30, Frequency (Times/Week): 7, Weekly Exercise (Minutes/Week): 210, Intensity: Mild, Exercise limited by: orthopedic condition(s)   Goals Addressed             This Visit's Progress    Patient Stated       Eat more vegetables & increase activity as tolerated       Depression Screen PHQ 2/9 Scores 02/17/2021 12/17/2020 08/17/2020 05/07/2019 07/23/2017 03/27/2016 01/10/2016  PHQ - 2 Score 1 4 0 0 0 0 0  PHQ- 9 Score - 6 - - - - -    Fall Risk Fall Risk  02/17/2021 12/17/2020 05/07/2019 07/23/2017 03/27/2016  Falls in the past year? 0 0 0 No No  Number falls in past yr: 0 0 - - -  Injury with Fall? 0 0 - - -  Follow up Falls prevention discussed Falls evaluation completed Falls evaluation completed - -    FALL RISK PREVENTION PERTAINING TO THE HOME:  Any stairs in or around the home? Yes  If so, are there  any without handrails? No  Home free  of loose throw rugs in walkways, pet beds, electrical cords, etc? Yes  Adequate lighting in your home to reduce risk of falls? Yes   ASSISTIVE DEVICES UTILIZED TO PREVENT FALLS:  Life alert? No  Use of a cane, walker or w/c? Yes  Grab bars in the bathroom? Yes  Shower chair or bench in shower? No  Elevated toilet seat or a handicapped toilet? No   TIMED UP AND GO:  Was the test performed? No . Phone visit   Cognitive Function:Normal cognitive status assessed by  this Nurse Health Advisor. No abnormalities found.          Immunizations Immunization History  Administered Date(s) Administered   Influenza,inj,Quad PF,6+ Mos 06/28/2015   Influenza-Unspecified 03/05/2017, 04/05/2018, 04/06/2019, 03/26/2020   PFIZER(Purple Top)SARS-COV-2 Vaccination 08/21/2019, 09/11/2019, 03/26/2020, 09/29/2020   Pneumococcal Polysaccharide-23 05/07/2019   Tdap 11/23/2011   Zoster Recombinat (Shingrix) 06/29/2020    TDAP status: Up to date  Flu Vaccine status: Due, Education has been provided regarding the importance of this vaccine. Advised may receive this vaccine at local pharmacy or Health Dept. Aware to provide a copy of the vaccination record if obtained from local pharmacy or Health Dept. Verbalized acceptance and understanding.  Pneumococcal vaccine status: Up to date  Covid-19 vaccine status: Completed vaccines  Qualifies for Shingles Vaccine? No   Zostavax completed No   Shingrix Completed?: Yes  Screening Tests Health Maintenance  Topic Date Due   Pneumococcal Vaccine 10-53 Years old (2 - PCV) 05/06/2020   COLONOSCOPY (Pts 45-40yr Insurance coverage will need to be confirmed)  07/26/2020   Zoster Vaccines- Shingrix (2 of 2) 08/24/2020   INFLUENZA VACCINE  01/03/2021   COVID-19 Vaccine (5 - Booster for Pfizer series) 01/29/2021   TETANUS/TDAP  11/22/2021   Hepatitis C Screening  Completed   HIV Screening  Completed   HPV VACCINES  Aged  Out    Health Maintenance  Health Maintenance Due  Topic Date Due   Pneumococcal Vaccine 025628Years old (2 - PCV) 05/06/2020   COLONOSCOPY (Pts 45-474yrInsurance coverage will need to be confirmed)  07/26/2020   Zoster Vaccines- Shingrix (2 of 2) 08/24/2020   INFLUENZA VACCINE  01/03/2021   COVID-19 Vaccine (5 - Booster for Pfizer series) 01/29/2021    Colorectal cancer screening: Due-Patient plans to call GI to schedule once he has recovered from his recent surgery.  Lung Cancer Screening: (Low Dose CT Chest recommended if Age 63-80ears, 30 pack-year currently smoking OR have quit w/in 15years.) does not qualify.     Additional Screening:  Hepatitis C Screening: Completed 09/12/2016  Vision Screening: Recommended annual ophthalmology exams for early detection of glaucoma and other disorders of the eye. Is the patient up to date with their annual eye exam?  No  Patient plans to make an appt once he recovers from surgery.  Dental Screening: Recommended annual dental exams for proper oral hygiene  Community Resource Referral / Chronic Care Management: CRR required this visit?  No   CCM required this visit?  No      Plan:     I have personally reviewed and noted the following in the patient's chart:   Medical and social history Use of alcohol, tobacco or illicit drugs  Current medications and supplements including opioid prescriptions. Patient is not currently taking opioid prescriptions. Functional ability and status Nutritional status Physical activity Advanced directives List of other physicians Hospitalizations, surgeries, and ER visits in previous 12 months Vitals Screenings to include cognitive, depression,  and falls Referrals and appointments  In addition, I have reviewed and discussed with patient certain preventive protocols, quality metrics, and best practice recommendations. A written personalized care plan for preventive services as well as general  preventive health recommendations were provided to patient.   Due to this being a telephonic visit, the after visit summary with patients personalized plan was offered to patient via mail or my-chart. Patient would like to access on my-chart.   Marta Antu, LPN   [id number]  Nurse Health Advisor  Nurse Notes: None   I have reviewed and agree with Health Coaches documentation.  [name], MD

## 2021-02-23 DIAGNOSIS — M4712 Other spondylosis with myelopathy, cervical region: Secondary | ICD-10-CM | POA: Diagnosis not present

## 2021-02-23 DIAGNOSIS — Z9181 History of falling: Secondary | ICD-10-CM | POA: Diagnosis not present

## 2021-02-23 DIAGNOSIS — Z4789 Encounter for other orthopedic aftercare: Secondary | ICD-10-CM | POA: Diagnosis not present

## 2021-02-23 DIAGNOSIS — Z7901 Long term (current) use of anticoagulants: Secondary | ICD-10-CM | POA: Diagnosis not present

## 2021-02-23 DIAGNOSIS — I10 Essential (primary) hypertension: Secondary | ICD-10-CM | POA: Diagnosis not present

## 2021-02-23 DIAGNOSIS — I4891 Unspecified atrial fibrillation: Secondary | ICD-10-CM | POA: Diagnosis not present

## 2021-02-23 DIAGNOSIS — M4802 Spinal stenosis, cervical region: Secondary | ICD-10-CM | POA: Diagnosis not present

## 2021-02-23 DIAGNOSIS — Z8546 Personal history of malignant neoplasm of prostate: Secondary | ICD-10-CM | POA: Diagnosis not present

## 2021-02-23 DIAGNOSIS — I429 Cardiomyopathy, unspecified: Secondary | ICD-10-CM | POA: Diagnosis not present

## 2021-03-01 ENCOUNTER — Telehealth: Payer: Self-pay | Admitting: Cardiovascular Disease

## 2021-03-01 NOTE — Telephone Encounter (Signed)
Pt c/o BP issue: STAT if pt c/o blurred vision, one-sided weakness or slurred speech  1. What are your last 5 BP readings? 161/93  2. Are you having any other symptoms (ex. Dizziness, headache, blurred vision, passed out)? Headache, nausea   3. What is your BP issue? High   [place] spoke with the patient earlier but will like for [place] to give the patient a call back directly

## 2021-03-01 NOTE — Telephone Encounter (Signed)
Attempt to call patient x 2-call cannot be completed at this time.

## 2021-03-04 DIAGNOSIS — Z9181 History of falling: Secondary | ICD-10-CM | POA: Diagnosis not present

## 2021-03-04 DIAGNOSIS — M4712 Other spondylosis with myelopathy, cervical region: Secondary | ICD-10-CM | POA: Diagnosis not present

## 2021-03-04 DIAGNOSIS — Z7901 Long term (current) use of anticoagulants: Secondary | ICD-10-CM | POA: Diagnosis not present

## 2021-03-04 DIAGNOSIS — M4802 Spinal stenosis, cervical region: Secondary | ICD-10-CM | POA: Diagnosis not present

## 2021-03-04 DIAGNOSIS — I4891 Unspecified atrial fibrillation: Secondary | ICD-10-CM | POA: Diagnosis not present

## 2021-03-04 DIAGNOSIS — Z8546 Personal history of malignant neoplasm of prostate: Secondary | ICD-10-CM | POA: Diagnosis not present

## 2021-03-04 DIAGNOSIS — Z4789 Encounter for other orthopedic aftercare: Secondary | ICD-10-CM | POA: Diagnosis not present

## 2021-03-04 DIAGNOSIS — I429 Cardiomyopathy, unspecified: Secondary | ICD-10-CM | POA: Diagnosis not present

## 2021-03-04 DIAGNOSIS — I10 Essential (primary) hypertension: Secondary | ICD-10-CM | POA: Diagnosis not present

## 2021-03-04 NOTE — Telephone Encounter (Signed)
David Smith ( Physical therapist ) last day visiting the patient.   Noted elevated blood pressure 180/110. Per David Smith ,blood pressure was about the same with  patient's  machine and Jim's machine.   Per David Smith, patient had a taken medications about an hour ago.  No other symptoms today .   Blood pressure reading obtained  02/23/21- 150/86 02/16/21- 150/88 02/11/21 -  162/90 02/04/21 -  176/100   ON 03/01/21 blood pressure was 161/93 ,  a call was placed to the office .  2 attempts were made with no response.   Aware will defer to Dr Audie Box for further  instructions   Patient can be reached by  mobile number or  wife's cell  [phone]

## 2021-03-04 NOTE — Telephone Encounter (Signed)
Pt c/o BP issue: STAT if pt c/o blurred vision, one-sided weakness or slurred speech  1. What are your last 5 BP readings?  03/04/21 180/110  2. Are you having any other symptoms (ex. Dizziness, headache, blurred vision, passed out)? No   3. What is your BP issue? Hypertension

## 2021-03-07 ENCOUNTER — Ambulatory Visit (INDEPENDENT_AMBULATORY_CARE_PROVIDER_SITE_OTHER): Payer: Medicare HMO | Admitting: Otolaryngology

## 2021-03-07 NOTE — Telephone Encounter (Signed)
Called patient, LVM advising of message below.  Left call back number.

## 2021-03-21 ENCOUNTER — Ambulatory Visit (INDEPENDENT_AMBULATORY_CARE_PROVIDER_SITE_OTHER): Payer: Medicare HMO | Admitting: Otolaryngology

## 2021-03-21 ENCOUNTER — Other Ambulatory Visit: Payer: Self-pay

## 2021-03-21 DIAGNOSIS — R42 Dizziness and giddiness: Secondary | ICD-10-CM | POA: Diagnosis not present

## 2021-03-21 NOTE — Progress Notes (Signed)
HPI: David Smith is a 65 y.o. male who presents is referred by his PCP for evaluation of "vertigo".  Patient states that he has had problems with vertigo for over 30 years.  Used to be mild but over the past year it has gotten much more severe.  He recently underwent surgery for spinal stenosis 2 months ago and since his surgery he feels like the "vertigo" has been doing much better.  He states that he has dizzy spells on a daily basis that may last for 10 to 15 minutes.  He has previously seen neurology.  Who performed a brain MRI in March of this year that was normal. He question as to whether he possibly had "crystals" in his inner ear as he has heard about this causing vertigo. He states that he has "vertigo almost every day and the meclizine seems to help.  He does not really have dizziness or vertigo and night while he is in bed.  Occurs more often when he is active.  Past Medical History:  Diagnosis Date   Chronic lower back pain 06/11/2012   daily a problem Pain level 7, Tylenol helpful(down to level 2)   Depression    Due to med for his cancer - Lupron   H/O cardiovascular stress test    non ischemic EF 55% 05-15-2007   Heart murmur    dx as a teen, Stress ECHO 2008 (-)    Hypertension    Hypertrophic obstructive cardiomyopathy (HOCM) (HCC)    PAF (paroxysmal atrial fibrillation) ([place])    Prostate cancer metastatic to intrapelvic lymph node (HCC)    gleason 4+3=7, volume 56.4 cc, PSA 34.25   Past Surgical History:  Procedure Laterality Date   ACHILLES TENDON REPAIR     26 yrs ago, right foot   ANAL FISSURECTOMY  2008   ANTERIOR CERVICAL DECOMP/DISCECTOMY FUSION N/A 01/05/2021   Procedure: Cervical Three Corpectomy;  Surgeon: Ashok Pall, MD;  Location: [facility];  Service: Neurosurgery;  Laterality: N/A;   dental procedure  06-11-12   minor -office procedure to correct jaw issue-used anesthesia gas to do   LYMPHADENECTOMY  06/17/2012   Procedure: LYMPHADENECTOMY;  Surgeon: [name], MD;  [facility];  Service: Urology;  Laterality: Bilateral;   PROSTATE BIOPSY     ROBOT ASSISTED LAPAROSCOPIC RADICAL PROSTATECTOMY  06/17/2012   Procedure: ROBOTIC ASSISTED LAPAROSCOPIC RADICAL PROSTATECTOMY LEVEL 2;  Surgeon: [name], MD;  Location[facility];  Service: Urology;  Laterality: N/A;      Social History   Socioeconomic History   Marital status: Married    Spouse name: Not on file   Number of children: 3   Years of education: Not on file   Highest education level: Not on file  Occupational History   Occupation: disability d/t back pain-not working at present  Tobacco Use   Smoking status: Never   Smokeless tobacco: Never  Vaping Use   Vaping Use: Never used  Substance and Sexual Activity   Alcohol use: Not Currently    Alcohol/week: 0.0 standard drinks   Drug use: No   Sexual activity: Yes  Other Topics Concern   Not on file  Social History Narrative   Household: pt, wife 1 y/o child    Right handed   Social Determinants of Health   Financial Resource Strain: Low Risk    Difficulty of Paying Living Expenses: Not very hard  Food Insecurity: No Food Insecurity   Worried About Running Out of  Food in the Last Year: Never true   [place] in the Last Year: Never true  Transportation Needs: No Transportation Needs   Lack of Transportation (Medical): No   Lack of Transportation (Non-Medical): No  Physical Activity: Sufficiently Active   Days of Exercise per Week: 7 days   Minutes of Exercise per Session: 30 min  Stress: Stress Concern Present   Feeling of Stress : To some extent  Social Connections: Moderately Isolated   Frequency of Communication with Friends and Family: Once a week   Frequency of Social Gatherings with Friends and Family: Once a week   Attends Religious Services: More than 4 times per year   Active Member of [employer] or Organizations: No   Attends Music therapist: Never   Marital Status: Married   Family  History  Problem Relation Age of Onset   Cancer Mother        breast   Heart disease Mother    Cancer Father 91       prostate, removed   Cancer Brother 30       prostate, tx w/xrt   Colon cancer Neg Hx    Colon polyps Neg Hx    No Known Allergies Prior to Admission medications   Medication Sig Start Date End Date Taking? Authorizing Provider  apixaban (ELIQUIS) 5 MG TABS tablet Take 1 tablet (5 mg total) by mouth 2 (two) times daily. 03/31/20   O'NealCassie Freer, MD  Ascorbic Acid (VITAMIN C PO) Take 1 tablet by mouth 3 (three) times a week.    [provider]  Cyanocobalamin (VITAMIN B 12 PO) Take 1 tablet by mouth 3 (three) times a week.    [provider]  diltiazem (CARDIZEM CD) 240 MG 24 hr capsule Take 1 capsule (240 mg total) by mouth daily. 03/31/20   O'NealCassie Freer, MD  leuprolide (LUPRON) 30 MG injection Inject 60 mg into the muscle every 6 (six) months.  Patient not taking: No sig reported    Raynelle Bring, MD  meclizine (ANTIVERT) 25 MG tablet Take 1 tablet (25 mg total) by mouth 3 (three) times daily as needed for dizziness. Patient not taking: No sig reported 08/17/20   Colon Branch, MD  Multiple Vitamin (MULTIVITAMIN WITH MINERALS) TABS tablet Take 1 tablet by mouth daily.    [provider]  Omega-3 Fatty Acids (FISH OIL PO) Take 1 capsule by mouth once a week.    [provider]  rosuvastatin (CRESTOR) 20 MG tablet Take 20 mg by mouth daily.    [provider]  tiZANidine (ZANAFLEX) 4 MG tablet Take 1 tablet (4 mg total) by mouth every 6 (six) hours as needed for muscle spasms. 01/07/21   Ashok Pall, MD     Positive ROS: Otherwise negative  All other systems have been reviewed and were otherwise negative with the exception of those mentioned in the HPI and as above.  Physical Exam: Constitutional: Alert, well-appearing, no acute distress Ears: External ears without lesions or tenderness. Ear canals are clear  bilaterally with intact, clear TMs bilaterally.  Hearing screening with the tuning forks revealed symmetric hearing in both ears with good hearing in both ears subjectively with a 1024 tuning fork.  Dix-Hallpike testing revealed no clinical evidence of BPPV, vertigo or nystagmus although he does have limited neck motion secondary to recent surgery. Nasal: External nose without lesions. Clear nasal passages Oral: Lips and gums without lesions. Tongue and palate mucosa  without lesions. Posterior oropharynx clear. Neck: No palpable adenopathy or masses Respiratory: Breathing comfortably  Skin: No facial/neck lesions or rash noted.  Procedures  Assessment: History of dizziness questionable etiology.  No ear or inner ear abnormality noted on clinical exam today.  Plan: Would recommend recovery from his surgery for spinal stenosis and if he continues to have problems with dizziness or vertigo consider further treatment with vestibular rehab. Reviewed with him today that I will be retiring in 2 weeks and that if he needs vestibular rehab scheduled he can obtain this through his neurologist.   Radene Journey, MD   CC:

## 2021-03-30 DIAGNOSIS — H40033 Anatomical narrow angle, bilateral: Secondary | ICD-10-CM | POA: Diagnosis not present

## 2021-03-30 DIAGNOSIS — H2513 Age-related nuclear cataract, bilateral: Secondary | ICD-10-CM | POA: Diagnosis not present

## 2021-04-05 DIAGNOSIS — M4802 Spinal stenosis, cervical region: Secondary | ICD-10-CM | POA: Diagnosis not present

## 2021-04-06 ENCOUNTER — Other Ambulatory Visit: Payer: Self-pay | Admitting: Cardiovascular Disease

## 2021-04-07 DIAGNOSIS — C61 Malignant neoplasm of prostate: Secondary | ICD-10-CM | POA: Diagnosis not present

## 2021-04-07 NOTE — Telephone Encounter (Signed)
Prescription refill request for Eliquis received. Indication:A fib Last office visit:1/22 Scr:1.3 Age: 65 Weight:114.3 kg  Prescription refilled

## 2021-04-20 ENCOUNTER — Encounter: Payer: Self-pay | Admitting: Internal Medicine

## 2021-04-20 ENCOUNTER — Ambulatory Visit: Payer: Medicare HMO | Admitting: Internal Medicine

## 2021-05-18 DIAGNOSIS — C775 Secondary and unspecified malignant neoplasm of intrapelvic lymph nodes: Secondary | ICD-10-CM | POA: Diagnosis not present

## 2021-05-18 DIAGNOSIS — C61 Malignant neoplasm of prostate: Secondary | ICD-10-CM | POA: Diagnosis not present

## 2021-05-25 ENCOUNTER — Other Ambulatory Visit: Payer: Self-pay | Admitting: Cardiovascular Disease

## 2021-07-29 ENCOUNTER — Encounter: Payer: Self-pay | Admitting: Internal Medicine

## 2021-08-17 DIAGNOSIS — C61 Malignant neoplasm of prostate: Secondary | ICD-10-CM | POA: Diagnosis not present

## 2021-09-05 DIAGNOSIS — I10 Essential (primary) hypertension: Secondary | ICD-10-CM | POA: Diagnosis not present

## 2021-09-05 DIAGNOSIS — Z6834 Body mass index (BMI) 34.0-34.9, adult: Secondary | ICD-10-CM | POA: Diagnosis not present

## 2021-09-05 DIAGNOSIS — M4802 Spinal stenosis, cervical region: Secondary | ICD-10-CM | POA: Diagnosis not present

## 2021-11-09 DIAGNOSIS — C61 Malignant neoplasm of prostate: Secondary | ICD-10-CM | POA: Diagnosis not present

## 2021-11-09 LAB — PSA: PSA: 0.13

## 2021-11-21 ENCOUNTER — Telehealth: Payer: Self-pay | Admitting: Internal Medicine

## 2021-11-21 NOTE — Telephone Encounter (Signed)
Pt states he just lost his wife last Wednesday. He has been struggling with anxiety and was wondering if something could be prescribed. Pt was very choked up the entire phone call.   [facility] ([place]), [place] - [address]   [address] [place] [address]  Phone:  [phone]  Fax:  [phone]

## 2021-11-21 NOTE — Telephone Encounter (Signed)
Patient declined visit and advised his provider was out the office.  He will await PCP return and I did advise that this may warrant an OV.

## 2021-11-22 ENCOUNTER — Other Ambulatory Visit: Payer: Self-pay | Admitting: Cardiovascular Disease

## 2021-11-22 MED ORDER — ALPRAZOLAM 0.25 MG PO TABS: 0.2500 mg | ORAL_TABLET | Freq: Two times a day (BID) | ORAL | 0 refills | Status: AC | PRN

## 2021-11-22 NOTE — Telephone Encounter (Signed)
I am so sorry for his loss. Advise patient: I sent a prescription for Xanax 0.25 mg: Take 1 or 2 tablets BID PRN  for severe anxiety. Definitely watch for excessive sedation, also tell him his risk for fall increases with this medication.  Hopefully he will needed only for short time. He is due for a office visit please arrange at his convenience.

## 2021-11-22 NOTE — Telephone Encounter (Signed)
Patient notified about note below.  He stated that he will call back to schedule follow up appointment.

## 2021-12-09 DIAGNOSIS — C775 Secondary and unspecified malignant neoplasm of intrapelvic lymph nodes: Secondary | ICD-10-CM | POA: Diagnosis not present

## 2021-12-09 DIAGNOSIS — C61 Malignant neoplasm of prostate: Secondary | ICD-10-CM | POA: Diagnosis not present

## 2021-12-13 ENCOUNTER — Encounter: Payer: Self-pay | Admitting: Internal Medicine

## 2021-12-29 ENCOUNTER — Encounter: Payer: Self-pay | Admitting: Internal Medicine

## 2022-01-05 ENCOUNTER — Other Ambulatory Visit: Payer: Self-pay | Admitting: Cardiovascular Disease

## 2022-01-06 ENCOUNTER — Other Ambulatory Visit: Payer: Self-pay | Admitting: Cardiovascular Disease

## 2022-01-07 ENCOUNTER — Other Ambulatory Visit: Payer: Self-pay | Admitting: Cardiovascular Disease

## 2022-01-09 DIAGNOSIS — Z6833 Body mass index (BMI) 33.0-33.9, adult: Secondary | ICD-10-CM | POA: Diagnosis not present

## 2022-01-09 DIAGNOSIS — M4802 Spinal stenosis, cervical region: Secondary | ICD-10-CM | POA: Diagnosis not present

## 2022-01-25 ENCOUNTER — Other Ambulatory Visit: Payer: Self-pay | Admitting: Cardiovascular Disease

## 2022-01-25 NOTE — Telephone Encounter (Signed)
Prescription refill request for Eliquis received. Indication: PAF Last office visit: 07/02/20  Dorann Lodge MD Scr: 1.34 on 12/17/20 Age: 66 Weight: 119.8kg  Based on above findings Eliquis '5mg'$  twice daily is the appropriate dose.  Pt is past due for MD appt and labs.  Message sent to schedulers to make appt.  Refill approved x 1 only.

## 2022-02-02 ENCOUNTER — Telehealth: Payer: Self-pay | Admitting: Cardiovascular Disease

## 2022-02-02 NOTE — Telephone Encounter (Signed)
*  STAT* If patient is at the pharmacy, call can be transferred to refill team.   1. Which medications need to be refilled? (please list name of each medication and dose if known) diltiazem (CARDIZEM CD) 240 MG 24 hr capsule; rosuvastatin (CRESTOR) 20 MG tablet  2. Which pharmacy/location (including street and city if local pharmacy) is medication to be sent to? [place] ([place] - [address]  3. Do they need a 30 day or 90 day supply? 90 day for both medications

## 2022-02-03 MED ORDER — DILTIAZEM HCL ER COATED BEADS 240 MG PO CP24: 240.0000 mg | ORAL_CAPSULE | Freq: Every day | ORAL | 0 refills | Status: DC

## 2022-02-08 DIAGNOSIS — M545 Low back pain, unspecified: Secondary | ICD-10-CM | POA: Diagnosis not present

## 2022-02-17 DIAGNOSIS — M545 Low back pain, unspecified: Secondary | ICD-10-CM | POA: Diagnosis not present

## 2022-02-20 ENCOUNTER — Other Ambulatory Visit: Payer: Self-pay | Admitting: Cardiovascular Disease

## 2022-02-22 DIAGNOSIS — M545 Low back pain, unspecified: Secondary | ICD-10-CM | POA: Diagnosis not present

## 2022-02-23 ENCOUNTER — Ambulatory Visit (INDEPENDENT_AMBULATORY_CARE_PROVIDER_SITE_OTHER): Payer: Medicare HMO | Admitting: *Deleted

## 2022-02-23 DIAGNOSIS — Z0000 Encounter for general adult medical examination without abnormal findings: Secondary | ICD-10-CM | POA: Diagnosis not present

## 2022-02-23 NOTE — Patient Instructions (Signed)
David Smith , Thank you for taking time to come for your Medicare Wellness Visit. I appreciate your ongoing commitment to your health goals. Please review the following plan we discussed and let me know if I can assist you in the future.   These are the goals we discussed:  Goals      Patient Stated     Eat more vegetables & increase activity as tolerated        This is a list of the screening recommended for you and due dates:  Health Maintenance  Topic Date Due   Colon Cancer Screening  07/26/2020   Zoster (Shingles) Vaccine (2 of 2) 08/24/2020   COVID-19 Vaccine (6 - Pfizer risk series) 07/12/2021   Tetanus Vaccine  11/22/2021   Flu Shot  01/03/2022   Hepatitis C Screening: USPSTF Recommendation to screen - Ages 18-79 yo.  Completed   HIV Screening  Completed   HPV Vaccine  Aged Out      Next appointment: Follow up in one year for your annual wellness visit   Preventive Care 40-64 Years, Male Preventive care refers to lifestyle choices and visits with your health care provider that can promote health and wellness. What does preventive care include? A yearly physical exam. This is also called an annual well check. Dental exams once or twice a year. Routine eye exams. Ask your health care provider how often you should have your eyes checked. Personal lifestyle choices, including: Daily care of your teeth and gums. Regular physical activity. Eating a healthy diet. Avoiding tobacco and drug use. Limiting alcohol use. Practicing safe sex. Taking low-dose aspirin every day starting at age 87. What happens during an annual well check? The services and screenings done by your health care provider during your annual well check will depend on your age, overall health, lifestyle risk factors, and family history of disease. Counseling  Your health care provider may ask you questions about your: Alcohol use. Tobacco use. Drug use. Emotional well-being. Home and relationship  well-being. Sexual activity. Eating habits. Work and work Statistician. Screening  You may have the following tests or measurements: Height, weight, and BMI. Blood pressure. Lipid and cholesterol levels. These may be checked every 5 years, or more frequently if you are over 15 years old. Skin check. Lung cancer screening. You may have this screening every year starting at age 82 if you have a 30-pack-year history of smoking and currently smoke or have quit within the past 15 years. Fecal occult blood test (FOBT) of the stool. You may have this test every year starting at age 26. Flexible sigmoidoscopy or colonoscopy. You may have a sigmoidoscopy every 5 years or a colonoscopy every 10 years starting at age 24. Prostate cancer screening. Recommendations will vary depending on your family history and other risks. Hepatitis C blood test. Hepatitis B blood test. Sexually transmitted disease (STD) testing. Diabetes screening. This is done by checking your blood sugar (glucose) after you have not eaten for a while (fasting). You may have this done every 1-3 years. Discuss your test results, treatment options, and if necessary, the need for more tests with your health care provider. Vaccines  Your health care provider may recommend certain vaccines, such as: Influenza vaccine. This is recommended every year. Tetanus, diphtheria, and acellular pertussis (Tdap, Td) vaccine. You may need a Td booster every 10 years. Zoster vaccine. You may need this after age 58. Pneumococcal 13-valent conjugate (PCV13) vaccine. You may need this if you have  certain conditions and have not been vaccinated. Pneumococcal polysaccharide (PPSV23) vaccine. You may need one or two doses if you smoke cigarettes or if you have certain conditions. Talk to your health care provider about which screenings and vaccines you need and how often you need them. This information is not intended to replace advice given to you by your  health care provider. Make sure you discuss any questions you have with your health care provider. Document Released: 06/18/2015 Document Revised: 02/09/2016 Document Reviewed: 03/23/2015 Elsevier Interactive Patient Education  2017 [place] Prevention in the Home Falls can cause injuries. They can happen to people of all ages. There are many things you can do to make your home safe and to help prevent falls. What can I do on the outside of my home? Regularly fix the edges of walkways and driveways and fix any cracks. Remove anything that might make you trip as you walk through a door, such as a raised step or threshold. Trim any bushes or trees on the path to your home. Use bright outdoor lighting. Clear any walking paths of anything that might make someone trip, such as rocks or tools. Regularly check to see if handrails are loose or broken. Make sure that both sides of any steps have handrails. Any raised decks and porches should have guardrails on the edges. Have any leaves, snow, or ice cleared regularly. Use sand or salt on walking paths during winter. Clean up any spills in your garage right away. This includes oil or grease spills. What can I do in the bathroom? Use night lights. Install grab bars by the toilet and in the tub and shower. Do not use towel bars as grab bars. Use non-skid mats or decals in the tub or shower. If you need to sit down in the shower, use a plastic, non-slip stool. Keep the floor dry. Clean up any water that spills on the floor as soon as it happens. Remove soap buildup in the tub or shower regularly. Attach bath mats securely with double-sided non-slip rug tape. Do not have throw rugs and other things on the floor that can make you trip. What can I do in the bedroom? Use night lights. Make sure that you have a light by your bed that is easy to reach. Do not use any sheets or blankets that are too big for your bed. They should not hang down  onto the floor. Have a firm chair that has side arms. You can use this for support while you get dressed. Do not have throw rugs and other things on the floor that can make you trip. What can I do in the kitchen? Clean up any spills right away. Avoid walking on wet floors. Keep items that you use a lot in easy-to-reach places. If you need to reach something above you, use a strong step stool that has a grab bar. Keep electrical cords out of the way. Do not use floor polish or wax that makes floors slippery. If you must use wax, use non-skid floor wax. Do not have throw rugs and other things on the floor that can make you trip. What can I do with my stairs? Do not leave any items on the stairs. Make sure that there are handrails on both sides of the stairs and use them. Fix handrails that are broken or loose. Make sure that handrails are as long as the stairways. Check any carpeting to make sure that it is firmly attached  to the stairs. Fix any carpet that is loose or worn. Avoid having throw rugs at the top or bottom of the stairs. If you do have throw rugs, attach them to the floor with carpet tape. Make sure that you have a light switch at the top of the stairs and the bottom of the stairs. If you do not have them, ask someone to add them for you. What else can I do to help prevent falls? Wear shoes that: Do not have high heels. Have rubber bottoms. Are comfortable and fit you well. Are closed at the toe. Do not wear sandals. If you use a stepladder: Make sure that it is fully opened. Do not climb a closed stepladder. Make sure that both sides of the stepladder are locked into place. Ask someone to hold it for you, if possible. Clearly mark and make sure that you can see: Any grab bars or handrails. First and last steps. Where the edge of each step is. Use tools that help you move around (mobility aids) if they are needed. These include: Canes. Walkers. Scooters. Crutches. Turn  on the lights when you go into a dark area. Replace any light bulbs as soon as they burn out. Set up your furniture so you have a clear path. Avoid moving your furniture around. If any of your floors are uneven, fix them. If there are any pets around you, be aware of where they are. Review your medicines with your doctor. Some medicines can make you feel dizzy. This can increase your chance of falling. Ask your doctor what other things that you can do to help prevent falls. This information is not intended to replace advice given to you by your health care provider. Make sure you discuss any questions you have with your health care provider. Document Released: 03/18/2009 Document Revised: 10/28/2015 Document Reviewed: 06/26/2014 Elsevier Interactive Patient Education  2017 [employer].

## 2022-02-23 NOTE — Progress Notes (Addendum)
Subjective:   David Smith is a 66 y.o. male who presents for Medicare Annual/Subsequent preventive examination.  I connected with  Lynnda Child on 02/23/22 by a audio enabled telemedicine application and verified that I am speaking with the correct person using two identifiers.  Patient Location: Home  Provider Location: Office/Clinic  I discussed the limitations of evaluation and management by telemedicine. The patient expressed understanding and agreed to proceed.   Review of Systems    Defer to PCP Cardiac Risk Factors include: advanced age (>10mn, >>23women);hypertension;dyslipidemia;male gender     Objective:    There were no vitals filed for this visit. There is no height or weight on file to calculate BMI.     02/23/2022    9:43 AM 02/17/2021    9:46 AM 01/05/2021    7:09 AM 12/29/2020    8:45 AM 08/10/2020    9:28 AM 03/02/2020    2:34 PM 08/09/2015   10:50 AM  Advanced Directives  Does Patient Have a Medical Advance Directive? Yes Yes Yes Yes Yes Yes No  Type of AParamedicof [place]ath[place]Living will HCrab OrchardLiving will Healthcare Power of AArgyleof ASewardLiving will Living will   Does patient want to make changes to medical advance directive? No - Patient declined  No - Patient declined No - Patient declined     Copy of HHickmanin Chart? Yes - validated most recent copy scanned in chart (See row information) Yes - validated most recent copy scanned in chart (See row information)  Yes - validated most recent copy scanned in chart (See row information)     Would patient like information on creating a medical advance directive?       No - patient declined information    Current Medications (verified) Outpatient Encounter Medications as of 02/23/2022  Medication Sig   ALPRAZolam (XANAX) 0.25 MG tablet Take 1-2 tablets (0.25-0.5 mg total) by mouth 2 (two)  times daily as needed for anxiety.   Ascorbic Acid (VITAMIN C PO) Take 1 tablet by mouth 3 (three) times a week.   Cyanocobalamin (VITAMIN B 12 PO) Take 1 tablet by mouth 3 (three) times a week.   diltiazem (CARDIZEM CD) 240 MG 24 hr capsule TAKE 1 CAPSULE BY MOUTH ONCE DAILY. SCHEDULE AN APPOINTMENT FOR FURTHER REFILLS; 2ND ATTEMPT   ELIQUIS 5 MG TABS tablet Take 1 tablet by mouth twice daily   leuprolide (LUPRON) 30 MG injection Inject 60 mg into the muscle every 6 (six) months.  (Patient not taking: No sig reported)   meclizine (ANTIVERT) 25 MG tablet Take 1 tablet (25 mg total) by mouth 3 (three) times daily as needed for dizziness. (Patient not taking: No sig reported)   Multiple Vitamin (MULTIVITAMIN WITH MINERALS) TABS tablet Take 1 tablet by mouth daily.   Omega-3 Fatty Acids (FISH OIL PO) Take 1 capsule by mouth once a week.   rosuvastatin (CRESTOR) 20 MG tablet TAKE 1 TABLET BY MOUTH ONCE DAILY . APPOINTMENT REQUIRED FOR FUTURE REFILLS   tiZANidine (ZANAFLEX) 4 MG tablet Take 1 tablet (4 mg total) by mouth every 6 (six) hours as needed for muscle spasms.   No facility-administered encounter medications on file as of 02/23/2022.    Allergies (verified) Patient has no known allergies.   History: Past Medical History:  Diagnosis Date   Chronic lower back pain 06/11/2012   daily a problem Pain level 7,  Tylenol helpful(down to level 2)   Depression    Due to med for his cancer - Lupron   H/O cardiovascular stress test    non ischemic EF 55% 05-15-2007   Heart murmur    dx as a teen, Stress ECHO 2008 (-)    Hypertension    Hypertrophic obstructive cardiomyopathy (HOCM) (HCC)    PAF (paroxysmal atrial fibrillation) ([facility])    Prostate cancer metastatic to intrapelvic lymph node (HCC)    gleason 4+3=7, volume 56.4 cc, PSA 34.25   Past Surgical History:  Procedure Laterality Date   ACHILLES TENDON REPAIR     28 yrs ago, right foot   ANAL FISSURECTOMY  2008   ANTERIOR CERVICAL  DECOMP/DISCECTOMY FUSION N/A 01/05/2021   Procedure: Cervical Three Corpectomy;  Surgeon: Ashok Pall, MD;  Location: [facility];  Service: Neurosurgery;  Laterality: N/A;   dental procedure  06-11-12   minor -office procedure to correct jaw issue-used anesthesia gas to do   LYMPHADENECTOMY  06/17/2012   Procedure: LYMPHADENECTOMY;  Surgeon: [name], MD;  Location: [facility];  Service: Urology;  Laterality: Bilateral;   PROSTATE BIOPSY     ROBOT ASSISTED LAPAROSCOPIC RADICAL PROSTATECTOMY  06/17/2012   Procedure: ROBOTIC ASSISTED LAPAROSCOPIC RADICAL PROSTATECTOMY LEVEL 2;  Surgeon: [name], MD;  Location: [facility];  Service: Urology;  Laterality: N/A;      Family History  Problem Relation Age of Onset   Cancer Mother        breast   Heart disease Mother    Cancer Father 49       prostate, removed   Cancer Brother 46       prostate, tx w/xrt   Colon cancer Neg Hx    Colon polyps Neg Hx    Social History   Socioeconomic History   Marital status: Widowed    Spouse name: Not on file   Number of children: 3   Years of education: Not on file   Highest education level: Not on file  Occupational History   Occupation: disability d/t back pain-not working at present  Tobacco Use   Smoking status: Never   Smokeless tobacco: Never  Vaping Use   Vaping Use: Never used  Substance and Sexual Activity   Alcohol use: Not Currently    Alcohol/week: 0.0 standard drinks of alcohol   Drug use: No   Sexual activity: Yes  Other Topics Concern   Not on file  Social History Narrative   Household: pt, wife 46 y/o child    Right handed   Social Determinants of Health   Financial Resource Strain: Low Risk  (02/17/2021)   Overall Financial Resource Strain (CARDIA)    Difficulty of Paying Living Expenses: Not very hard  Food Insecurity: No Food Insecurity (02/17/2021)   Hunger Vital Sign    Worried About Running Out of Food in the Last Year: Never true    [place] in the Last Year: Never  true  Transportation Needs: No Transportation Needs (02/17/2021)   PRAPARE - Hydrologist (Medical): No    Lack of Transportation (Non-Medical): No  Physical Activity: Sufficiently Active (02/17/2021)   Exercise Vital Sign    Days of Exercise per Week: 7 days    Minutes of Exercise per Session: 30 min  Stress: Stress Concern Present (02/17/2021)   [place]    Feeling of Stress : To some extent  Social Connections:  Moderately Isolated (02/17/2021)   Social Connection and Isolation Panel [NHANES]    Frequency of Communication with Friends and Family: Once a week    Frequency of Social Gatherings with Friends and Family: Once a week    Attends Religious Services: More than 4 times per year    Active Member of [employer] or Organizations: No    Attends Music therapist: Never    Marital Status: Married    Tobacco Counseling Counseling given: Not Answered   Clinical Intake:  Pre-visit preparation completed: Yes  Pain : No/denies pain     Diabetes: No  How often do you need to have someone help you when you read instructions, pamphlets, or other written materials from your doctor or pharmacy?: 1 - Never  Diabetic? No   Activities of Daily Living    02/23/2022    9:51 AM  In your present state of health, do you have any difficulty performing the following activities:  Hearing? 0  Vision? 0  Difficulty concentrating or making decisions? 0  Walking or climbing stairs? 1  Dressing or bathing? 0  Doing errands, shopping? 0  Preparing Food and eating ? N  Using the Toilet? N  In the past six months, have you accidently leaked urine? Y  Comment hx of prostate cancer  Do you have problems with loss of bowel control? N  Managing your Medications? N  Managing your Finances? N  Housekeeping or managing your Housekeeping? N    Patient Care Team: Colon Branch, MD as PCP -  General (Internal Medicine) O'Neal, Cassie Freer, MD as PCP - Cardiology (Internal Medicine) Raynelle Bring, MD as Consulting Physician (Urology) Pieter Partridge, DO as Consulting Physician (Neurology)  Indicate any recent Medical Services you may have received from other than [facility] providers in the past year (date may be approximate).     Assessment:   This is a routine wellness examination for [place].  Hearing/Vision screen No results found.  Dietary issues and exercise activities discussed: Current Exercise Habits: Structured exercise class (silver sneakers), Type of exercise: strength training/weights;Other - see comments (stationary bike), Time (Minutes): 60, Frequency (Times/Week): 2, Weekly Exercise (Minutes/Week): 120, Intensity: Mild, Exercise limited by: orthopedic condition(s)   Goals Addressed   None    Depression Screen    02/23/2022    9:43 AM 02/17/2021    9:50 AM 12/17/2020    9:24 AM 08/17/2020   11:28 AM 05/07/2019    9:59 AM 07/23/2017   10:56 AM 03/27/2016   12:05 PM  PHQ 2/9 Scores  PHQ - 2 Score '6 1 4 '$ 0 0 0 0  PHQ- 9 Score 19  6        Fall Risk    02/23/2022    9:43 AM 02/17/2021    9:49 AM 12/17/2020    8:57 AM 05/07/2019   10:00 AM 07/23/2017   10:56 AM  Fall Risk   Falls in the past year? 0 0 0 0 No  Number falls in past yr: 0 0 0    Injury with Fall? 0 0 0    Risk for fall due to : No Fall Risks      Follow up Falls evaluation completed Falls prevention discussed Falls evaluation completed Falls evaluation completed     [place]:  Any stairs in or around the home? Yes  If so, are there any without handrails? No  Home free of loose throw rugs in walkways,  pet beds, electrical cords, etc? Yes  Adequate lighting in your home to reduce risk of falls? Yes   ASSISTIVE DEVICES UTILIZED TO PREVENT FALLS:  Life alert? No  Use of a cane, walker or w/c? Yes  Grab bars in the bathroom? Yes  Shower chair or bench in  shower? Yes  Elevated toilet seat or a handicapped toilet? No   TIMED UP AND GO:  Was the test performed? No . Audio visit   Cognitive Function:        02/23/2022    9:57 AM  6CIT Screen  What Year? 0 points  What month? 0 points  What time? 0 points  Count back from 20 0 points  Months in reverse 0 points  Repeat phrase 0 points  Total Score 0 points    Immunizations Immunization History  Administered Date(s) Administered   Influenza Split 05/17/2021   Influenza,inj,Quad PF,6+ Mos 06/28/2015   Influenza-Unspecified 03/05/2017, 04/05/2018, 04/06/2019, 03/26/2020   PFIZER(Purple Top)SARS-COV-2 Vaccination 08/21/2019, 09/11/2019, 03/26/2020, 09/29/2020   Pfizer Covid-19 Vaccine Bivalent Booster 50yr & up 05/17/2021   Pneumococcal Polysaccharide-23 05/07/2019   Tdap 11/23/2011   Zoster Recombinat (Shingrix) 06/29/2020    TDAP status: Due, Education has been provided regarding the importance of this vaccine. Advised may receive this vaccine at local pharmacy or Health Dept. Aware to provide a copy of the vaccination record if obtained from local pharmacy or Health Dept. Verbalized acceptance and understanding.  Flu Vaccine status: Due, Education has been provided regarding the importance of this vaccine. Advised may receive this vaccine at local pharmacy or Health Dept. Aware to provide a copy of the vaccination record if obtained from local pharmacy or Health Dept. Verbalized acceptance and understanding.  Pneumococcal vaccine status: Up to date  Covid-19 vaccine status: Information provided on how to obtain vaccines.   Qualifies for Shingles Vaccine? Yes   Zostavax completed No   Shingrix Completed?: No.    Education has been provided regarding the importance of this vaccine. Patient has been advised to call insurance company to determine out of pocket expense if they have not yet received this vaccine. Advised may also receive vaccine at local pharmacy or Health Dept.  Verbalized acceptance and understanding.  Screening Tests Health Maintenance  Topic Date Due   COLONOSCOPY (Pts [id number] coverage will need to be confirmed)  07/26/2020   Zoster Vaccines- Shingrix (2 of 2) 08/24/2020   COVID-19 Vaccine (6 - Pfizer risk series) 07/12/2021   TETANUS/TDAP  11/22/2021   INFLUENZA VACCINE  01/03/2022   Hepatitis C Screening  Completed   HIV Screening  Completed   HPV VACCINES  Aged Out    Health Maintenance  Health Maintenance Due  Topic Date Due   COLONOSCOPY (Pts [id number] coverage will need to be confirmed)  07/26/2020   Zoster Vaccines- Shingrix (2 of 2) 08/24/2020   COVID-19 Vaccine (6 - Pfizer risk series) 07/12/2021   TETANUS/TDAP  11/22/2021   INFLUENZA VACCINE  01/03/2022    Colorectal cancer screening: Type of screening: Colonoscopy. Completed 07/27/15. Repeat every 5 years. Pt is overdue  Lung Cancer Screening: (Low Dose CT Chest recommended if Age 25-60-80ars, 30 pack-year currently smoking OR have quit w/in 15years.) does not qualify.   Lung Cancer Screening Referral: N/a  Additional Screening:  Hepatitis C Screening: does qualify; Completed 09/12/16  Vision Screening: Recommended annual ophthalmology exams for early detection of glaucoma and other disorders of the eye. Is the patient up to date with their annual  eye exam?  No  Who is the provider or what is the name of the office in which the patient attends annual eye exams? [facility] If pt is not established with a provider, would they like to be referred to a provider to establish care? No .   Dental Screening: Recommended annual dental exams for proper oral hygiene  Community Resource Referral / Chronic Care Management: CRR required this visit?  No   CCM required this visit?  No      Plan:     I have personally reviewed and noted the following in the patient's chart:   Medical and social history Use of alcohol, tobacco or illicit drugs  Current  medications and supplements including opioid prescriptions. Patient is not currently taking opioid prescriptions. Functional ability and status Nutritional status Physical activity Advanced directives List of other physicians Hospitalizations, surgeries, and ER visits in previous 12 months Vitals Screenings to include cognitive, depression, and falls Referrals and appointments  In addition, I have reviewed and discussed with patient certain preventive protocols, quality metrics, and best practice recommendations. A written personalized care plan for preventive services as well as general preventive health recommendations were provided to patient.   Due to this being a telephonic visit, the after visit summary with patients personalized plan was offered to patient via mail or my-chart.  Patient was mailed a copy of AVS.  Beatris Ship, [place]   02/23/2022   Nurse Notes: Pt was very tearful on the phone during the [place].  He lost his wife in June.  I gave him information to the behavioral health urgent care and offered him an appointment here.  He declined appointment for now as well as a [facility] referral.  I strongly encouraged him multiple times to visit the urgent care and he stated that he would.  I have reviewed and agree with [facility] documentation.  [name], MD

## 2022-03-01 DIAGNOSIS — M545 Low back pain, unspecified: Secondary | ICD-10-CM | POA: Diagnosis not present

## 2022-03-08 DIAGNOSIS — M545 Low back pain, unspecified: Secondary | ICD-10-CM | POA: Diagnosis not present

## 2022-03-09 ENCOUNTER — Other Ambulatory Visit: Payer: Self-pay | Admitting: Cardiovascular Disease

## 2022-04-02 ENCOUNTER — Other Ambulatory Visit: Payer: Self-pay | Admitting: Cardiovascular Disease

## 2022-04-09 ENCOUNTER — Other Ambulatory Visit: Payer: Self-pay | Admitting: Cardiovascular Disease

## 2022-04-17 ENCOUNTER — Other Ambulatory Visit: Payer: Self-pay | Admitting: Cardiovascular Disease

## 2022-04-17 NOTE — Telephone Encounter (Signed)
Prescription refill request for Eliquis received. Indication: PAF Last office visit: 07/02/20  Dorann Lodge MD (Has appt 06/12/22/Requested BC/BMP at that time) Scr: 1.34 on 12/17/20 Age: 66 Weight: 119.8kg  Based on above findings Eliquis '5mg'$  twice daily is the appropriate dose.  Refill approved.

## 2022-05-01 ENCOUNTER — Other Ambulatory Visit: Payer: Self-pay | Admitting: Cardiovascular Disease

## 2022-05-01 DIAGNOSIS — C61 Malignant neoplasm of prostate: Secondary | ICD-10-CM | POA: Diagnosis not present

## 2022-05-08 DIAGNOSIS — M4802 Spinal stenosis, cervical region: Secondary | ICD-10-CM | POA: Diagnosis not present

## 2022-05-08 DIAGNOSIS — Z6834 Body mass index (BMI) 34.0-34.9, adult: Secondary | ICD-10-CM | POA: Diagnosis not present

## 2022-05-17 ENCOUNTER — Other Ambulatory Visit: Payer: Self-pay | Admitting: Neurosurgery

## 2022-05-17 DIAGNOSIS — M4802 Spinal stenosis, cervical region: Secondary | ICD-10-CM

## 2022-05-29 ENCOUNTER — Other Ambulatory Visit: Payer: Self-pay | Admitting: Cardiovascular Disease

## 2022-06-11 NOTE — Progress Notes (Unsigned)
Cardiology Office Note:   Date:  06/12/2022  NAME:  David Smith    MRN: [id number] DOB:  24-Sep-1955   PCP:  Colon Branch, MD  Cardiologist:  Evalina Field, MD  Electrophysiologist:  None   Referring MD: Colon Branch, MD   Chief Complaint  Patient presents with   Follow-up    History of Present Illness:   David Smith is a 67 y.o. male with a hx of HOCM, pAF, HTN, dilated ascending aorta who presents for follow-up.  He reports he is doing well.  Denies any chest pain or trouble breathing.  He reports that he lost his wife earlier in 2021-08-17.  He reports he is having difficulty with bereavement.  I do understand.  They were married for 44 years.  He is now raising a 60 year old daughter.  He reports there are some issues but they are working through this.  He reports 1 episode of heart racing roughly 2 weeks ago.  He had no A-fib episodes.  He reports he did undergo cervical spine surgery in 08-17-20.  Still recovering from that.  Denies any chest pain or trouble breathing.  Faint systolic ejection murmur on examination today.  We discussed needing repeat lab work as he has not had any in over a year.  He also needs a repeat echo and monitor and EKG today in office.  No syncope reported.  He screened negative for sudden cardiac death in 08-18-2019.  Problem List 1. HOCM -23 mm septum (sigmoid septum variant) -LGE 2% -negative monitor for NSVT -no syncope -no family history of SCD -LVOTO 57 mmHG -> 10 mmHG with treatment -ETT 06/08/2020 -> Normal BP response to exercise  2. Atrial fibrillation, paroxysmal 3. HTN 4. Dilated ascending aorta -44 mm on CMR 02/2020 5. Prostate CA -s/p prostatectomy/radiation -on anti-androgen therapy  -c/b radiation colitis/bladder damage  Past Medical History: Past Medical History:  Diagnosis Date   Chronic lower back pain 06/11/2012   daily a problem Pain level 7, Tylenol helpful(down to level 2)   Depression    Due to med for his cancer - Lupron   H/O  cardiovascular stress test    non ischemic EF 55% 05-15-2007   Heart murmur    dx as a teen, Stress ECHO 08/18/06 (-)    Hypertension    Hypertrophic obstructive cardiomyopathy (HOCM) (HCC)    PAF (paroxysmal atrial fibrillation) ([place])    Prostate cancer metastatic to intrapelvic lymph node (HCC)    gleason 4+3=7, volume 56.4 cc, PSA 34.25    Past Surgical History: Past Surgical History:  Procedure Laterality Date   ACHILLES TENDON REPAIR     26 yrs ago, right foot   ANAL FISSURECTOMY  August 18, 2006   ANTERIOR CERVICAL DECOMP/DISCECTOMY FUSION N/A 01/05/2021   Procedure: Cervical Three Corpectomy;  Surgeon: Ashok Pall, MD;  Location: [facility];  Service: Neurosurgery;  Laterality: N/A;   dental procedure  06-11-12   minor -office procedure to correct jaw issue-used anesthesia gas to do   LYMPHADENECTOMY  06/17/2012   Procedure: LYMPHADENECTOMY;  Surgeon: [name], MD;  Location: [facility];  Service: Urology;  Laterality: Bilateral;   PROSTATE BIOPSY     ROBOT ASSISTED LAPAROSCOPIC RADICAL PROSTATECTOMY  06/17/2012   Procedure: ROBOTIC ASSISTED LAPAROSCOPIC RADICAL PROSTATECTOMY LEVEL 2;  Surgeon: [name], MD;  Location: [facility];  Service: Urology;  Laterality: N/A;       Current Medications: Current Meds  Medication Sig   ALPRAZolam (XANAX) 0.25  MG tablet Take 1-2 tablets (0.25-0.5 mg total) by mouth 2 (two) times daily as needed for anxiety.   Ascorbic Acid (VITAMIN C PO) Take 1 tablet by mouth 3 (three) times a week.   Cyanocobalamin (VITAMIN B 12 PO) Take 1 tablet by mouth 3 (three) times a week.   diltiazem (CARDIZEM CD) 240 MG 24 hr capsule Take 1 capsule (240 mg total) by mouth daily.   ELIQUIS 5 MG TABS tablet TAKE 1 TABLET BY MOUTH TWICE DAILY . APPOINTMENT REQUIRED FOR FUTURE REFILLS   leuprolide (LUPRON) 30 MG injection Inject 60 mg into the muscle every 6 (six) months.   meclizine (ANTIVERT) 25 MG tablet Take 1 tablet (25 mg total) by mouth 3 (three) times daily as needed for dizziness.    Multiple Vitamin (MULTIVITAMIN WITH MINERALS) TABS tablet Take 1 tablet by mouth daily.   Omega-3 Fatty Acids (FISH OIL PO) Take 1 capsule by mouth once a week.   rosuvastatin (CRESTOR) 20 MG tablet Take 1 tablet (20 mg total) by mouth daily. PLEASE KEEP UPCOMING APPOINTMENT FOR FUTURE REFILLS   tiZANidine (ZANAFLEX) 4 MG tablet Take 1 tablet (4 mg total) by mouth every 6 (six) hours as needed for muscle spasms.     Allergies:    Patient has no known allergies.   Social History: Social History   Socioeconomic History   Marital status: Widowed    Spouse name: Not on file   Number of children: 3   Years of education: Not on file   Highest education level: Not on file  Occupational History   Occupation: disability d/t back pain-not working at present  Tobacco Use   Smoking status: Never   Smokeless tobacco: Never  Vaping Use   Vaping Use: Never used  Substance and Sexual Activity   Alcohol use: Not Currently    Alcohol/week: 0.0 standard drinks of alcohol   Drug use: No   Sexual activity: Yes  Other Topics Concern   Not on file  Social History Narrative   Household: pt, wife 35 y/o child    Right handed   Social Determinants of Health   Financial Resource Strain: Low Risk  (02/17/2021)   Overall Financial Resource Strain (CARDIA)    Difficulty of Paying Living Expenses: Not very hard  Food Insecurity: No Food Insecurity (02/17/2021)   Hunger Vital Sign    Worried About Running Out of Food in the Last Year: Never true    [place] in the Last Year: Never true  Transportation Needs: No Transportation Needs (02/17/2021)   PRAPARE - Hydrologist (Medical): No    Lack of Transportation (Non-Medical): No  Physical Activity: Sufficiently Active (02/17/2021)   Exercise Vital Sign    Days of Exercise per Week: 7 days    Minutes of Exercise per Session: 30 min  Stress: Stress Concern Present (02/17/2021)   [place]    Feeling of Stress : To some extent  Social Connections: Moderately Isolated (02/17/2021)   Social Connection and Isolation Panel [NHANES]    Frequency of Communication with Friends and Family: Once a week    Frequency of Social Gatherings with Friends and Family: Once a week    Attends Religious Services: More than 4 times per year    Active Member of [employer] or Organizations: No    Attends Archivist Meetings: Never    Marital Status: Married  Family History: The patient's family history includes Cancer in his mother; Cancer (age of onset: 10) in his brother; Cancer (age of onset: 73) in his father; Heart disease in his mother. There is no history of Colon cancer or Colon polyps.  ROS:   All other ROS reviewed and negative. Pertinent positives noted in the HPI.     EKGs/Labs/Other Studies Reviewed:   The following studies were personally reviewed by me today:  EKG:  EKG is ordered today.  The ekg ordered today demonstrates sinus rhythm heart rate 64, PVCs noted, left anterior fascicular block, and was personally reviewed by me.   TTE 03/02/2020  1. Left ventricular ejection fraction, by estimation, is 70 to 75%. The  left ventricle has hyperdynamic function. The left ventricle has no  regional wall motion abnormalities. There is severe left ventricular  hypertrophy of the basal-septal segment.  This measures at least 2.5 cm. There is LVOT obstruction at rest  consistent with HOCM. Peak gradient across the LVOT is 57 mmHg. Valsalva  was not performed. Left ventricular diastolic parameters are consistent  with Grade I diastolic dysfunction (impaired   relaxation). Elevated left ventricular end-diastolic pressure.   2. Right ventricular systolic function is normal. The right ventricular  size is normal.   3. Systolic anterior motion (SAM) of the anterior leaflet is noted. The  mitral valve is abnormal. Trivial mitral valve  regurgitation.   4. The aortic valve is tricuspid. Aortic valve regurgitation is trivial.  Mild to moderate aortic valve sclerosis/calcification is present, without  any evidence of aortic stenosis.   5. Aortic dilatation noted. There is mild dilatation of the ascending  aorta, measuring 41 mm.   Recent Labs: No results found for requested labs within last 365 days.   Recent Lipid Panel    Component Value Date/Time   CHOL 124 07/02/2020 1048   TRIG 56 07/02/2020 1048   HDL 42 07/02/2020 1048   CHOLHDL 3.0 07/02/2020 1048   CHOLHDL 4.4 03/03/2020 0539   VLDL 12 03/03/2020 0539   LDLCALC 70 07/02/2020 1048   LDLDIRECT 162.4 04/12/2007 0000    Physical Exam:   VS:  BP (!) 148/70   Pulse 77   Ht 6' (1.829 m)   Wt 252 lb (114.3 kg)   SpO2 98%   BMI 34.18 kg/m    Wt Readings from Last 3 Encounters:  06/12/22 252 lb (114.3 kg)  02/17/21 252 lb (114.3 kg)  02/08/21 252 lb 2 oz (114.4 kg)    General: Well nourished, well developed, in no acute distress Head: Atraumatic, normal size  Eyes: PEERLA, EOMI  Neck: Supple, no JVD Endocrine: No thryomegaly Cardiac: Normal S1, S2; RRR; 2 out of 6 systolic ejection murmur Lungs: Clear to auscultation bilaterally, no wheezing, rhonchi or rales  Abd: Soft, nontender, no hepatomegaly  Ext: No edema, pulses 2+ Musculoskeletal: No deformities, BUE and BLE strength normal and equal Skin: Warm and dry, no rashes   Neuro: Alert and oriented to person, place, time, and situation, CNII-XII grossly intact, no focal deficits  Psych: Normal mood and affect   ASSESSMENT:   David Smith is a 67 y.o. male who presents for the following: 1. HOCM (hypertrophic obstructive cardiomyopathy) (HCC)   2. Paroxysmal atrial fibrillation ([place])   3. Primary hypertension   4. Ascending aorta dilatation (HCC)     PLAN:   1. HOCM (hypertrophic obstructive cardiomyopathy) (HCC) -History of hypertrophic obstructive cardiomyopathy.  His gradient was  improved 2 years ago  with treatment on diltiazem.  He reports no exertional chest pain or chest pressure.  We will continue diltiazem 240 mg daily.  Blood pressure slightly elevated.  He will keep an eye on this. -Cardiac MRI with 2% late gadolinium enhancement. -Stress test unremarkable 2 years ago.  No symptoms. -He screened negative for nonsustained ventricular tachycardia 2 years ago. -I would like to repeat his echocardiogram as well as a 3-day Zio patch to exclude any arrhythmias.  He does describe palpitations 2 weeks ago.  None today.  EKG does demonstrate PVCs but no symptoms of this.  Will continue Cardizem for now.  No signs of heart failure.  Overall doing well.  He understands he is feeling there is need screening.  2. Paroxysmal atrial fibrillation (HCC) -No recurrence.  Continue anticoagulation in setting of hypertrophic cardiomyopathy.  3. Primary hypertension -Continue current medications.  Close monitoring. -He does need full panel of labs today.  Has not had any in 2 years.  4. Ascending aorta dilatation (HCC) -Echo with 41 mm 2 years ago.  44 mm by MRI.  We will repeat an echo and determine if he needs CT scan.  It may match up closely with MRI.  For now repeat echo as above.      Disposition: Return in about 1 year (around 06/13/2023).  Medication Adjustments/Labs and Tests Ordered: Current medicines are reviewed at length with the patient today.  Concerns regarding medicines are outlined above.  Orders Placed This Encounter  Procedures   Hemoglobin A1c   CBC   Comprehensive metabolic panel   Lipid panel   LONG TERM MONITOR (3-14 DAYS)   EKG 12-Lead   ECHOCARDIOGRAM COMPLETE   No orders of the defined types were placed in this encounter.   Patient Instructions  Medication Instructions:  The current medical regimen is effective;  continue present plan and medications.  *If you need a refill on your cardiac medications before your next appointment, please call  your pharmacy*   Lab Work: A1C, CMET, CBC, LIPID today   If you have labs (blood work) drawn today and your tests are completely normal, you will receive your results only by: [place] (if you have MyChart) OR A paper copy in the mail If you have any lab test that is abnormal or we need to change your treatment, we will call you to review the results.   Testing/Procedures:  Echocardiogram - Your physician has requested that you have an echocardiogram. Echocardiography is a painless test that uses sound waves to create images of your heart. It provides your doctor with information about the size and shape of your heart and how well your heart's chambers and valves are working. This procedure takes approximately one hour. There are no restrictions for this procedure.   ZIO XT- Long Term Monitor Instructions  Your physician has requested you wear a ZIO patch monitor for 7 days.  This is a single patch monitor. [employer] supplies one patch monitor per enrollment. Additional stickers are not available. Please do not apply patch if you will be having a Nuclear Stress Test,  Echocardiogram, Cardiac CT, MRI, or Chest Xray during the period you would be wearing the  monitor. The patch cannot be worn during these tests. You cannot remove and re-apply the  ZIO XT patch monitor.  Your ZIO patch monitor will be mailed 3 day USPS to your address on file. It may take 3-5 days  to receive your monitor after you have been enrolled.  Once you have received your monitor, please review the enclosed instructions. Your monitor  has already been registered assigning a specific monitor serial # to you.  Billing and Patient Assistance Program Information  We have supplied [employer] with any of your insurance information on file for billing purposes. [employer] offers a sliding scale Patient Assistance Program for patients that do not have  insurance, or whose insurance does not completely cover the cost of  the ZIO monitor.  You must apply for the Patient Assistance Program to qualify for this discounted rate.  To apply, please call [employer] at [phone], select option 4, select option 2, ask to apply for  Patient Assistance Program. Theodore Demark will ask your household income, and how many people  are in your household. They will quote your out-of-pocket cost based on that information.  [employer] will also be able to set up a 71-month interest-free payment plan if needed.  Applying the monitor   Shave hair from upper left chest.  Hold abrader disc by orange tab. Rub abrader in 40 strokes over the upper left chest as  indicated in your monitor instructions.  Clean area with 4 enclosed alcohol pads. Let dry.  Apply patch as indicated in monitor instructions. Patch will be placed under collarbone on left  side of chest with arrow pointing upward.  Rub patch adhesive wings for 2 minutes. Remove white label marked "1". Remove the white  label marked "2". Rub patch adhesive wings for 2 additional minutes.  While looking in a mirror, press and release button in center of patch. A small green light will  flash 3-4 times. This will be your only indicator that the monitor has been turned on.  Do not shower for the first 24 hours. You may shower after the first 24 hours.  Press the button if you feel a symptom. You will hear a small click. Record Date, Time and  Symptom in the Patient Logbook.  When you are ready to remove the patch, follow instructions on the last 2 pages of Patient  Logbook. Stick patch monitor onto the last page of Patient Logbook.  Place Patient Logbook in the blue and white box. Use locking tab on box and tape box closed  securely. The blue and white box has prepaid postage on it. Please place it in the mailbox as  soon as possible. Your physician should have your test results approximately 7 days after the  monitor has [facility]  Call [employer] [phone] you have questions regarding  your ZIO XT patch monitor. Call them immediately if you see an orange light blinking on your  monitor.  If your monitor falls off in less than 4 days, contact our Monitor department at [phone]  If your monitor becomes loose or falls off after 4 days call [employer] at [phone]  suggestions on securing your monitor    Follow-Up: [facility] and your [facility] needs are our priority.  As part of our continuing mission to provide you with exceptional heart care, we have created designated Provider Care Teams.  These Care Teams include your primary Cardiologist (physician) and Advanced Practice Providers (APPs -  Physician Assistants and Nurse Practitioners) who all work together to provide you with the care you need, when you need it.  We recommend signing up for the patient portal called "MyChart".  Sign up information is provided on this After Visit Summary.  MyChart is used to  connect with patients for Virtual Visits (Telemedicine).  Patients are able to view lab/test results, encounter notes, upcoming appointments, etc.  Non-urgent messages can be sent to your provider as well.   To learn more about what you can do with MyChart, go to [email or web address].    Your next appointment:   12 month(s)  The format for your next appointment:   In Person  Provider:   Evalina Field, MD            Time Spent with Patient: I have spent a total of 35 minutes with patient reviewing hospital notes, telemetry, EKGs, labs and examining the patient as well as establishing an assessment and plan that was discussed with the patient.  > 50% of time was spent in direct patient care.  Signed, Addison Naegeli. Audie Box, MD, [facility]  [address] [place], [place] [address] [phone]  06/12/2022 3:04 PM

## 2022-06-12 ENCOUNTER — Ambulatory Visit (INDEPENDENT_AMBULATORY_CARE_PROVIDER_SITE_OTHER): Payer: Medicare HMO

## 2022-06-12 ENCOUNTER — Ambulatory Visit: Payer: Medicare HMO | Attending: Cardiovascular Disease | Admitting: Cardiovascular Disease

## 2022-06-12 ENCOUNTER — Encounter: Payer: Self-pay | Admitting: Cardiovascular Disease

## 2022-06-12 VITALS — BP 148/70 | HR 77 | Ht 72.0 in | Wt 252.0 lb

## 2022-06-12 DIAGNOSIS — I10 Essential (primary) hypertension: Secondary | ICD-10-CM | POA: Diagnosis not present

## 2022-06-12 DIAGNOSIS — I480 Paroxysmal atrial fibrillation: Secondary | ICD-10-CM

## 2022-06-12 DIAGNOSIS — I421 Obstructive hypertrophic cardiomyopathy: Secondary | ICD-10-CM | POA: Diagnosis not present

## 2022-06-12 DIAGNOSIS — I77810 Thoracic aortic ectasia: Secondary | ICD-10-CM | POA: Diagnosis not present

## 2022-06-12 DIAGNOSIS — E785 Hyperlipidemia, unspecified: Secondary | ICD-10-CM | POA: Diagnosis not present

## 2022-06-12 LAB — LIPID PANEL

## 2022-06-12 NOTE — Patient Instructions (Signed)
Medication Instructions:  The current medical regimen is effective;  continue present plan and medications.  *If you need a refill on your cardiac medications before your next appointment, please call your pharmacy*   Lab Work: A1C, CMET, CBC, LIPID today   If you have labs (blood work) drawn today and your tests are completely normal, you will receive your results only by: [place] (if you have MyChart) OR A paper copy in the mail If you have any lab test that is abnormal or we need to change your treatment, we will call you to review the results.   Testing/Procedures:  Echocardiogram - Your physician has requested that you have an echocardiogram. Echocardiography is a painless test that uses sound waves to create images of your heart. It provides your doctor with information about the size and shape of your heart and how well your heart's chambers and valves are working. This procedure takes approximately one hour. There are no restrictions for this procedure.   ZIO XT- Long Term Monitor Instructions  Your physician has requested you wear a ZIO patch monitor for 7 days.  This is a single patch monitor. [employer] supplies one patch monitor per enrollment. Additional stickers are not available. Please do not apply patch if you will be having a Nuclear Stress Test,  Echocardiogram, Cardiac CT, MRI, or Chest Xray during the period you would be wearing the  monitor. The patch cannot be worn during these tests. You cannot remove and re-apply the  ZIO XT patch monitor.  Your ZIO patch monitor will be mailed 3 day USPS to your address on file. It may take 3-5 days  to receive your monitor after you have been enrolled.  Once you have received your monitor, please review the enclosed instructions. Your monitor  has already been registered assigning a specific monitor serial # to you.  Billing and Patient Assistance Program Information  We have supplied [employer] with any of your  insurance information on file for billing purposes. [employer] offers a sliding scale Patient Assistance Program for patients that do not have  insurance, or whose insurance does not completely cover the cost of the ZIO monitor.  You must apply for the Patient Assistance Program to qualify for this discounted rate.  To apply, please call [employer] at [phone], select option 4, select option 2, ask to apply for  Patient Assistance Program. Theodore Demark will ask your household income, and how many people  are in your household. They will quote your out-of-pocket cost based on that information.  [employer] will also be able to set up a 32-month interest-free payment plan if needed.  Applying the monitor   Shave hair from upper left chest.  Hold abrader disc by orange tab. Rub abrader in 40 strokes over the upper left chest as  indicated in your monitor instructions.  Clean area with 4 enclosed alcohol pads. Let dry.  Apply patch as indicated in monitor instructions. Patch will be placed under collarbone on left  side of chest with arrow pointing upward.  Rub patch adhesive wings for 2 minutes. Remove white label marked "1". Remove the white  label marked "2". Rub patch adhesive wings for 2 additional minutes.  While looking in a mirror, press and release button in center of patch. A small green light will  flash 3-4 times. This will be your only indicator that the monitor has been turned on.  Do not shower for the first 24 hours. You may shower after the first 24  hours.  Press the button if you feel a symptom. You will hear a small click. Record Date, Time and  Symptom in the Patient Logbook.  When you are ready to remove the patch, follow instructions on the last 2 pages of Patient  Logbook. Stick patch monitor onto the last page of Patient Logbook.  Place Patient Logbook in the blue and white box. Use locking tab on box and tape box closed  securely. The blue and white box has prepaid postage on  it. Please place it in the mailbox as  soon as possible. Your physician should have your test results approximately 7 days after the  monitor has been mailed back to [facility].  Call [place] at [phone] if you have questions regarding  your ZIO XT patch monitor. Call them immediately if you see an orange light blinking on your  monitor.  If your monitor falls off in less than 4 days, contact our Monitor department at [phone].  If your monitor becomes loose or falls off after 4 days call [employer] at [phone] for  suggestions on securing your monitor    Follow-Up: At [facility], you and your health needs are our priority.  As part of our continuing mission to provide you with exceptional heart care, we have created designated Provider Care Teams.  These Care Teams include your primary Cardiologist (physician) and Advanced Practice Providers (APPs -  Physician Assistants and Nurse Practitioners) who all work together to provide you with the care you need, when you need it.  We recommend signing up for the patient portal called "MyChart".  Sign up information is provided on this After Visit Summary.  MyChart is used to connect with patients for Virtual Visits (Telemedicine).  Patients are able to view lab/test results, encounter notes, upcoming appointments, etc.  Non-urgent messages can be sent to your provider as well.   To learn more about what you can do with MyChart, go to [email or web address].    Your next appointment:   12 month(s)  The format for your next appointment:   In Person  Provider:   Evalina Field, MD

## 2022-06-12 NOTE — Progress Notes (Unsigned)
Enrolled for [employer] to mail a ZIO XT long term holter monitor to the patients address on file.  

## 2022-06-13 LAB — CBC
Hematocrit: 45.2 % (ref 37.5–51.0)
Hemoglobin: 14.2 g/dL (ref 13.0–17.7)
MCH: 26.2 pg — ABNORMAL LOW (ref 26.6–33.0)
MCHC: 31.4 g/dL — ABNORMAL LOW (ref 31.5–35.7)
MCV: 83 fL (ref 79–97)
Platelets: 164 10*3/uL (ref 150–450)
RBC: 5.42 x10E6/uL (ref 4.14–5.80)
RDW: 13.3 % (ref 11.6–15.4)
WBC: 6.5 10*3/uL (ref 3.4–10.8)

## 2022-06-13 LAB — LIPID PANEL
Chol/HDL Ratio: 2.9 ratio (ref 0.0–5.0)
Cholesterol, Total: 125 mg/dL (ref 100–199)
HDL: 43 mg/dL
LDL Chol Calc (NIH): 66 mg/dL (ref 0–99)
Triglycerides: 78 mg/dL (ref 0–149)
VLDL Cholesterol Cal: 16 mg/dL (ref 5–40)

## 2022-06-13 LAB — COMPREHENSIVE METABOLIC PANEL WITH GFR
ALT: 24 [IU]/L (ref 0–44)
AST: 20 [IU]/L (ref 0–40)
Albumin/Globulin Ratio: 2 (ref 1.2–2.2)
Albumin: 4.6 g/dL (ref 3.9–4.9)
Alkaline Phosphatase: 115 [IU]/L (ref 44–121)
BUN/Creatinine Ratio: 11 (ref 10–24)
BUN: 13 mg/dL (ref 8–27)
Bilirubin Total: 0.3 mg/dL (ref 0.0–1.2)
CO2: 24 mmol/L (ref 20–29)
Calcium: 9.6 mg/dL (ref 8.6–10.2)
Chloride: 106 mmol/L (ref 96–106)
Creatinine, Ser: 1.23 mg/dL (ref 0.76–1.27)
Globulin, Total: 2.3 g/dL (ref 1.5–4.5)
Glucose: 96 mg/dL (ref 70–99)
Potassium: 4.7 mmol/L (ref 3.5–5.2)
Sodium: 145 mmol/L — ABNORMAL HIGH (ref 134–144)
Total Protein: 6.9 g/dL (ref 6.0–8.5)
eGFR: 66 mL/min/{1.73_m2}

## 2022-06-13 LAB — HEMOGLOBIN A1C
Est. average glucose Bld gHb Est-mCnc: 126 mg/dL
Hgb A1c MFr Bld: 6 % — ABNORMAL HIGH (ref 4.8–5.6)

## 2022-06-16 DIAGNOSIS — C61 Malignant neoplasm of prostate: Secondary | ICD-10-CM | POA: Diagnosis not present

## 2022-06-16 LAB — PSA: PSA: 1.56

## 2022-06-17 ENCOUNTER — Ambulatory Visit
Admission: RE | Admit: 2022-06-17 | Discharge: 2022-06-17 | Disposition: A | Discharge status: Home / Self Care | Payer: Medicare HMO | Source: Ambulatory Visit | Attending: Neurosurgery | Admitting: Neurosurgery

## 2022-06-17 DIAGNOSIS — M4802 Spinal stenosis, cervical region: Secondary | ICD-10-CM | POA: Diagnosis not present

## 2022-06-17 DIAGNOSIS — R200 Anesthesia of skin: Secondary | ICD-10-CM | POA: Diagnosis not present

## 2022-06-23 DIAGNOSIS — C61 Malignant neoplasm of prostate: Secondary | ICD-10-CM | POA: Diagnosis not present

## 2022-06-23 DIAGNOSIS — C775 Secondary and unspecified malignant neoplasm of intrapelvic lymph nodes: Secondary | ICD-10-CM | POA: Diagnosis not present

## 2022-06-27 ENCOUNTER — Encounter: Payer: Self-pay | Admitting: Internal Medicine

## 2022-06-29 DIAGNOSIS — Z6833 Body mass index (BMI) 33.0-33.9, adult: Secondary | ICD-10-CM | POA: Diagnosis not present

## 2022-06-29 DIAGNOSIS — M4802 Spinal stenosis, cervical region: Secondary | ICD-10-CM | POA: Diagnosis not present

## 2022-06-30 ENCOUNTER — Other Ambulatory Visit: Payer: Self-pay | Admitting: Cardiovascular Disease

## 2022-07-04 DIAGNOSIS — I480 Paroxysmal atrial fibrillation: Secondary | ICD-10-CM | POA: Diagnosis not present

## 2022-07-07 ENCOUNTER — Ambulatory Visit (HOSPITAL_COMMUNITY): Payer: Medicare HMO | Attending: Cardiology

## 2022-07-07 DIAGNOSIS — I421 Obstructive hypertrophic cardiomyopathy: Secondary | ICD-10-CM | POA: Insufficient documentation

## 2022-07-07 LAB — ECHOCARDIOGRAM COMPLETE
Area-P 1/2: 3.03 cm2
P 1/2 time: 432 msec
S' Lateral: 3.4 cm

## 2022-07-10 ENCOUNTER — Other Ambulatory Visit (HOSPITAL_COMMUNITY): Payer: Self-pay | Admitting: Urology

## 2022-07-10 DIAGNOSIS — C775 Secondary and unspecified malignant neoplasm of intrapelvic lymph nodes: Secondary | ICD-10-CM

## 2022-07-13 ENCOUNTER — Encounter (HOSPITAL_COMMUNITY): Payer: Self-pay | Admitting: *Deleted

## 2022-07-19 DIAGNOSIS — I480 Paroxysmal atrial fibrillation: Secondary | ICD-10-CM | POA: Diagnosis not present

## 2022-07-24 ENCOUNTER — Encounter (HOSPITAL_COMMUNITY)
Admission: RE | Admit: 2022-07-24 | Discharge: 2022-07-24 | Disposition: A | Discharge status: Home / Self Care | Payer: Medicare HMO | Source: Ambulatory Visit | Attending: Urology | Admitting: Urology

## 2022-07-24 DIAGNOSIS — C779 Secondary and unspecified malignant neoplasm of lymph node, unspecified: Secondary | ICD-10-CM | POA: Diagnosis not present

## 2022-07-24 DIAGNOSIS — C775 Secondary and unspecified malignant neoplasm of intrapelvic lymph nodes: Secondary | ICD-10-CM | POA: Diagnosis not present

## 2022-07-24 DIAGNOSIS — C61 Malignant neoplasm of prostate: Secondary | ICD-10-CM | POA: Diagnosis not present

## 2022-07-24 MED ORDER — PIFLIFOLASTAT F 18 (PYLARIFY) INJECTION
9.0000 | Freq: Once | INTRAVENOUS | Status: AC
  Administered 2022-07-24: 9.77 via INTRAVENOUS

## 2022-08-02 ENCOUNTER — Ambulatory Visit
Admission: RE | Admit: 2022-08-02 | Discharge: 2022-08-02 | Disposition: A | Discharge status: Home / Self Care | Payer: Self-pay | Source: Ambulatory Visit | Attending: Radiation Oncology | Admitting: Radiation Oncology

## 2022-08-02 ENCOUNTER — Other Ambulatory Visit: Payer: Self-pay | Admitting: Radiation Oncology

## 2022-08-02 DIAGNOSIS — C775 Secondary and unspecified malignant neoplasm of intrapelvic lymph nodes: Secondary | ICD-10-CM

## 2022-08-02 DIAGNOSIS — C61 Malignant neoplasm of prostate: Secondary | ICD-10-CM

## 2022-08-03 ENCOUNTER — Telehealth: Payer: Self-pay | Admitting: *Deleted

## 2022-08-03 NOTE — Telephone Encounter (Signed)
   Pre-operative Risk Assessment    Patient Name: David Smith  DOB: [birth year] MRN: [id number]      Request for Surgical Clearance    Procedure:   C3-4 Posterior cervical Laminectomy  Date of Surgery:  Clearance TBD                                 Surgeon:  Dr. Ashok Pall Surgeon's Group or Practice Name:  [facility] Phone number:  [phone] Fax number:  [phone]   Type of Clearance Requested:   - Medical  - Pharmacy:  Hold Apixaban (Eliquis) Not indicated.   Type of Anesthesia:  General    Additional requests/questions:    Signed, Greer Ee   08/03/2022, 9:58 AM

## 2022-08-04 ENCOUNTER — Telehealth: Payer: Self-pay | Admitting: *Deleted

## 2022-08-04 NOTE — Telephone Encounter (Signed)
Pt has been scheduled for a tele visit, 08/10/22.  Consent on file / medications reconciled.    Patient Consent for Virtual Visit   3      David Smith has provided verbal consent on 08/04/2022 for a virtual visit (video or telephone).   CONSENT FOR VIRTUAL VISIT FOR:  David Smith  By participating in this virtual visit I agree to the following:  I hereby voluntarily request, consent and authorize [facility] and its employed or contracted physicians, physician assistants, nurse practitioners or other licensed health care professionals (the Practitioner), to provide me with telemedicine health care services (the "Services") as deemed necessary by the treating Practitioner. I acknowledge and consent to receive the Services by the Practitioner via telemedicine. I understand that the telemedicine visit will involve communicating with the Practitioner through live audiovisual communication technology and the disclosure of certain medical information by electronic transmission. I acknowledge that I have been given the opportunity to request an in-person assessment or other available alternative prior to the telemedicine visit and am voluntarily participating in the telemedicine visit.  I understand that I have the right to withhold or withdraw my consent to the use of telemedicine in the course of my care at any time, without affecting my right to future care or treatment, and that the Practitioner or I may terminate the telemedicine visit at any time. I understand that I have the right to inspect all information obtained and/or recorded in the course of the telemedicine visit and may receive copies of available information for a reasonable fee.  I understand that some of the potential risks of receiving the Services via telemedicine include:  Delay or interruption in medical evaluation due to technological equipment failure or disruption; Information transmitted may not be sufficient (e.g.  poor resolution of images) to allow for appropriate medical decision making by the Practitioner; and/or  In rare instances, security protocols could fail, causing a breach of personal health information.  Furthermore, I acknowledge that it is my responsibility to provide information about my medical history, conditions and care that is complete and accurate to the best of my ability. I acknowledge that Practitioner's advice, recommendations, and/or decision may be based on factors not within their control, such as incomplete or inaccurate data provided by me or distortions of diagnostic images or specimens that may result from electronic transmissions. I understand that the practice of medicine is not an exact science and that Practitioner makes no warranties or guarantees regarding treatment outcomes. I acknowledge that a copy of this consent can be made available to me via my patient portal ([facility]), or I can request a printed copy by calling the office of [facility].    I understand that my insurance will be billed for this visit.   I have read or had this consent read to me. I understand the contents of this consent, which adequately explains the benefits and risks of the Services being provided via telemedicine.  I have been provided ample opportunity to ask questions regarding this consent and the Services and have had my questions answered to my satisfaction. I give my informed consent for the services to be provided through the use of telemedicine in my medical care

## 2022-08-04 NOTE — Telephone Encounter (Signed)
   Name: David Smith  DOB: 04/01/1956  MRN: [id number]  Primary Cardiologist: Evalina Field, MD  Chart reviewed as part of pre-operative protocol coverage. Because of [name] past medical history and time since last visit, he will require a follow-up telephone visit in order to better assess preoperative cardiovascular risk.  Pre-op covering staff: - Please schedule appointment and call patient to inform them. If patient already had an upcoming appointment within acceptable timeframe, please add "pre-op clearance" to the appointment notes so provider is aware. - Please contact requesting surgeon's office via preferred method (i.e, phone, fax) to inform them of need for appointment prior to surgery.  Per office protocol, patient can hold Eliquis for 3 days prior to procedure.   Patient will not need bridging with Lovenox (enoxaparin) around procedure.   Elgie Collard, PA-C  08/04/2022, 1:32 PM

## 2022-08-04 NOTE — Telephone Encounter (Signed)
Pt has been scheduled for a tele visit, 08/10/22.  Consent on file / medications reconciled.

## 2022-08-04 NOTE — Telephone Encounter (Signed)
Patient with diagnosis of atrial fibrillation on Eliquis for anticoagulation.    Procedure: C3-4 posterior cervical laminectomy Date of procedure: TBD   CHA2DS2-VASc Score = 1   This indicates a 0.6% annual risk of stroke. The patient's score is based upon: CHF History: 0 HTN History: 1 Diabetes History: 0 Stroke History: 0 Vascular Disease History: 0 Age Score: 0 Gender Score: 0       CrCl 98 Platelet count 164  Per office protocol, patient can hold Eliquis for 3 days prior to procedure.   Patient will not need bridging with Lovenox (enoxaparin) around procedure.  **This guidance is not considered finalized until pre-operative APP has relayed final recommendations.**

## 2022-08-07 NOTE — Progress Notes (Unsigned)
Virtual Visit via Telephone Note   Because of David Smith's co-morbid illnesses, he is at least at moderate risk for complications without adequate follow up.  This format is felt to be most appropriate for this patient at this time.  The patient did not have access to video technology/had technical difficulties with video requiring transitioning to audio format only (telephone).  All issues noted in this document were discussed and addressed.  No physical exam could be performed with this format.  Please refer to the patient's chart for his consent to telehealth for [facility].  Evaluation Performed:  Preoperative cardiovascular risk assessment _____________   Date:  08/07/2022   Patient ID:  David Smith, DOB 30-Oct-1955, MRN [id number] Patient Location:  Home Provider location:   Office  Primary Care Provider:  Colon Branch, MD Primary Cardiologist:  Evalina Field, MD  Chief Complaint / Patient Profile   67 y.o. y/o male with a h/o HOCM, PAF on chronic anticoagulation, HTN, dilated ascending aorta, who is pending C3-4 posterior cervical laminectomy and presents today for telephonic preoperative cardiovascular risk assessment.  History of Present Illness    David Smith is a 67 y.o. male who presents via audio/video conferencing for a telehealth visit today.  Pt was last seen in cardiology clinic on 06/12/22 by Dr. Audie Box.  At that time David Smith was advised to wear a 3-day Zio patch to exclude arrhythmias. Monitor revealed occasional PVCs, no sustained arrhythmias.   Follow-up echocardiogram was ordered and revealed normal LVEF, stable size of aortic aneurysm, overall stable result.  The patient is now pending procedure as outlined above. Since his last visit, he denies chest pain, shortness of breath, lower extremity edema, fatigue, palpitations, melena, hematuria, hemoptysis, diaphoresis, weakness, presyncope, syncope, orthopnea, and PND. He is somewhat limited  by cervical spine issues and was advised not to lift anything more than 5 lbs. He walks with a cane but does walk on the treadmill for exercise without concerning cardiac symptoms.    Past Medical History    Past Medical History:  Diagnosis Date   Chronic lower back pain 06/11/2012   daily a problem Pain level 7, Tylenol helpful(down to level 2)   Depression    Due to med for his cancer - Lupron   H/O cardiovascular stress test    non ischemic EF 55% 05-15-2007   Heart murmur    dx as a teen, Stress ECHO 2008 (-)    Hypertension    Hypertrophic obstructive cardiomyopathy (HOCM) (HCC)    PAF (paroxysmal atrial fibrillation) ([place])    Prostate cancer metastatic to intrapelvic lymph node (HCC)    gleason 4+3=7, volume 56.4 cc, PSA 34.25   Past Surgical History:  Procedure Laterality Date   ACHILLES TENDON REPAIR     26 yrs ago, right foot   ANAL FISSURECTOMY  2008   ANTERIOR CERVICAL DECOMP/DISCECTOMY FUSION N/A 01/05/2021   Procedure: Cervical Three Corpectomy;  Surgeon: Ashok Pall, MD;  Location: [facility];  Service: Neurosurgery;  Laterality: N/A;   dental procedure  06-11-12   minor -office procedure to correct jaw issue-used anesthesia gas to do   LYMPHADENECTOMY  06/17/2012   Procedure: LYMPHADENECTOMY;  Surgeon: [name], MD;  Location: [facility];  Service: Urology;  Laterality: Bilateral;   PROSTATE BIOPSY     ROBOT ASSISTED LAPAROSCOPIC RADICAL PROSTATECTOMY  06/17/2012   Procedure: ROBOTIC ASSISTED LAPAROSCOPIC RADICAL PROSTATECTOMY LEVEL 2;  Surgeon: [name], MD;  Location: [facility];  Service: Urology;  Laterality: N/A;       Allergies  No Known Allergies  Home Medications    Prior to Admission medications   Medication Sig Start Date End Date Taking? Authorizing Provider  ALPRAZolam (XANAX) 0.25 MG tablet Take 1-2 tablets (0.25-0.5 mg total) by mouth 2 (two) times daily as needed for anxiety. 11/22/21   Colon Branch, MD  Ascorbic Acid (VITAMIN C PO) Take 1 tablet by mouth  3 (three) times a week.    [provider]  Cyanocobalamin (VITAMIN B 12 PO) Take 1 tablet by mouth 3 (three) times a week.    [provider]  diltiazem (CARDIZEM CD) 240 MG 24 hr capsule Take 1 capsule (240 mg total) by mouth daily. 05/30/22   O'Neal, Cassie Freer, MD  ELIQUIS 5 MG TABS tablet TAKE 1 TABLET BY MOUTH TWICE DAILY . APPOINTMENT REQUIRED FOR FUTURE REFILLS 04/17/22   O'Neal, Cassie Freer, MD  meclizine (ANTIVERT) 25 MG tablet Take 1 tablet (25 mg total) by mouth 3 (three) times daily as needed for dizziness. 08/17/20   Colon Branch, MD  Multiple Vitamin (MULTIVITAMIN WITH MINERALS) TABS tablet Take 1 tablet by mouth daily.    [provider]  Omega-3 Fatty Acids (FISH OIL PO) Take 1 capsule by mouth once a week.    [provider]  rosuvastatin (CRESTOR) 20 MG tablet TAKE 1 TABLET BY MOUTH ONCE DAILY. PLEASE KEEP UPCOMING APPOINTMENT FOR FUTURE REFILLS. 07/03/22   O'Neal, Cassie Freer, MD  tiZANidine (ZANAFLEX) 4 MG tablet Take 1 tablet (4 mg total) by mouth every 6 (six) hours as needed for muscle spasms. 01/07/21   Ashok Pall, MD    Physical Exam    Vital Signs:  David Smith does not have vital signs available for review today.  Given telephonic nature of communication, physical exam is limited. AAOx3. NAD. Normal affect.  Speech and respirations are unlabored.  Accessory Clinical Findings    None  Assessment & Plan    1.  Preoperative Cardiovascular Risk Assessment:The patient is doing well from a cardiac perspective. Therefore, based on ACC/AHA guidelines, the patient would be at acceptable risk for the planned procedure without further cardiovascular testing. According to the Revised Cardiac Risk Index (RCRI), his Perioperative Risk of Major Cardiac Event is (%): 0.9. His Functional Capacity in METs is: 5.62 according to the [facility] Activity Status Index (DASI).  The patient was advised that if he develops new symptoms prior to  surgery to contact our office to arrange for a follow-up visit, and he verbalized understanding.  Per office protocol, patient can hold Eliquis for 3 days prior to procedure.   Patient will not need bridging with Lovenox (enoxaparin) around procedure.  A copy of this note will be routed to requesting surgeon.  Time:   Today, I have spent 10 minutes with the patient with telehealth technology discussing medical history, symptoms, and management plan.     Emmaline Life, NP-C  08/10/2022, 9:10 AM [address] Office [phone] Fax [phone]

## 2022-08-08 ENCOUNTER — Telehealth: Payer: Self-pay | Admitting: Radiation Oncology

## 2022-08-08 NOTE — Telephone Encounter (Signed)
Left message for patient to call back to schedule consult per 2/23 referral.

## 2022-08-10 ENCOUNTER — Encounter: Payer: Self-pay | Admitting: Nurse Practitioner

## 2022-08-10 ENCOUNTER — Ambulatory Visit: Payer: Medicare HMO | Attending: Cardiovascular Disease | Admitting: Nurse Practitioner

## 2022-08-10 DIAGNOSIS — Z01810 Encounter for preprocedural cardiovascular examination: Secondary | ICD-10-CM | POA: Diagnosis not present

## 2022-08-11 NOTE — Progress Notes (Incomplete)
GU Location of Tumor / Histology: Metastatic Prostate Ca  PSA is (1.56 on 06/2022)   07/24/2022 Dr. Raynelle Bring NM PET (PSMA) Skull Base to Mid Thigh CLINICAL DATA:  Prostate carcinoma with lymph node metastasis.  FINDINGS: NECK No radiotracer activity in neck lymph nodes.   Incidental CT finding: None.   CHEST Three intensely radiotracer avid lymph nodes present in the high mediastinum/thoracic inlet. These nodes are very small. Example 6 mm node just deep to the inferior lobe of the LEFT thyroid gland with SUV max equal 16.1 on image 78. Nodal tissue on the RIGHT at the same level is difficult define by CT but intensely radiotracer avid SUV max equal 23 (image 75). Tiny supraclavicular metastasis on the RIGHT with SUV max equal 72 on image 72 again this nodal tissue difficult define by CT.   Incidental CT finding: None.   ABDOMEN/PELVIS Prostate: No focal activity in prostatectomy bed.   Lymph nodes: Solitary radiotracer avid presacral lymph node/LEFT common iliac lymph node measures 6 mm with SUV max equal 12.3 on image 186.   No periaortic retroperitoneal radiotracer avid or enlarged lymph nodes.   Liver: No evidence of liver metastasis.   Incidental CT finding: None.   SKELETON No focal activity to suggest skeletal metastasis.   IMPRESSION: 1. Three small radiotracer avid lymph nodes in the high mediastinum/thoracic inlet consistent with prostate cancer nodal metastasis. 2. Single small radiotracer avid lymph node in the presacral space. 3. No evidence of local recurrence in the prostatectomy bed. 4. No evidence of visceral metastasis or skeletal metastasis.   Past/Anticipated interventions by urology, if any:   Blood work first then week later to see Dr. in April.   Past/Anticipated interventions by medical oncology, if any: NA  Weight changes, if any:  No  IPSS:  7 SHIM: 19  Bowel/Bladder complaints, if any:  No  Nausea/Vomiting, if any:  No  Pain  issues, if any:  0/10 not at the moment  SAFETY ISSUES: Prior radiation? Yes, 05/2013 [facility] Radiation therapy. Pacemaker/ICD? No Possible current pregnancy? Male Is the patient on methotrexate? No  Current Complaints / other details:  Surgery C2-3 coming up soon.  Cardiologist cleared him for surgery last week.

## 2022-08-15 ENCOUNTER — Ambulatory Visit
Admission: RE | Admit: 2022-08-15 | Discharge: 2022-08-15 | Disposition: A | Discharge status: Home / Self Care | Payer: Medicare HMO | Source: Ambulatory Visit | Attending: Radiation Oncology | Admitting: Radiation Oncology

## 2022-08-15 ENCOUNTER — Other Ambulatory Visit: Payer: Self-pay

## 2022-08-15 ENCOUNTER — Encounter: Payer: Self-pay | Admitting: Radiation Oncology

## 2022-08-15 VITALS — BP 160/82 | HR 61 | Temp 97.6°F | Resp 18 | Ht 72.0 in | Wt 251.5 lb

## 2022-08-15 DIAGNOSIS — C775 Secondary and unspecified malignant neoplasm of intrapelvic lymph nodes: Secondary | ICD-10-CM | POA: Diagnosis not present

## 2022-08-15 DIAGNOSIS — C771 Secondary and unspecified malignant neoplasm of intrathoracic lymph nodes: Secondary | ICD-10-CM | POA: Diagnosis not present

## 2022-08-15 DIAGNOSIS — Z923 Personal history of irradiation: Secondary | ICD-10-CM | POA: Diagnosis not present

## 2022-08-15 DIAGNOSIS — C61 Malignant neoplasm of prostate: Secondary | ICD-10-CM | POA: Diagnosis not present

## 2022-08-15 NOTE — Progress Notes (Signed)
Radiation Oncology         [phone]) [phone] ________________________________  Initial outpatient Consultation  Name: David Smith MRN: [id number]  Date of Service: 08/15/2022 DOB: Oct 11, 1955  CC:Paz, Alda Berthold, MD  Raynelle Bring, MD   REFERRING PHYSICIAN: Raynelle Bring, MD  DIAGNOSIS: 67 year old man with oligometastatic prostate cancer involving thoracic and pelvic lymph nodes with a current posttreatment PSA at 1.56    ICD-10-CM   1. Malignant neoplasm of prostate ([place])  C61       HISTORY OF PRESENT ILLNESS: David Smith is a 67 y.o. male seen at the request of Dr. Alinda Money.  He has known metastatic prostate cancer and is followed to our service having previously completed salvage fossa radiation in December 2014.  He has been off medical therapy, on intermittent ADT, since March 2022 due to dealing with some neurological issues as well as the loss of his wife last summer.  In brief summary, he was initially diagnosed with pT3a N0 Gleason 4+3 prostate cancer with a pretreatment PSA of 34.3, treated with RALP on 06/17/2012 which did reveal positive margins.  His postoperative PSA remained detectable and initially, he was hesitant to start salvage radiation but, the PSA began to rapidly increase, up to 0.23 in 03/2013, and he did complete salvage radiotherapy in December 2014.  His PSA initially stabilized but began rising again in August 2015, reaching 6.2 in March 2016.  On restaging imaging he was noted to have a solitary nodal mass just medial to the left common iliac and was started on ADT at that time.  His PSA responded nicely and was undetectable on repeat in December 2016 so the patient elected to stop ADT secondary to his severe hot flashes and mood swings (started intermittent ADT).  His disease restaging remained negative but the PSA increased to 13.6 in December 2020 so he resumed ADT at that time.  The PSA again became undetectable and remained undetectable through March 2022 at which  time he again stopped the ADT.  The PSA has been gradually rising since November 2022 when it was at 0.016.  The PSA increased to 0.13 in June 2023, 0.83 in November 2023 and 1.56 on his most recent labs June 16, 2022.  He had a PSMA PET scan on 07/24/2022 for disease restaging and this revealed 3 small tracer avid nodes in the high mediastinum/thoracic outlet as well as a single pelvic node in the presacral space. He has reviewed the lab and imaging findings with his urologist and has been kindly referred back to [place] today to discuss the potential role for radiotherapy in the management of his oligometastatic disease.  PREVIOUS RADIATION THERAPY: Yes  04/07/2013-06/02/2013:  the prostatic fossa was treated to 68.4 Gy in 38 fractions  PAST MEDICAL HISTORY:  Past Medical History:  Diagnosis Date   Chronic lower back pain 06/11/2012   daily a problem Pain level 7, Tylenol helpful(down to level 2)   Depression    Due to med for his cancer - Lupron   H/O cardiovascular stress test    non ischemic EF 55% 05-15-2007   Heart murmur    dx as a teen, Stress ECHO 2008 (-)    Hypertension    Hypertrophic obstructive cardiomyopathy (HOCM) (HCC)    PAF (paroxysmal atrial fibrillation) ([place])    Prostate cancer metastatic to intrapelvic lymph node (HCC)    gleason 4+3=7, volume 56.4 cc, PSA 34.25      PAST SURGICAL HISTORY: Past Surgical History:  Procedure Laterality Date   ACHILLES TENDON REPAIR     26 yrs ago, right foot   ANAL FISSURECTOMY  2008   ANTERIOR CERVICAL DECOMP/DISCECTOMY FUSION N/A 01/05/2021   Procedure: Cervical Three Corpectomy;  Surgeon: Ashok Pall, MD;  Location: [facility];  Service: Neurosurgery;  Laterality: N/A;   dental procedure  06-11-12   minor -office procedure to correct jaw issue-used anesthesia gas to do   LYMPHADENECTOMY  06/17/2012   Procedure: LYMPHADENECTOMY;  Surgeon: [name], MD;  Location: [facility];  Service: Urology;  Laterality: Bilateral;   PROSTATE BIOPSY      ROBOT ASSISTED LAPAROSCOPIC RADICAL PROSTATECTOMY  06/17/2012   Procedure: ROBOTIC ASSISTED LAPAROSCOPIC RADICAL PROSTATECTOMY LEVEL 2;  Surgeon: [name], MD;  Location: [facility];  Service: Urology;  Laterality: N/A;       FAMILY HISTORY:  Family History  Problem Relation Age of Onset   Cancer Mother        breast   Heart disease Mother    Cancer Father 56       prostate, removed   Cancer Brother 11       prostate, tx w/xrt   Colon cancer Neg Hx    Colon polyps Neg Hx     SOCIAL HISTORY:  Social History   Socioeconomic History   Marital status: Widowed    Spouse name: Not on file   Number of children: 3   Years of education: Not on file   Highest education level: Not on file  Occupational History   Occupation: disability d/t back pain-not working at present  Tobacco Use   Smoking status: Never   Smokeless tobacco: Never  Vaping Use   Vaping Use: Never used  Substance and Sexual Activity   Alcohol use: Not Currently    Alcohol/week: 0.0 standard drinks of alcohol   Drug use: No   Sexual activity: Yes  Other Topics Concern   Not on file  Social History Narrative   Household: pt, wife 66 y/o child    Right handed   Social Determinants of Health   Financial Resource Strain: Low Risk  (02/17/2021)   Overall Financial Resource Strain (CARDIA)    Difficulty of Paying Living Expenses: Not very hard  Food Insecurity: No Food Insecurity (02/17/2021)   Hunger Vital Sign    Worried About Running Out of Food in the Last Year: Never true    [place] in the Last Year: Never true  Transportation Needs: No Transportation Needs (02/17/2021)   PRAPARE - Hydrologist (Medical): No    Lack of Transportation (Non-Medical): No  Physical Activity: Sufficiently Active (02/17/2021)   Exercise Vital Sign    Days of Exercise per Week: 7 days    Minutes of Exercise per Session: 30 min  Stress: Stress Concern Present (02/17/2021)   [place]    Feeling of Stress : To some extent  Social Connections: Moderately Isolated (02/17/2021)   Social Connection and Isolation Panel [NHANES]    Frequency of Communication with Friends and Family: Once a week    Frequency of Social Gatherings with Friends and Family: Once a week    Attends Religious Services: More than 4 times per year    Active Member of [employer] or Organizations: No    Attends Archivist Meetings: Never    Marital Status: Married  Human resources officer Violence: Not At Risk (02/17/2021)   Humiliation, Afraid,  Rape, and Kick questionnaire    Fear of Current or Ex-Partner: No    Emotionally Abused: No    Physically Abused: No    Sexually Abused: No    ALLERGIES: Patient has no known allergies.  MEDICATIONS:  Current Outpatient Medications  Medication Sig Dispense Refill   ALPRAZolam (XANAX) 0.25 MG tablet Take 1-2 tablets (0.25-0.5 mg total) by mouth 2 (two) times daily as needed for anxiety. 20 tablet 0   Ascorbic Acid (VITAMIN C PO) Take 1 tablet by mouth 3 (three) times a week.     Cyanocobalamin (VITAMIN B 12 PO) Take 1 tablet by mouth 3 (three) times a week.     diltiazem (CARDIZEM CD) 240 MG 24 hr capsule Take 1 capsule (240 mg total) by mouth daily. 30 capsule 6   ELIQUIS 5 MG TABS tablet TAKE 1 TABLET BY MOUTH TWICE DAILY . APPOINTMENT REQUIRED FOR FUTURE REFILLS 180 tablet 0   meclizine (ANTIVERT) 25 MG tablet Take 1 tablet (25 mg total) by mouth 3 (three) times daily as needed for dizziness. 60 tablet 0   Multiple Vitamin (MULTIVITAMIN WITH MINERALS) TABS tablet Take 1 tablet by mouth daily.     Omega-3 Fatty Acids (FISH OIL PO) Take 1 capsule by mouth once a week.     rosuvastatin (CRESTOR) 20 MG tablet TAKE 1 TABLET BY MOUTH ONCE DAILY. PLEASE KEEP UPCOMING APPOINTMENT FOR FUTURE REFILLS. 60 tablet 0   tiZANidine (ZANAFLEX) 4 MG tablet Take 1 tablet (4 mg total) by mouth every 6 (six) hours as  needed for muscle spasms. 60 tablet 0   No current facility-administered medications for this encounter.    REVIEW OF SYSTEMS:  On review of systems, the patient reports that he is doing well overall. He denies any chest pain, shortness of breath, cough, fevers, chills, night sweats, unintended weight changes. He denies any bowel or bladder disturbances, and denies abdominal pain, nausea or vomiting. He denies any new musculoskeletal or joint aches or pains. A complete review of systems is obtained and is otherwise negative.    PHYSICAL EXAM:  Wt Readings from Last 3 Encounters:  06/12/22 252 lb (114.3 kg)  02/17/21 252 lb (114.3 kg)  02/08/21 252 lb 2 oz (114.4 kg)   Temp Readings from Last 3 Encounters:  02/08/21 98.4 F (36.9 C) (Oral)  01/07/21 98.4 F (36.9 C) (Oral)  12/29/20 98.4 F (36.9 C) (Oral)   BP Readings from Last 3 Encounters:  06/12/22 (!) 148/70  02/08/21 122/74  01/07/21 (!) 158/83   Pulse Readings from Last 3 Encounters:  06/12/22 77  02/08/21 60  01/07/21 (!) 56    /10  In general this is a well appearing African American man in no acute distress. He's alert and oriented x4 and appropriate throughout the examination. Cardiopulmonary assessment is negative for acute distress and he exhibits normal effort.   KPS = 100  100 - Normal; no complaints; no evidence of disease. 90   - Able to carry on normal activity; minor signs or symptoms of disease. 80   - Normal activity with effort; some signs or symptoms of disease. 16   - Cares for self; unable to carry on normal activity or to do active work. 60   - Requires occasional assistance, but is able to care for most of his personal needs. 50   - Requires considerable assistance and frequent medical care. 16   - Disabled; requires special care and assistance. 30   - Severely disabled;  hospital admission is indicated although death not imminent. 71   - Very sick; hospital admission necessary; active  supportive treatment necessary. 10   - Moribund; fatal processes progressing rapidly. 0     - Dead  Karnofsky DA, Abelmann Miller, Craver LS and Burchenal JH [phone]) The use of the nitrogen mustards in the palliative treatment of carcinoma: with particular reference to bronchogenic carcinoma Cancer 1 634-56  LABORATORY DATA:  Lab Results  Component Value Date   WBC 6.5 06/12/2022   HGB 14.2 06/12/2022   HCT 45.2 06/12/2022   MCV 83 06/12/2022   PLT 164 06/12/2022   Lab Results  Component Value Date   NA 145 (H) 06/12/2022   K 4.7 06/12/2022   CL 106 06/12/2022   CO2 24 06/12/2022   Lab Results  Component Value Date   ALT 24 06/12/2022   AST 20 06/12/2022   ALKPHOS 115 06/12/2022   BILITOT 0.3 06/12/2022     RADIOGRAPHY: NM PET (PSMA) SKULL TO MID THIGH  Result Date: 07/25/2022 CLINICAL DATA:  Prostate carcinoma with lymph node metastasis. EXAM: NUCLEAR MEDICINE PET SKULL BASE TO THIGH TECHNIQUE: 9.8 mCi F18 Piflufolastat (Pylarify) was injected intravenously. Full-ring PET imaging was performed from the skull base to thigh after the radiotracer. CT data was obtained and used for attenuation correction and anatomic localization. COMPARISON:  None Available. FINDINGS: NECK No radiotracer activity in neck lymph nodes. Incidental CT finding: None. CHEST Three intensely radiotracer avid lymph nodes present in the high mediastinum/thoracic inlet. These nodes are very small. Example 6 mm node just deep to the inferior lobe of the LEFT thyroid gland with SUV max equal 16.1 on image 78. Nodal tissue on the RIGHT at the same level is difficult define by CT but intensely radiotracer avid SUV max equal 23 (image 75). Tiny supraclavicular metastasis on the RIGHT with SUV max equal 72 on image 72 again this nodal tissue difficult define by CT. Incidental CT finding: None. ABDOMEN/PELVIS Prostate: No focal activity in prostatectomy bed. Lymph nodes: Solitary radiotracer avid presacral lymph node/LEFT  common iliac lymph node measures 6 mm with SUV max equal 12.3 on image 186. No periaortic retroperitoneal radiotracer avid or enlarged lymph nodes. Liver: No evidence of liver metastasis. Incidental CT finding: None. SKELETON No focal activity to suggest skeletal metastasis. IMPRESSION: 1. Three small radiotracer avid lymph nodes in the high mediastinum/thoracic inlet consistent with prostate cancer nodal metastasis. 2. Single small radiotracer avid lymph node in the presacral space. 3. No evidence of local recurrence in the prostatectomy bed. 4. No evidence of visceral metastasis or skeletal metastasis. Electronically Signed   By: Suzy Bouchard M.D.   On: 07/25/2022 13:10   LONG TERM MONITOR (3-14 DAYS)  Result Date: 07/24/2022 Patch Wear Time:  4 days and 7 hours (2024-01-30T13:17:09-498 to 2024-02-03T21:15:05-499) Patient had a min HR of 44 bpm (sinus bradycardia), max HR of 128 bpm (5 beats non-sustained ventricular tachycardia), and avg HR of 65 bpm (normal sinus rhythm). Predominant underlying rhythm was Sinus Rhythm. 1 run of non-sustained Ventricular Tachycardia occurred lasting 5 beats with a max rate of 128 bpm (avg 122 bpm). 1 run of Supraventricular Tachycardia occurred lasting 10 beats with a max rate of 114 bpm (avg 101 bpm). Isolated SVEs were rare (<1.0%), and no SVE Couplets or SVE Triplets were present. Isolated VEs were occasional (4.4%, 17589), VE Couplets were rare (<1.0%, 157), and VE Triplets were rare (<1.0%, 3). Ventricular Bigeminy and Trigeminy were present. No significant arrhythmias. Occasional  PVCs (4.4% burden). [place] T. Audie Box, MD, [facility] [address], [place], [place] [address] [phone] 7:09 PM     IMPRESSION/PLAN: 33. 67 y.o. man with oligometastatic prostate cancer involving thoracic and pelvic lymph nodes with a current posttreatment PSA at 1.56. Today, we talked to the patient about the findings and workup thus far. We  discussed the natural history of oligometastatic prostate cancer and general treatment, highlighting the role of radiotherapy in the management. We discussed the available radiation techniques, and focused on the details and logistics of delivery. The recommendation is for a 2 week course of daily ultra-hypofractionated radiation therapy (UHRT) to the involved lymph nodes in the chest and pelvis, including the adjacent nodal echelons. We reviewed the anticipated acute and late sequelae associated with radiation in this setting.  He has a good understanding of his disease and our treatment recommendations which are of curative intent. The patient was encouraged to ask questions that were answered to his stated satisfaction.  At the conclusion of our conversation, the patient elects to proceed with the recommended 2 week course of daily ultra-hypofractionated radiation therapy (UHRT) to the involved lymph nodes in the chest and pelvis, including the adjacent nodal echelons.  He has freely signed written consent to proceed today in the office and a copy of this document will be placed in his medical record.  We have tentatively scheduled him for CT simulation at 8 AM on Friday, 08/18/2022, in anticipation of beginning his daily treatments in the near future.  We will share our discussion with Dr. Alinda Money and proceed with treatment planning accordingly.  We enjoyed meeting with him again today and look forward to continuing to participate in his care.  He knows that he is welcome to call at anytime with any questions or concerns related to the radiation treatment plan.  We personally spent 60 minutes in this encounter including chart review, reviewing radiological studies, meeting face-to-face with the patient, entering orders and completing documentation.    Nicholos Johns, PA-C    Tyler Pita, MD  [facility] Direct Dial: [phone]  Fax: [phone] [email or web address]  Skype   LinkedIn

## 2022-08-15 NOTE — Progress Notes (Signed)
Introduced myself to the patient as the prostate nurse navigator.  He is here to discuss his radiation treatment options.  I gave him my business card and asked him to call me with questions or concerns.  Verbalized understanding.  

## 2022-08-16 ENCOUNTER — Encounter: Payer: Self-pay | Admitting: General Practice

## 2022-08-16 NOTE — Progress Notes (Signed)
[facility] Spiritual Care Note  Referred by nursing for additional layer of support. Phoned to introduce [facility]/Patient and Family Support, leaving voicemail with encouragement to return call.   [place], [place], [facility] Pager [phone] Voicemail [phone]

## 2022-08-17 ENCOUNTER — Inpatient Hospital Stay: Payer: Medicare HMO | Attending: Radiation Oncology

## 2022-08-17 NOTE — Progress Notes (Signed)
[facility] Work  Initial Assessment   David Smith is a 67 y.o. year old male contacted by phone. Clinical Social Work was referred by nurse navigator for assessment of psychosocial needs.   SDOH (Social Determinants of Health) assessments performed: Yes SDOH Interventions    Flowsheet Row CONSULT from 08/15/2022 in [facility] Radiation Oncology Clinical Support from 02/23/2022 in [facility] [place] Visit from 12/17/2020 in [facility] [place] Interventions     Utilities Interventions -- Intervention Not Indicated --  Depression Interventions/Treatment  Referral to Psychiatry, PHQ2-9 Score <4 Follow-up Not Indicated Counseling Counseling       [place]: No Food Insecurity (08/15/2022)  Housing: Low Risk  (08/15/2022)  Transportation Needs: No Transportation Needs (08/15/2022)  Utilities: Not At Risk (08/15/2022)  Alcohol Screen: Low Risk  (08/15/2022)  Depression (PHQ2-9): High Risk (08/15/2022)  Financial Resource Strain: Low Risk  (02/17/2021)  Physical Activity: Sufficiently Active (02/17/2021)  Social Connections: Moderately Isolated (02/17/2021)  Stress: Stress Concern Present (02/17/2021)  Tobacco Use: Low Risk  (08/15/2022)     Distress Screen completed: No     No data to display            Family/Social Information:  Housing Arrangement: patient lives with his 65 year old daughter.   His wife died last December 20, 2022. Family members/support persons in your life? Family and Friends Transportation concerns: no  Employment: Retired  Income source: Paediatric nurse concerns: No Type of concern: None Food access concerns: no Religious or spiritual practice: Not known Services Currently in place:  [employer]  Coping/ Adjustment to diagnosis: Patient understands treatment plan and what happens next? yes Concerns  about diagnosis and/or treatment:  Patient has received radiation previously. Patient reported stressors: Children and Adjusting to my illness Hopes and/or priorities: Family Patient enjoys gardening Current coping skills/ strengths: Capable of independent living , Armed forces logistics/support/administrative officer , Scientist, research (life sciences) , General fund of knowledge , Special hobby/interest , and Supportive family/friends     SUMMARY: Current SDOH Barriers:  Patient and his daughter grieving.  Clinical Social Work Clinical Goal(s):  Patient will work with SW to address concerns related to his illness.  Interventions: Discussed common feeling and emotions when being diagnosed with cancer, and the importance of support during treatment Informed patient of the support team roles and support services at [facility] Provided CSW contact information and encouraged patient to call with any questions or concerns Provided patient with information about the [facility].  Patient agreed to allow CSW to mail program literature and a list of therapists.   Follow Up Plan: CSW will follow-up with patient by phone  Patient verbalizes understanding of plan: Yes    Rodman Pickle Taysha Majewski, LCSW

## 2022-08-18 ENCOUNTER — Ambulatory Visit
Admission: RE | Admit: 2022-08-18 | Discharge: 2022-08-18 | Disposition: A | Discharge status: Home / Self Care | Payer: Medicare HMO | Source: Ambulatory Visit | Attending: Radiation Oncology | Admitting: Radiation Oncology

## 2022-08-18 DIAGNOSIS — C778 Secondary and unspecified malignant neoplasm of lymph nodes of multiple regions: Secondary | ICD-10-CM

## 2022-08-18 DIAGNOSIS — C61 Malignant neoplasm of prostate: Secondary | ICD-10-CM | POA: Diagnosis not present

## 2022-08-18 NOTE — Progress Notes (Signed)
  Radiation Oncology         [phone] ________________________________  Name: David Smith MRN: [id number]  Date: 08/18/2022  DOB: 1955-12-01  HYPOFRACTIONATED BODY RADIOTHERAPY SIMULATION AND TREATMENT PLANNING NOTE    ICD-10-CM   1. Prostate cancer ([place])  C61     2. Malignant neoplasm of prostate metastatic to lymph nodes of multiple sites [facility])  C61    C77.8       DIAGNOSIS:  67 year old man with oligometastatic prostate cancer involving thoracic and pelvic lymph nodes with a current posttreatment PSA at 1.56   NARRATIVE:  The patient was brought to the [facility].  Identity was confirmed.  All relevant records and images related to the planned course of therapy were reviewed.  The patient freely provided informed written consent to proceed with treatment after reviewing the details related to the planned course of therapy. The consent form was witnessed and verified by the simulation staff.  Then, the patient was set-up in a stable reproducible  supine position for radiation therapy.  A BodyFix immobilization pillow was fabricated for reproducible positioning.  Surface markings were placed.  The CT images were loaded into the planning software.  The gross target volumes (GTV) and planning target volumes (PTV) were delinieated, and avoidance structures were contoured.  Treatment planning then occurred.  The radiation prescription was entered and confirmed.  A total of two complex treatment devices were fabricated in the form of the BodyFix immobilization pillow and a neck accuform cushion.  I have requested : 3D Simulation  I have requested a DVH of the following structures: targets and all normal structures near the target including lungs, spinal cord, bowel, and others as noted on the radiation plan to maintain doses in adherence with established limits  SPECIAL TREATMENT PROCEDURE:  The planned course of therapy using radiation constitutes a special treatment  procedure. Special care is required in the management of this patient for the following reasons. High dose per fraction requiring special monitoring for increased toxicities of treatment including daily imaging..  The special nature of the planned course of radiotherapy will require increased physician supervision and oversight to ensure patient's safety with optimal treatment outcomes.    This requires extended time and effort.    PLAN:  The patient will receive 50 Gy in 10 fractions.  ________________________________  Sheral Apley Tammi Klippel, M.D.

## 2022-08-20 DIAGNOSIS — C775 Secondary and unspecified malignant neoplasm of intrapelvic lymph nodes: Secondary | ICD-10-CM | POA: Diagnosis not present

## 2022-08-20 DIAGNOSIS — C61 Malignant neoplasm of prostate: Secondary | ICD-10-CM | POA: Diagnosis not present

## 2022-08-20 DIAGNOSIS — C771 Secondary and unspecified malignant neoplasm of intrathoracic lymph nodes: Secondary | ICD-10-CM | POA: Diagnosis not present

## 2022-08-23 ENCOUNTER — Encounter: Payer: Self-pay | Admitting: General Practice

## 2022-08-23 NOTE — Progress Notes (Signed)
[facility] Spiritual Care Note  Followed up with David Smith by phone. He reports a good spiritual care team in friends from church and other parts of his life, which he notes is particularly important to him right now because he is grieving the loss of his wife of 42 years (she died in Nov 10, 2022). He also notes that, for now, 1:1 support is very helpful because he doesn't want to field other people's sorrows in a group setting.  Provided empathic listening, normalization of feelings, and affirmation of strengths. Ensured that he is aware of ongoing [place] team/programming availability, in case needs arise or circumstances change.   [place], [place], [facility] Pager [phone] Voicemail [phone]

## 2022-08-24 DIAGNOSIS — C775 Secondary and unspecified malignant neoplasm of intrapelvic lymph nodes: Secondary | ICD-10-CM | POA: Diagnosis not present

## 2022-08-24 DIAGNOSIS — C778 Secondary and unspecified malignant neoplasm of lymph nodes of multiple regions: Secondary | ICD-10-CM | POA: Diagnosis not present

## 2022-08-24 DIAGNOSIS — C771 Secondary and unspecified malignant neoplasm of intrathoracic lymph nodes: Secondary | ICD-10-CM | POA: Diagnosis not present

## 2022-08-24 DIAGNOSIS — C61 Malignant neoplasm of prostate: Secondary | ICD-10-CM | POA: Diagnosis not present

## 2022-08-31 ENCOUNTER — Other Ambulatory Visit: Payer: Self-pay | Admitting: Neurosurgery

## 2022-09-04 ENCOUNTER — Ambulatory Visit
Admission: RE | Admit: 2022-09-04 | Discharge: 2022-09-04 | Disposition: A | Discharge status: Home / Self Care | Payer: Medicare HMO | Source: Ambulatory Visit | Attending: Radiation Oncology | Admitting: Radiation Oncology

## 2022-09-04 ENCOUNTER — Other Ambulatory Visit: Payer: Self-pay

## 2022-09-04 DIAGNOSIS — Z510 Encounter for antineoplastic radiation therapy: Secondary | ICD-10-CM | POA: Diagnosis not present

## 2022-09-04 DIAGNOSIS — C778 Secondary and unspecified malignant neoplasm of lymph nodes of multiple regions: Secondary | ICD-10-CM | POA: Insufficient documentation

## 2022-09-04 DIAGNOSIS — C775 Secondary and unspecified malignant neoplasm of intrapelvic lymph nodes: Secondary | ICD-10-CM | POA: Diagnosis not present

## 2022-09-04 DIAGNOSIS — C61 Malignant neoplasm of prostate: Secondary | ICD-10-CM | POA: Diagnosis not present

## 2022-09-04 DIAGNOSIS — C771 Secondary and unspecified malignant neoplasm of intrathoracic lymph nodes: Secondary | ICD-10-CM | POA: Diagnosis not present

## 2022-09-04 LAB — RAD ONC ARIA SESSION SUMMARY
Course Elapsed Days: 0
Plan Fractions Treated to Date: 1
Plan Fractions Treated to Date: 1
Plan Prescribed Dose Per Fraction: 5 Gy
Plan Prescribed Dose Per Fraction: 5 Gy
Plan Total Fractions Prescribed: 10
Plan Total Fractions Prescribed: 10
Plan Total Prescribed Dose: 50 Gy
Plan Total Prescribed Dose: 50 Gy
Reference Point Dosage Given to Date: 5 Gy
Reference Point Dosage Given to Date: 5 Gy
Reference Point Session Dosage Given: 5 Gy
Reference Point Session Dosage Given: 5 Gy
Session Number: 1

## 2022-09-05 ENCOUNTER — Other Ambulatory Visit: Payer: Self-pay

## 2022-09-05 ENCOUNTER — Ambulatory Visit
Admission: RE | Admit: 2022-09-05 | Discharge: 2022-09-05 | Disposition: A | Discharge status: Home / Self Care | Payer: Medicare HMO | Source: Ambulatory Visit | Attending: Radiation Oncology | Admitting: Radiation Oncology

## 2022-09-05 DIAGNOSIS — C775 Secondary and unspecified malignant neoplasm of intrapelvic lymph nodes: Secondary | ICD-10-CM | POA: Diagnosis not present

## 2022-09-05 DIAGNOSIS — C771 Secondary and unspecified malignant neoplasm of intrathoracic lymph nodes: Secondary | ICD-10-CM | POA: Diagnosis not present

## 2022-09-05 DIAGNOSIS — Z510 Encounter for antineoplastic radiation therapy: Secondary | ICD-10-CM | POA: Diagnosis not present

## 2022-09-05 DIAGNOSIS — C778 Secondary and unspecified malignant neoplasm of lymph nodes of multiple regions: Secondary | ICD-10-CM | POA: Diagnosis not present

## 2022-09-05 DIAGNOSIS — C61 Malignant neoplasm of prostate: Secondary | ICD-10-CM | POA: Diagnosis not present

## 2022-09-05 LAB — RAD ONC ARIA SESSION SUMMARY
Course Elapsed Days: 1
Plan Fractions Treated to Date: 2
Plan Fractions Treated to Date: 2
Plan Prescribed Dose Per Fraction: 5 Gy
Plan Prescribed Dose Per Fraction: 5 Gy
Plan Total Fractions Prescribed: 10
Plan Total Fractions Prescribed: 10
Plan Total Prescribed Dose: 50 Gy
Plan Total Prescribed Dose: 50 Gy
Reference Point Dosage Given to Date: 10 Gy
Reference Point Dosage Given to Date: 10 Gy
Reference Point Session Dosage Given: 5 Gy
Reference Point Session Dosage Given: 5 Gy
Session Number: 2

## 2022-09-06 ENCOUNTER — Other Ambulatory Visit: Payer: Self-pay

## 2022-09-06 ENCOUNTER — Ambulatory Visit
Admission: RE | Admit: 2022-09-06 | Discharge: 2022-09-06 | Disposition: A | Discharge status: Home / Self Care | Payer: Medicare HMO | Source: Ambulatory Visit | Attending: Radiation Oncology | Admitting: Radiation Oncology

## 2022-09-06 DIAGNOSIS — C775 Secondary and unspecified malignant neoplasm of intrapelvic lymph nodes: Secondary | ICD-10-CM | POA: Diagnosis not present

## 2022-09-06 DIAGNOSIS — C778 Secondary and unspecified malignant neoplasm of lymph nodes of multiple regions: Secondary | ICD-10-CM | POA: Diagnosis not present

## 2022-09-06 DIAGNOSIS — Z510 Encounter for antineoplastic radiation therapy: Secondary | ICD-10-CM | POA: Diagnosis not present

## 2022-09-06 DIAGNOSIS — C771 Secondary and unspecified malignant neoplasm of intrathoracic lymph nodes: Secondary | ICD-10-CM | POA: Diagnosis not present

## 2022-09-06 DIAGNOSIS — C61 Malignant neoplasm of prostate: Secondary | ICD-10-CM | POA: Diagnosis not present

## 2022-09-06 LAB — RAD ONC ARIA SESSION SUMMARY
Course Elapsed Days: 2
Plan Fractions Treated to Date: 3
Plan Fractions Treated to Date: 3
Plan Prescribed Dose Per Fraction: 5 Gy
Plan Prescribed Dose Per Fraction: 5 Gy
Plan Total Fractions Prescribed: 10
Plan Total Fractions Prescribed: 10
Plan Total Prescribed Dose: 50 Gy
Plan Total Prescribed Dose: 50 Gy
Reference Point Dosage Given to Date: 15 Gy
Reference Point Dosage Given to Date: 15 Gy
Reference Point Session Dosage Given: 5 Gy
Reference Point Session Dosage Given: 5 Gy
Session Number: 3

## 2022-09-07 ENCOUNTER — Other Ambulatory Visit: Payer: Self-pay

## 2022-09-07 ENCOUNTER — Ambulatory Visit
Admission: RE | Admit: 2022-09-07 | Discharge: 2022-09-07 | Disposition: A | Discharge status: Home / Self Care | Payer: Medicare HMO | Source: Ambulatory Visit | Attending: Radiation Oncology | Admitting: Radiation Oncology

## 2022-09-07 DIAGNOSIS — C778 Secondary and unspecified malignant neoplasm of lymph nodes of multiple regions: Secondary | ICD-10-CM | POA: Diagnosis not present

## 2022-09-07 DIAGNOSIS — Z510 Encounter for antineoplastic radiation therapy: Secondary | ICD-10-CM | POA: Diagnosis not present

## 2022-09-07 DIAGNOSIS — C775 Secondary and unspecified malignant neoplasm of intrapelvic lymph nodes: Secondary | ICD-10-CM | POA: Diagnosis not present

## 2022-09-07 DIAGNOSIS — C771 Secondary and unspecified malignant neoplasm of intrathoracic lymph nodes: Secondary | ICD-10-CM | POA: Diagnosis not present

## 2022-09-07 DIAGNOSIS — C61 Malignant neoplasm of prostate: Secondary | ICD-10-CM | POA: Diagnosis not present

## 2022-09-07 LAB — RAD ONC ARIA SESSION SUMMARY
Course Elapsed Days: 3
Plan Fractions Treated to Date: 4
Plan Fractions Treated to Date: 4
Plan Prescribed Dose Per Fraction: 5 Gy
Plan Prescribed Dose Per Fraction: 5 Gy
Plan Total Fractions Prescribed: 10
Plan Total Fractions Prescribed: 10
Plan Total Prescribed Dose: 50 Gy
Plan Total Prescribed Dose: 50 Gy
Reference Point Dosage Given to Date: 20 Gy
Reference Point Dosage Given to Date: 20 Gy
Reference Point Session Dosage Given: 5 Gy
Reference Point Session Dosage Given: 5 Gy
Session Number: 4

## 2022-09-08 ENCOUNTER — Ambulatory Visit
Admission: RE | Admit: 2022-09-08 | Discharge: 2022-09-08 | Disposition: A | Discharge status: Home / Self Care | Payer: Medicare HMO | Source: Ambulatory Visit | Attending: Radiation Oncology | Admitting: Radiation Oncology

## 2022-09-08 ENCOUNTER — Ambulatory Visit
Admission: RE | Admit: 2022-09-08 | Discharge: 2022-09-08 | Disposition: A | Discharge status: Home / Self Care | Payer: Medicare HMO | Source: Ambulatory Visit | Attending: Radiation Oncology

## 2022-09-08 ENCOUNTER — Other Ambulatory Visit: Payer: Self-pay

## 2022-09-08 DIAGNOSIS — C775 Secondary and unspecified malignant neoplasm of intrapelvic lymph nodes: Secondary | ICD-10-CM | POA: Diagnosis not present

## 2022-09-08 DIAGNOSIS — C778 Secondary and unspecified malignant neoplasm of lymph nodes of multiple regions: Secondary | ICD-10-CM | POA: Diagnosis not present

## 2022-09-08 DIAGNOSIS — C61 Malignant neoplasm of prostate: Secondary | ICD-10-CM | POA: Diagnosis not present

## 2022-09-08 DIAGNOSIS — Z510 Encounter for antineoplastic radiation therapy: Secondary | ICD-10-CM | POA: Diagnosis not present

## 2022-09-08 DIAGNOSIS — C771 Secondary and unspecified malignant neoplasm of intrathoracic lymph nodes: Secondary | ICD-10-CM | POA: Diagnosis not present

## 2022-09-08 LAB — RAD ONC ARIA SESSION SUMMARY
Course Elapsed Days: 4
Plan Fractions Treated to Date: 5
Plan Fractions Treated to Date: 5
Plan Prescribed Dose Per Fraction: 5 Gy
Plan Prescribed Dose Per Fraction: 5 Gy
Plan Total Fractions Prescribed: 10
Plan Total Fractions Prescribed: 10
Plan Total Prescribed Dose: 50 Gy
Plan Total Prescribed Dose: 50 Gy
Reference Point Dosage Given to Date: 25 Gy
Reference Point Dosage Given to Date: 25 Gy
Reference Point Session Dosage Given: 5 Gy
Reference Point Session Dosage Given: 5 Gy
Session Number: 5

## 2022-09-11 ENCOUNTER — Ambulatory Visit
Admission: RE | Admit: 2022-09-11 | Discharge: 2022-09-11 | Disposition: A | Discharge status: Home / Self Care | Payer: Medicare HMO | Source: Ambulatory Visit | Attending: Radiation Oncology

## 2022-09-11 ENCOUNTER — Other Ambulatory Visit: Payer: Self-pay

## 2022-09-11 DIAGNOSIS — C61 Malignant neoplasm of prostate: Secondary | ICD-10-CM | POA: Diagnosis not present

## 2022-09-11 DIAGNOSIS — C775 Secondary and unspecified malignant neoplasm of intrapelvic lymph nodes: Secondary | ICD-10-CM | POA: Diagnosis not present

## 2022-09-11 DIAGNOSIS — C778 Secondary and unspecified malignant neoplasm of lymph nodes of multiple regions: Secondary | ICD-10-CM | POA: Diagnosis not present

## 2022-09-11 DIAGNOSIS — Z510 Encounter for antineoplastic radiation therapy: Secondary | ICD-10-CM | POA: Diagnosis not present

## 2022-09-11 DIAGNOSIS — C771 Secondary and unspecified malignant neoplasm of intrathoracic lymph nodes: Secondary | ICD-10-CM | POA: Diagnosis not present

## 2022-09-11 LAB — RAD ONC ARIA SESSION SUMMARY
Course Elapsed Days: 7
Plan Fractions Treated to Date: 6
Plan Fractions Treated to Date: 6
Plan Prescribed Dose Per Fraction: 5 Gy
Plan Prescribed Dose Per Fraction: 5 Gy
Plan Total Fractions Prescribed: 10
Plan Total Fractions Prescribed: 10
Plan Total Prescribed Dose: 50 Gy
Plan Total Prescribed Dose: 50 Gy
Reference Point Dosage Given to Date: 30 Gy
Reference Point Dosage Given to Date: 30 Gy
Reference Point Session Dosage Given: 5 Gy
Reference Point Session Dosage Given: 5 Gy
Session Number: 6

## 2022-09-11 LAB — PSA: PSA: 2.37

## 2022-09-12 ENCOUNTER — Other Ambulatory Visit: Payer: Self-pay

## 2022-09-12 ENCOUNTER — Ambulatory Visit
Admission: RE | Admit: 2022-09-12 | Discharge: 2022-09-12 | Disposition: A | Discharge status: Home / Self Care | Payer: Medicare HMO | Source: Ambulatory Visit | Attending: Radiation Oncology

## 2022-09-12 DIAGNOSIS — C775 Secondary and unspecified malignant neoplasm of intrapelvic lymph nodes: Secondary | ICD-10-CM | POA: Diagnosis not present

## 2022-09-12 DIAGNOSIS — Z510 Encounter for antineoplastic radiation therapy: Secondary | ICD-10-CM | POA: Diagnosis not present

## 2022-09-12 DIAGNOSIS — C771 Secondary and unspecified malignant neoplasm of intrathoracic lymph nodes: Secondary | ICD-10-CM | POA: Diagnosis not present

## 2022-09-12 DIAGNOSIS — C778 Secondary and unspecified malignant neoplasm of lymph nodes of multiple regions: Secondary | ICD-10-CM | POA: Diagnosis not present

## 2022-09-12 DIAGNOSIS — C61 Malignant neoplasm of prostate: Secondary | ICD-10-CM | POA: Diagnosis not present

## 2022-09-12 LAB — RAD ONC ARIA SESSION SUMMARY
Course Elapsed Days: 8
Plan Fractions Treated to Date: 7
Plan Fractions Treated to Date: 7
Plan Prescribed Dose Per Fraction: 5 Gy
Plan Prescribed Dose Per Fraction: 5 Gy
Plan Total Fractions Prescribed: 10
Plan Total Fractions Prescribed: 10
Plan Total Prescribed Dose: 50 Gy
Plan Total Prescribed Dose: 50 Gy
Reference Point Dosage Given to Date: 35 Gy
Reference Point Dosage Given to Date: 35 Gy
Reference Point Session Dosage Given: 5 Gy
Reference Point Session Dosage Given: 5 Gy
Session Number: 7

## 2022-09-13 ENCOUNTER — Ambulatory Visit
Admission: RE | Admit: 2022-09-13 | Discharge: 2022-09-13 | Disposition: A | Discharge status: Home / Self Care | Payer: Medicare HMO | Source: Ambulatory Visit | Attending: Radiation Oncology

## 2022-09-13 ENCOUNTER — Other Ambulatory Visit: Payer: Self-pay

## 2022-09-13 DIAGNOSIS — C61 Malignant neoplasm of prostate: Secondary | ICD-10-CM | POA: Diagnosis not present

## 2022-09-13 DIAGNOSIS — C775 Secondary and unspecified malignant neoplasm of intrapelvic lymph nodes: Secondary | ICD-10-CM | POA: Diagnosis not present

## 2022-09-13 DIAGNOSIS — C778 Secondary and unspecified malignant neoplasm of lymph nodes of multiple regions: Secondary | ICD-10-CM | POA: Diagnosis not present

## 2022-09-13 DIAGNOSIS — C771 Secondary and unspecified malignant neoplasm of intrathoracic lymph nodes: Secondary | ICD-10-CM | POA: Diagnosis not present

## 2022-09-13 LAB — RAD ONC ARIA SESSION SUMMARY
Course Elapsed Days: 9
Plan Fractions Treated to Date: 8
Plan Fractions Treated to Date: 8
Plan Prescribed Dose Per Fraction: 5 Gy
Plan Prescribed Dose Per Fraction: 5 Gy
Plan Total Fractions Prescribed: 10
Plan Total Fractions Prescribed: 10
Plan Total Prescribed Dose: 50 Gy
Plan Total Prescribed Dose: 50 Gy
Reference Point Dosage Given to Date: 40 Gy
Reference Point Dosage Given to Date: 40 Gy
Reference Point Session Dosage Given: 5 Gy
Reference Point Session Dosage Given: 5 Gy
Session Number: 8

## 2022-09-14 ENCOUNTER — Ambulatory Visit: Payer: Medicare HMO

## 2022-09-14 ENCOUNTER — Other Ambulatory Visit: Payer: Self-pay

## 2022-09-14 ENCOUNTER — Ambulatory Visit
Admission: RE | Admit: 2022-09-14 | Discharge: 2022-09-14 | Disposition: A | Discharge status: Home / Self Care | Payer: Medicare HMO | Source: Ambulatory Visit | Attending: Radiation Oncology | Admitting: Radiation Oncology

## 2022-09-14 DIAGNOSIS — C771 Secondary and unspecified malignant neoplasm of intrathoracic lymph nodes: Secondary | ICD-10-CM | POA: Diagnosis not present

## 2022-09-14 DIAGNOSIS — C61 Malignant neoplasm of prostate: Secondary | ICD-10-CM | POA: Diagnosis not present

## 2022-09-14 DIAGNOSIS — C775 Secondary and unspecified malignant neoplasm of intrapelvic lymph nodes: Secondary | ICD-10-CM | POA: Diagnosis not present

## 2022-09-14 DIAGNOSIS — C778 Secondary and unspecified malignant neoplasm of lymph nodes of multiple regions: Secondary | ICD-10-CM | POA: Diagnosis not present

## 2022-09-14 DIAGNOSIS — Z510 Encounter for antineoplastic radiation therapy: Secondary | ICD-10-CM | POA: Diagnosis not present

## 2022-09-14 LAB — RAD ONC ARIA SESSION SUMMARY
Course Elapsed Days: 10
Plan Fractions Treated to Date: 9
Plan Fractions Treated to Date: 9
Plan Prescribed Dose Per Fraction: 5 Gy
Plan Prescribed Dose Per Fraction: 5 Gy
Plan Total Fractions Prescribed: 10
Plan Total Fractions Prescribed: 10
Plan Total Prescribed Dose: 50 Gy
Plan Total Prescribed Dose: 50 Gy
Reference Point Dosage Given to Date: 45 Gy
Reference Point Dosage Given to Date: 45 Gy
Reference Point Session Dosage Given: 5 Gy
Reference Point Session Dosage Given: 5 Gy
Session Number: 9

## 2022-09-15 ENCOUNTER — Ambulatory Visit: Payer: Medicare HMO

## 2022-09-15 ENCOUNTER — Other Ambulatory Visit: Payer: Self-pay

## 2022-09-15 ENCOUNTER — Encounter: Payer: Self-pay | Admitting: Urology

## 2022-09-15 ENCOUNTER — Ambulatory Visit
Admission: RE | Admit: 2022-09-15 | Discharge: 2022-09-15 | Disposition: A | Discharge status: Home / Self Care | Payer: Medicare HMO | Source: Ambulatory Visit | Attending: Radiation Oncology

## 2022-09-15 ENCOUNTER — Ambulatory Visit
Admission: RE | Admit: 2022-09-15 | Discharge: 2022-09-15 | Disposition: A | Discharge status: Home / Self Care | Payer: Medicare HMO | Source: Ambulatory Visit | Attending: Radiation Oncology | Admitting: Radiation Oncology

## 2022-09-15 DIAGNOSIS — C771 Secondary and unspecified malignant neoplasm of intrathoracic lymph nodes: Secondary | ICD-10-CM | POA: Diagnosis not present

## 2022-09-15 DIAGNOSIS — C778 Secondary and unspecified malignant neoplasm of lymph nodes of multiple regions: Secondary | ICD-10-CM | POA: Diagnosis not present

## 2022-09-15 DIAGNOSIS — C775 Secondary and unspecified malignant neoplasm of intrapelvic lymph nodes: Secondary | ICD-10-CM | POA: Diagnosis not present

## 2022-09-15 DIAGNOSIS — Z510 Encounter for antineoplastic radiation therapy: Secondary | ICD-10-CM | POA: Diagnosis not present

## 2022-09-15 DIAGNOSIS — C61 Malignant neoplasm of prostate: Secondary | ICD-10-CM | POA: Diagnosis not present

## 2022-09-15 LAB — RAD ONC ARIA SESSION SUMMARY
Course Elapsed Days: 11
Plan Fractions Treated to Date: 10
Plan Fractions Treated to Date: 10
Plan Prescribed Dose Per Fraction: 5 Gy
Plan Prescribed Dose Per Fraction: 5 Gy
Plan Total Fractions Prescribed: 10
Plan Total Fractions Prescribed: 10
Plan Total Prescribed Dose: 50 Gy
Plan Total Prescribed Dose: 50 Gy
Reference Point Dosage Given to Date: 50 Gy
Reference Point Dosage Given to Date: 50 Gy
Reference Point Session Dosage Given: 5 Gy
Reference Point Session Dosage Given: 5 Gy
Session Number: 10

## 2022-09-15 NOTE — Progress Notes (Signed)
Voicemail left with Dr. Sueanne Margarita scheduler to have MD modify consent order so there are no abbreviations.

## 2022-09-15 NOTE — Pre-Procedure Instructions (Signed)
Surgical Instructions    Your procedure is scheduled on September 27, 2022.  Report to [facility] Main Entrance "A" at 5:30 A.M., then check in with the Admitting office.  Call this number if you have problems the morning of surgery:  [phone]  If you have any questions prior to your surgery date call [phone]: Open Monday-Friday 8am-4pm If you experience any cold or flu symptoms such as cough, fever, chills, shortness of breath, etc. between now and your scheduled surgery, please notify us at the above number.     Remember:  Do not eat or drink after midnight the night before your surgery    Take these medicines the morning of surgery with A SIP OF WATER:  diltiazem (CARDIZEM CD)   rosuvastatin (CRESTOR)    Follow your surgeon's instructions on when to stop ELIQUIS.  If no instructions were given by your surgeon then you will need to call the office to get those instructions.     As of today, STOP taking any Aspirin (unless otherwise instructed by your surgeon) Aleve, Naproxen, Ibuprofen, Motrin, Advil, Goody's, BC's, all herbal medications, fish oil, and all vitamins.                     Do NOT Smoke (Tobacco/Vaping) for 24 hours prior to your procedure.  If you use a CPAP at night, you may bring your mask/headgear for your overnight stay.   Contacts, glasses, piercing's, hearing aid's, dentures or partials may not be worn into surgery, please bring cases for these belongings.    For patients admitted to the hospital, discharge time will be determined by your treatment team.   Patients discharged the day of surgery will not be allowed to drive home, and someone needs to stay with them for 24 hours.  SURGICAL WAITING ROOM VISITATION Patients having surgery or a procedure may have no more than 2 support people in the waiting area - these visitors may rotate.   Children under the age of 27 must have an adult with them who is not the patient. If the patient needs to stay at  the hospital during part of their recovery, the visitor guidelines for inpatient rooms apply. Pre-op nurse will coordinate an appropriate time for 1 support person to accompany patient in pre-op.  This support person may not rotate.   Please refer to the [facility] website for the visitor guidelines for Inpatients (after your surgery is over and you are in a regular room).   Please follow the instructions on the handout you received at your pre-admission appointment about preparing for your upcoming surgery using the CHG surgical soap. If you have any questions or concerns, please call one of the numbers listed on the first page of this paperwork.   If you received a COVID test during your pre-op visit  it is requested that you wear a mask when out in public, stay away from anyone that may not be feeling well and notify your surgeon if you develop symptoms. If you have been in contact with anyone that has tested positive in the last 10 days please notify you surgeon.

## 2022-09-18 ENCOUNTER — Other Ambulatory Visit: Payer: Self-pay

## 2022-09-18 ENCOUNTER — Encounter (HOSPITAL_COMMUNITY): Payer: Self-pay

## 2022-09-18 ENCOUNTER — Encounter (HOSPITAL_COMMUNITY)
Admission: RE | Admit: 2022-09-18 | Discharge: 2022-09-18 | Disposition: A | Discharge status: Home / Self Care | Payer: Medicare HMO | Source: Ambulatory Visit | Attending: Neurosurgery | Admitting: Neurosurgery

## 2022-09-18 VITALS — BP 136/87 | HR 58 | Temp 98.5°F | Resp 18 | Ht 73.0 in | Wt 247.0 lb

## 2022-09-18 DIAGNOSIS — Z6832 Body mass index (BMI) 32.0-32.9, adult: Secondary | ICD-10-CM | POA: Diagnosis not present

## 2022-09-18 DIAGNOSIS — Z01812 Encounter for preprocedural laboratory examination: Secondary | ICD-10-CM | POA: Insufficient documentation

## 2022-09-18 DIAGNOSIS — Z01818 Encounter for other preprocedural examination: Secondary | ICD-10-CM

## 2022-09-18 DIAGNOSIS — M4802 Spinal stenosis, cervical region: Secondary | ICD-10-CM | POA: Diagnosis not present

## 2022-09-18 DIAGNOSIS — I480 Paroxysmal atrial fibrillation: Secondary | ICD-10-CM | POA: Diagnosis not present

## 2022-09-18 DIAGNOSIS — I2510 Atherosclerotic heart disease of native coronary artery without angina pectoris: Secondary | ICD-10-CM

## 2022-09-18 DIAGNOSIS — E669 Obesity, unspecified: Secondary | ICD-10-CM | POA: Diagnosis not present

## 2022-09-18 DIAGNOSIS — R011 Cardiac murmur, unspecified: Secondary | ICD-10-CM | POA: Insufficient documentation

## 2022-09-18 DIAGNOSIS — I10 Essential (primary) hypertension: Secondary | ICD-10-CM | POA: Diagnosis not present

## 2022-09-18 DIAGNOSIS — I421 Obstructive hypertrophic cardiomyopathy: Secondary | ICD-10-CM | POA: Diagnosis not present

## 2022-09-18 HISTORY — DX: Cardiac arrhythmia, unspecified: I49.9

## 2022-09-18 HISTORY — DX: Anemia, unspecified: D64.9

## 2022-09-18 LAB — BASIC METABOLIC PANEL
Anion gap: 7 (ref 5–15)
BUN: 15 mg/dL (ref 8–23)
CO2: 25 mmol/L (ref 22–32)
Calcium: 8.7 mg/dL — ABNORMAL LOW (ref 8.9–10.3)
Chloride: 107 mmol/L (ref 98–111)
Creatinine, Ser: 1.18 mg/dL (ref 0.61–1.24)
GFR, Estimated: >60 mL/min (ref >60)
Glucose, Bld: 90 mg/dL (ref 70–99)
Potassium: 4.1 mmol/L (ref 3.5–5.1)
Sodium: 139 mmol/L (ref 135–145)

## 2022-09-18 LAB — SURGICAL PCR SCREEN
MRSA, PCR: NEGATIVE
Staphylococcus aureus: NEGATIVE

## 2022-09-18 LAB — CBC
HCT: 41.3 % (ref 39.0–52.0)
Hemoglobin: 13.4 g/dL (ref 13.0–17.0)
MCH: 27.1 pg (ref 26.0–34.0)
MCHC: 32.4 g/dL (ref 30.0–36.0)
MCV: 83.4 fL (ref 80.0–100.0)
Platelets: 104 10*3/uL — ABNORMAL LOW (ref 150–400)
RBC: 4.95 MIL/uL (ref 4.22–5.81)
RDW: 13.5 % (ref 11.5–15.5)
WBC: 3.3 10*3/uL — ABNORMAL LOW (ref 4.0–10.5)
nRBC: 0 % (ref 0.0–0.2)

## 2022-09-18 LAB — NO BLOOD PRODUCTS

## 2022-09-18 NOTE — Progress Notes (Signed)
PCP - Dr. Willow Ora Cardiologist - Dr. Lennie Odor Oncology - Dr. Margaretmary Dys Urologist - Dr. Heloise Purpura (Prostate CA. Prostate removed 2014, but CA returned after removal. Underwent radiation which worked for a short amount of time, but CA has returned again in his abdomen and lungs. He just finished his last radiation treatment 4/12)  PPM/ICD - Denies Device Orders - n/a Rep Notified - n/a  Chest x-ray - n/a EKG - 06/12/2022 Stress Test - 06/08/2020 ECHO - 07/07/2022 Cardiac Cath - Denies  Sleep Study - Denies CPAP - n/a  No DM  Last dose of GLP1 agonist- n/a GLP1 instructions: n/a  Blood Thinner Instructions: Last dose of Eliquis 4/20.  Aspirin Instructions:n/a  NPO after midnight  COVID TEST- n/a   Anesthesia review: Yes. Cardiac clearance    Patient denies shortness of breath, fever, cough and chest pain at PAT appointment. Pt denies any respiratory illness/infection in the last two months.   All instructions explained to the patient, with a verbal understanding of the material. Patient agrees to go over the instructions while at home for a better understanding. Patient also instructed to self quarantine after being tested for COVID-19. The opportunity to ask questions was provided.

## 2022-09-19 NOTE — Anesthesia Preprocedure Evaluation (Addendum)
Anesthesia Evaluation  Patient identified by MRN, date of birth, ID band Patient awake    Reviewed: Allergy & Precautions, NPO status , Patient's Chart, lab work & pertinent test results  Airway Mallampati: II  TM Distance: >3 FB Neck ROM: Limited    Dental  (+) Dental Advisory Given, Teeth Intact, Chipped   Pulmonary neg pulmonary ROS   Pulmonary exam normal breath sounds clear to auscultation       Cardiovascular hypertension, Pt. on medications + dysrhythmias + Valvular Problems/Murmurs  Rhythm:Regular Rate:Normal  Echo 07/07/22: IMPRESSIONS  1. Severe hypertrophy of basal septum; mild LVOT gradient at rest 1.4 m/s  increasing to 1.8 m/s with valsalva (findings c/w HCM); moderately dilated  ascending aorta (4.6 cm).  LVIDd:     5.00 cm  Diastology  LVIDs:     3.40 cm  LV e' medial:  4.38 cm/s  LV PW:     1.20 cm  LV E/e' medial: 10.6  LV IVS:    2.20 cm  LV e' lateral:  9.73 cm/s  LVOT diam:   2.60 cm  LV E/e' lateral: 4.8  LV SV:     131  LV SV Index:  56  LVOT Area:   5.31 cm  2. Left ventricular ejection fraction, by estimation, is 60 to 65%. The  left ventricle has normal function. The left ventricle has no regional  wall motion abnormalities. There is severe left ventricular hypertrophy of  the basal-septal segment. Left  ventricular diastolic parameters are consistent with Grade I diastolic  dysfunction (impaired relaxation).  3. Right ventricular systolic function is normal. The right ventricular  size is normal. There is normal pulmonary artery systolic pressure.  4. Left atrial size was moderately dilated.  5. The mitral valve is normal in structure. Mild mitral valve  regurgitation. No evidence of mitral stenosis.  6. The aortic valve is tricuspid. Aortic valve regurgitation is mild to  moderate. No aortic stenosis is present.  LVOT Vmax:  107.00 cm/s  LVOT Vmean:  67.100 cm/s  LVOT VTI:  0.247 m  AI PHT:   432 msec  7. Aortic dilatation noted. There is borderline dilatation of the aortic  root, measuring 39 mm. There is moderate dilatation of the ascending  aorta, measuring 46 mm.  8. The inferior vena cava is normal in size with greater than 50%  respiratory variability, suggesting right atrial pressure of 3 mmHg.     Neuro/Psych  PSYCHIATRIC DISORDERS  Depression    negative neurological ROS     GI/Hepatic negative GI ROS, Neg liver ROS,,,  Endo/Other  negative endocrine ROS    Renal/GU negative Renal ROS     Musculoskeletal  (+) Arthritis ,  Cervical stenosis   Abdominal   Peds  Hematology  (+) Blood dyscrasia, anemia   Anesthesia Other Findings   Reproductive/Obstetrics                             Anesthesia Physical Anesthesia Plan  ASA: 4  Anesthesia Plan: General   Post-op Pain Management: Tylenol PO (pre-op)* and Gabapentin PO (pre-op)*   Induction: Intravenous  PONV Risk Score and Plan: 3 and Ondansetron, Dexamethasone, Treatment may vary due to age or medical condition and Midazolam  Airway Management Planned: Oral ETT and Video Laryngoscope Planned  Additional Equipment: Arterial line  Intra-op Plan:   Post-operative Plan: Extubation in OR  Informed Consent: I have reviewed the patients History and Physical,  chart, labs and discussed the procedure including the risks, benefits and alternatives for the proposed anesthesia with the patient or authorized representative who has indicated his/her understanding and acceptance.     Dental advisory given  Plan Discussed with: CRNA  Anesthesia Plan Comments: (2 x PIV  PAT note written 09/19/2022 by Shonna Chock, PA-C. History of HOCM and PAF followed by Dr. Flora Lipps. Signed refusal for blood products.  )        Anesthesia Quick Evaluation

## 2022-09-19 NOTE — Progress Notes (Addendum)
Anesthesia Chart Review:  Case: [id number] Date/Time: 09/27/22 0715   Procedure: Post Cervical Lami - C3 - C4   Anesthesia type: General   Pre-op diagnosis: Stenosis   [facility] ROOM 20 / [facility]   Surgeons: Coletta Memos, MD       DISCUSSION: Patient is a 67 year old male scheduled for the above procedure.  History includes never smoker, HTN, hypertrophic obstructive cardiomyopathy (HCOM, septum/sigmoid septum variant), murmur, afib (02/2020), dilated ascending aorta (46 mm 07/2022 echo), chronic back pain, prostate cancer (s/p radical prostatectomy 06/17/12, completed salvage radiation 05/2013; ADT  09/2014 for left iliac node & 05/2019-08/2020 after rising PSA; 07/24/22 PET scan c/w nodal metastasis to high mediastinum/thoracic inlet, s/p radiation completed 09/15/22; post-radiation colitis/cystitis), spinal surgery (C3 anterior corpectomy, C2-4 arthrodesis 01/05/21). BMI is consistent with obesity.   He is followed by Dr. Flora Lipps for [facility] and PAF. Stable echo findings 07/07/22 (see below). Last virtual visit 08/10/22 with Eligha Bridegroom, NP for preoperative evaluation. He denied CV symptoms. Activity somewhat limited by cervical stenosis, but able to walk on the treadmill. She wrote, "Preoperative Cardiovascular Risk Assessment:The patient is doing well from a cardiac perspective. Therefore, based on ACC/AHA guidelines, the patient would be at acceptable risk for the planned procedure without further cardiovascular testing. According to the Revised Cardiac Risk Index (RCRI), his Perioperative Risk of Major Cardiac Event is (%): 0.9. His Functional Capacity in METs is: 5.62 according to the [facility] Activity Status Index (DASI)... Per office protocol, patient can hold Eliquis for 3 days prior to procedure.   Patient will not need bridging with Lovenox (enoxaparin) around procedure." -He reported last Eliquis is scheduled for 09/23/2022.  He has signed refusal of all blood products form. H/H 13.4/41.3 on  09/18/22.    Anesthesia team to evaluate on the day of surgery.   VS: BP 136/87   Pulse (!) 58   Temp 36.9 C   Resp 18   Ht 6\' 1"  (1.854 m)   Wt 112 kg   SpO2 100%   BMI 32.59 kg/m    PROVIDERS: Wanda Plump, MD is PCP  Lennie Odor, MD is cardiologist Heloise Purpura, MD is urologist Margaretmary Dys, MD is RAD-ONC   LABS: Labs reviewed: Acceptable for surgery. (all labs ordered are listed, but only abnormal results are displayed)  Labs Reviewed  BASIC METABOLIC PANEL - Abnormal; Notable for the following components:      Result Value   Calcium 8.7 (*)    All other components within normal limits  CBC - Abnormal; Notable for the following components:   WBC 3.3 (*)    Platelets 104 (*)    All other components within normal limits  SURGICAL PCR SCREEN  NO BLOOD PRODUCTS   He signed a refusal of all blood products form.     IMAGES: PET Scan 07/24/22: IMPRESSION: 1. Three small radiotracer avid lymph nodes in the high mediastinum/thoracic inlet consistent with prostate cancer nodal metastasis. 2. Single small radiotracer avid lymph node in the presacral space. 3. No evidence of local recurrence in the prostatectomy bed. 4. No evidence of visceral metastasis or skeletal metastasis.  MRI C-spine 06/17/22: IMPRESSION: 1. Surgical changes from prior C2-C4 fusion with corpectomy. No complicating features. 2. Residual ossification of the posterior longitudinal ligament at C2-3 and C3-4 with mild mass effect on the ventral thecal sac and anterior cord. 3. Stable mild to moderate spinal stenosis and mild bilateral foraminal stenosis at C4-5. 4. Stable mild spinal and moderate bilateral foraminal  stenosis at C5-6. 5. Stable significant left foraminal stenosis at C6-7. 6. Stable focal central disc protrusion at C7-T1 with mass effect on the ventral thecal sac and slight flattening of the anterior cervical cord.     EKG: 06/12/22:  Sinus rhythm with occasional  premature ventricular complexes Left anterior fascicular block Voltage criteria for left ventricular hypertrophy    CV: Echo 07/07/22: IMPRESSIONS   1. Severe hypertrophy of basal septum; mild LVOT gradient at rest 1.4 m/s  increasing to 1.8 m/s with valsalva (findings c/w HCM); moderately dilated  ascending aorta (4.6 cm).  LVIDd:         5.00 cm   Diastology  LVIDs:         3.40 cm   LV e' medial:    4.38 cm/s  LV PW:         1.20 cm   LV E/e' medial:  10.6  LV IVS:        2.20 cm   LV e' lateral:   9.73 cm/s  LVOT diam:     2.60 cm   LV E/e' lateral: 4.8  LV SV:         131  LV SV Index:   56  LVOT Area:     5.31 cm   2. Left ventricular ejection fraction, by estimation, is 60 to 65%. The  left ventricle has normal function. The left ventricle has no regional  wall motion abnormalities. There is severe left ventricular hypertrophy of  the basal-septal segment. Left  ventricular diastolic parameters are consistent with Grade I diastolic  dysfunction (impaired relaxation).   3. Right ventricular systolic function is normal. The right ventricular  size is normal. There is normal pulmonary artery systolic pressure.   4. Left atrial size was moderately dilated.   5. The mitral valve is normal in structure. Mild mitral valve  regurgitation. No evidence of mitral stenosis.   6. The aortic valve is tricuspid. Aortic valve regurgitation is mild to  moderate. No aortic stenosis is present.  LVOT Vmax:   107.00 cm/s  LVOT Vmean:  67.100 cm/s  LVOT VTI:    0.247 m  AI PHT:      432 msec   7. Aortic dilatation noted. There is borderline dilatation of the aortic  root, measuring 39 mm. There is moderate dilatation of the ascending  aorta, measuring 46 mm.   8. The inferior vena cava is normal in size with greater than 50%  respiratory variability, suggesting right atrial pressure of 3 mmHg.  - Comparison echo 03/02/20: LVEF 70-75%, no RWMA, severe LVH of the basal-septal segment that  measures 2.5 cm, there is LVOT obstruction at rest  consistent with HOCM. Peak gradient across the LVOT is 57 mmHg, grade I diastolic dysfunction, elevated left ventricular end-diastolic pressure, normal RVSF, SAM of the anterior MV leaflet, trivial MR/AR, ascending aorta 41 mm, findings c/w HOCM.    Long term monitor 07/04/22 - 07/09/22:  Patient had a min HR of 44 bpm (sinus bradycardia), max HR of 128 bpm (5 beats non-sustained ventricular tachycardia), and avg HR of 65 bpm (normal sinus rhythm). Predominant underlying rhythm was Sinus Rhythm. 1 run of non-sustained Ventricular Tachycardia occurred lasting 5 beats with a max rate of 128 bpm (avg 122 bpm). 1 run of Supraventricular Tachycardia occurred lasting 10 beats with a max rate of 114 bpm (avg 101 bpm). Isolated SVEs were rare (<1.0%), and no SVE Couplets or SVE Triplets were present. Isolated VEs were  occasional (4.4%, 17589), VE Couplets were rare (<1.0%, 157), and VE Triplets were rare (<1.0%, 3). Ventricular Bigeminy and Trigeminy were present.  No significant arrhythmias.  Occasional PVCs (4.4% burden).    Stress Echo 06/08/20: IMPRESSIONS:  1. Baseline echo with normal LV function, mild AI, severe basal septal  hypertrophy, chordal SAM and baseline gradient 1.6 m/s and peak gradient  10 mmHg (with valsalva); with peak HR, peak velocity 2.9 m/s and peak  gradient 34 mmHg.   2. This is a negative stress echocardiogram for ischemia.   3. The findings of the study demonstrate that a stress-induced outflow  tract obstruction is present (peak LVOT gradient 34 mmHG).   4. This is an indeterminate risk study      Cardiac MRI 03/03/20: IMPRESSION: 1. Asymmetric LV hypertrophy measuring up to 23mm in basal septum (9mm in posterior wall), consistent with hypertrophic cardiomyopathy 2. RV insertion site late gadolinium enhancement, consistent with HCM. LGE accounts for 2% of total myocardial mass 3.  Normal LV size with hyperdynamic systolic  function (EF 74%) 4.  Normal RV size and systolic function (EF 65%) 5.  Dilated ascending aorta measuring 44mm     Past Medical History:  Diagnosis Date   Anemia    Post radiation, bloody urine when he drinks/eats acidic things   Chronic lower back pain 06/11/2012   daily a problem Pain level 7, Tylenol helpful(down to level 2)   Depression    Due to med for his cancer - Lupron   Dysrhythmia    A. Fib   H/O cardiovascular stress test    non ischemic EF 55% 05-15-2007   Heart murmur    dx as a teen, Stress ECHO 2008 (-)    Hypertension    Hypertrophic obstructive cardiomyopathy (HOCM)    PAF (paroxysmal atrial fibrillation)    Prostate cancer metastatic to intrapelvic lymph node    gleason 4+3=7, volume 56.4 cc, PSA 34.25    Past Surgical History:  Procedure Laterality Date   ACHILLES TENDON REPAIR     28 yrs ago, right foot   ANAL FISSURECTOMY  2008   ANTERIOR CERVICAL DECOMP/DISCECTOMY FUSION N/A 01/05/2021   Procedure: Cervical Three Corpectomy;  Surgeon: Coletta Memos, MD;  Location: [facility] OR;  Service: Neurosurgery;  Laterality: N/A;   COLONOSCOPY W/ POLYPECTOMY  2017   dental procedure  06/11/2012   minor -office procedure to correct jaw issue-used anesthesia gas to do   EYE SURGERY Left 2004   piece of small metal removed from eye   LYMPHADENECTOMY  06/17/2012   Procedure: LYMPHADENECTOMY;  Surgeon: Crecencio Mc, MD;  Location: [facility] ORS;  Service: Urology;  Laterality: Bilateral;   PROSTATE BIOPSY     ROBOT ASSISTED LAPAROSCOPIC RADICAL PROSTATECTOMY  06/17/2012   Procedure: ROBOTIC ASSISTED LAPAROSCOPIC RADICAL PROSTATECTOMY LEVEL 2;  Surgeon: Crecencio Mc, MD;  Location: [facility] ORS;  Service: Urology;  Laterality: N/A;       MEDICATIONS:  ALPRAZolam (XANAX) 0.25 MG tablet   diltiazem (CARDIZEM CD) 240 MG 24 hr capsule   ELIQUIS 5 MG TABS tablet   meclizine (ANTIVERT) 25 MG tablet   Multiple Vitamin (MULTIVITAMIN WITH MINERALS) TABS tablet   rosuvastatin (CRESTOR) 20 MG  tablet   tiZANidine (ZANAFLEX) 4 MG tablet   No current facility-administered medications for this encounter.    Shonna Chock, PA-C Surgical Short Stay/Anesthesiology [facility] Phone [phone] [facility] Phone [phone] 09/19/2022 4:46 PM

## 2022-09-20 DIAGNOSIS — C61 Malignant neoplasm of prostate: Secondary | ICD-10-CM | POA: Diagnosis not present

## 2022-09-22 ENCOUNTER — Encounter: Payer: Self-pay | Admitting: Internal Medicine

## 2022-09-23 ENCOUNTER — Other Ambulatory Visit: Payer: Self-pay | Admitting: Cardiovascular Disease

## 2022-09-25 NOTE — Telephone Encounter (Signed)
Rx request sent to pharmacy.  

## 2022-09-27 ENCOUNTER — Ambulatory Visit (HOSPITAL_COMMUNITY): Payer: Medicare HMO

## 2022-09-27 ENCOUNTER — Other Ambulatory Visit: Payer: Self-pay

## 2022-09-27 ENCOUNTER — Ambulatory Visit (HOSPITAL_BASED_OUTPATIENT_CLINIC_OR_DEPARTMENT_OTHER): Payer: Medicare HMO | Admitting: Anesthesiology

## 2022-09-27 ENCOUNTER — Ambulatory Visit (HOSPITAL_COMMUNITY)
Admission: RE | Disposition: A | Discharge status: Home / Self Care | Payer: Self-pay | Source: Home / Self Care | Attending: Neurosurgery

## 2022-09-27 ENCOUNTER — Encounter (HOSPITAL_COMMUNITY): Payer: Self-pay | Admitting: Neurosurgery

## 2022-09-27 ENCOUNTER — Ambulatory Visit (HOSPITAL_COMMUNITY): Payer: Medicare HMO | Admitting: Vascular Surgery

## 2022-09-27 ENCOUNTER — Ambulatory Visit (HOSPITAL_COMMUNITY)
Admission: RE | Admit: 2022-09-27 | Discharge: 2022-09-30 | Disposition: A | Discharge status: Home / Self Care | Payer: Medicare HMO | Attending: Neurosurgery | Admitting: Neurosurgery

## 2022-09-27 DIAGNOSIS — M4802 Spinal stenosis, cervical region: Secondary | ICD-10-CM | POA: Diagnosis present

## 2022-09-27 DIAGNOSIS — I10 Essential (primary) hypertension: Secondary | ICD-10-CM

## 2022-09-27 DIAGNOSIS — D649 Anemia, unspecified: Secondary | ICD-10-CM | POA: Diagnosis not present

## 2022-09-27 DIAGNOSIS — I080 Rheumatic disorders of both mitral and aortic valves: Secondary | ICD-10-CM

## 2022-09-27 DIAGNOSIS — Z981 Arthrodesis status: Secondary | ICD-10-CM | POA: Diagnosis not present

## 2022-09-27 HISTORY — PX: POSTERIOR CERVICAL LAMINECTOMY: SHX2248

## 2022-09-27 SURGERY — POSTERIOR CERVICAL LAMINECTOMY
Anesthesia: General

## 2022-09-27 MED ORDER — ACETAMINOPHEN 650 MG RE SUPP: 650.0000 mg | RECTAL | Status: DC | PRN

## 2022-09-27 MED ORDER — GLYCOPYRROLATE PF 0.2 MG/ML IJ SOSY
PREFILLED_SYRINGE | INTRAMUSCULAR | Status: AC
  Filled 2022-09-27: qty 1

## 2022-09-27 MED ORDER — SODIUM CHLORIDE 0.9% FLUSH: 3.0000 mL | INTRAVENOUS | Status: DC | PRN

## 2022-09-27 MED ORDER — ACETAMINOPHEN 325 MG PO TABS
650.0000 mg | ORAL_TABLET | ORAL | Status: DC | PRN
  Administered 2022-09-27 – 2022-09-30 (×6): 650 mg via ORAL
  Filled 2022-09-27 (×6): qty 2

## 2022-09-27 MED ORDER — ROCURONIUM BROMIDE 10 MG/ML (PF) SYRINGE
PREFILLED_SYRINGE | INTRAVENOUS | Status: AC
  Filled 2022-09-27: qty 10

## 2022-09-27 MED ORDER — SODIUM CHLORIDE 0.9% FLUSH: 3.0000 mL | Freq: Two times a day (BID) | INTRAVENOUS | Status: DC

## 2022-09-27 MED ORDER — PROPOFOL 10 MG/ML IV BOLUS
INTRAVENOUS | Status: AC
  Filled 2022-09-27: qty 20

## 2022-09-27 MED ORDER — ROSUVASTATIN CALCIUM 20 MG PO TABS
20.0000 mg | ORAL_TABLET | Freq: Every day | ORAL | Status: DC
  Administered 2022-09-28 – 2022-09-30 (×3): 20 mg via ORAL
  Filled 2022-09-27 (×3): qty 1

## 2022-09-27 MED ORDER — PHENYLEPHRINE 80 MCG/ML (10ML) SYRINGE FOR IV PUSH (FOR BLOOD PRESSURE SUPPORT)
PREFILLED_SYRINGE | INTRAVENOUS | Status: DC | PRN
  Administered 2022-09-27 (×3): 80 ug via INTRAVENOUS
  Administered 2022-09-27 (×2): 160 ug via INTRAVENOUS

## 2022-09-27 MED ORDER — FENTANYL CITRATE (PF) 250 MCG/5ML IJ SOLN
INTRAMUSCULAR | Status: DC | PRN
  Administered 2022-09-27: 100 ug via INTRAVENOUS
  Administered 2022-09-27 (×2): 50 ug via INTRAVENOUS

## 2022-09-27 MED ORDER — BACITRACIN ZINC 500 UNIT/GM EX OINT
TOPICAL_OINTMENT | CUTANEOUS | Status: DC | PRN
  Administered 2022-09-27: 1 via TOPICAL

## 2022-09-27 MED ORDER — CHLORHEXIDINE GLUCONATE 0.12 % MT SOLN
15.0000 mL | Freq: Once | OROMUCOSAL | Status: AC
  Administered 2022-09-27: 15 mL via OROMUCOSAL
  Filled 2022-09-27: qty 15

## 2022-09-27 MED ORDER — MORPHINE SULFATE (PF) 4 MG/ML IV SOLN: 4.0000 mg | INTRAVENOUS | Status: DC | PRN

## 2022-09-27 MED ORDER — ZOLPIDEM TARTRATE 5 MG PO TABS: 5.0000 mg | ORAL_TABLET | Freq: Every evening | ORAL | Status: DC | PRN

## 2022-09-27 MED ORDER — BUPIVACAINE HCL (PF) 0.5 % IJ SOLN
INTRAMUSCULAR | Status: AC
  Filled 2022-09-27: qty 30

## 2022-09-27 MED ORDER — PROMETHAZINE HCL 25 MG/ML IJ SOLN: 6.2500 mg | INTRAMUSCULAR | Status: DC | PRN

## 2022-09-27 MED ORDER — METHYLPREDNISOLONE ACETATE 80 MG/ML IJ SUSP
INTRAMUSCULAR | Status: AC
  Filled 2022-09-27: qty 1

## 2022-09-27 MED ORDER — 0.9 % SODIUM CHLORIDE (POUR BTL) OPTIME
TOPICAL | Status: DC | PRN
  Administered 2022-09-27: 1000 mL

## 2022-09-27 MED ORDER — ONDANSETRON HCL 4 MG PO TABS
4.0000 mg | ORAL_TABLET | Freq: Four times a day (QID) | ORAL | Status: DC | PRN
  Administered 2022-09-28: 4 mg via ORAL
  Filled 2022-09-27: qty 1

## 2022-09-27 MED ORDER — THROMBIN (RECOMBINANT) 5000 UNITS EX SOLR
CUTANEOUS | Status: DC | PRN
  Administered 2022-09-27: 10 mL via TOPICAL

## 2022-09-27 MED ORDER — ROCURONIUM BROMIDE 10 MG/ML (PF) SYRINGE
PREFILLED_SYRINGE | INTRAVENOUS | Status: DC | PRN
  Administered 2022-09-27: 30 mg via INTRAVENOUS
  Administered 2022-09-27: 70 mg via INTRAVENOUS

## 2022-09-27 MED ORDER — LIDOCAINE 2% (20 MG/ML) 5 ML SYRINGE
INTRAMUSCULAR | Status: DC | PRN
  Administered 2022-09-27: 20 mg via INTRAVENOUS
  Administered 2022-09-27: 80 mg via INTRAVENOUS
  Administered 2022-09-27: 20 mg via INTRAVENOUS

## 2022-09-27 MED ORDER — CEFAZOLIN SODIUM-DEXTROSE 2-4 GM/100ML-% IV SOLN
2.0000 g | INTRAVENOUS | Status: AC
  Administered 2022-09-27: 2 g via INTRAVENOUS
  Filled 2022-09-27: qty 100

## 2022-09-27 MED ORDER — MEPERIDINE HCL 25 MG/ML IJ SOLN: 6.2500 mg | INTRAMUSCULAR | Status: DC | PRN

## 2022-09-27 MED ORDER — SUGAMMADEX SODIUM 200 MG/2ML IV SOLN
INTRAVENOUS | Status: DC | PRN
  Administered 2022-09-27: 200 mg via INTRAVENOUS

## 2022-09-27 MED ORDER — FENTANYL CITRATE (PF) 250 MCG/5ML IJ SOLN
INTRAMUSCULAR | Status: AC
  Filled 2022-09-27: qty 5

## 2022-09-27 MED ORDER — AMISULPRIDE (ANTIEMETIC) 5 MG/2ML IV SOLN: 10.0000 mg | Freq: Once | INTRAVENOUS | Status: DC | PRN

## 2022-09-27 MED ORDER — DEXAMETHASONE SODIUM PHOSPHATE 10 MG/ML IJ SOLN
INTRAMUSCULAR | Status: DC | PRN
  Administered 2022-09-27: 10 mg via INTRAVENOUS

## 2022-09-27 MED ORDER — CELECOXIB 200 MG PO CAPS
200.0000 mg | ORAL_CAPSULE | Freq: Two times a day (BID) | ORAL | Status: DC
  Administered 2022-09-27 – 2022-09-30 (×7): 200 mg via ORAL
  Filled 2022-09-27 (×7): qty 1

## 2022-09-27 MED ORDER — ONDANSETRON HCL 4 MG/2ML IJ SOLN: 4.0000 mg | Freq: Four times a day (QID) | INTRAMUSCULAR | Status: DC | PRN

## 2022-09-27 MED ORDER — HYDROMORPHONE HCL 1 MG/ML IJ SOLN
0.2500 mg | INTRAMUSCULAR | Status: DC | PRN
  Administered 2022-09-27: 0.5 mg via INTRAVENOUS

## 2022-09-27 MED ORDER — ONDANSETRON HCL 4 MG/2ML IJ SOLN
INTRAMUSCULAR | Status: AC
  Filled 2022-09-27: qty 2

## 2022-09-27 MED ORDER — LIDOCAINE-EPINEPHRINE 0.5 %-1:200000 IJ SOLN
INTRAMUSCULAR | Status: DC | PRN
  Administered 2022-09-27: 10 mL

## 2022-09-27 MED ORDER — DIAZEPAM 5 MG PO TABS
5.0000 mg | ORAL_TABLET | Freq: Four times a day (QID) | ORAL | Status: DC | PRN
  Administered 2022-09-27 – 2022-09-29 (×2): 5 mg via ORAL
  Filled 2022-09-27 (×3): qty 1

## 2022-09-27 MED ORDER — SODIUM CHLORIDE 0.9 % IV SOLN: 250.0000 mL | INTRAVENOUS | Status: DC

## 2022-09-27 MED ORDER — DILTIAZEM HCL ER COATED BEADS 240 MG PO CP24
240.0000 mg | ORAL_CAPSULE | Freq: Every day | ORAL | Status: DC
  Administered 2022-09-28 – 2022-09-30 (×2): 240 mg via ORAL
  Filled 2022-09-27 (×3): qty 1

## 2022-09-27 MED ORDER — PHENYLEPHRINE 80 MCG/ML (10ML) SYRINGE FOR IV PUSH (FOR BLOOD PRESSURE SUPPORT)
PREFILLED_SYRINGE | INTRAVENOUS | Status: AC
  Filled 2022-09-27: qty 10

## 2022-09-27 MED ORDER — CHLORHEXIDINE GLUCONATE CLOTH 2 % EX PADS: 6.0000 | MEDICATED_PAD | Freq: Once | CUTANEOUS | Status: DC

## 2022-09-27 MED ORDER — POTASSIUM CHLORIDE IN NACL 20-0.9 MEQ/L-% IV SOLN: INTRAVENOUS | Status: DC

## 2022-09-27 MED ORDER — PROPOFOL 10 MG/ML IV BOLUS
INTRAVENOUS | Status: DC | PRN
  Administered 2022-09-27: 150 mg via INTRAVENOUS

## 2022-09-27 MED ORDER — MIDAZOLAM HCL 2 MG/2ML IJ SOLN
INTRAMUSCULAR | Status: AC
  Filled 2022-09-27: qty 2

## 2022-09-27 MED ORDER — DEXAMETHASONE SODIUM PHOSPHATE 10 MG/ML IJ SOLN
INTRAMUSCULAR | Status: AC
  Filled 2022-09-27: qty 1

## 2022-09-27 MED ORDER — LIDOCAINE-EPINEPHRINE 0.5 %-1:200000 IJ SOLN
INTRAMUSCULAR | Status: AC
  Filled 2022-09-27: qty 50

## 2022-09-27 MED ORDER — LACTATED RINGERS IV SOLN: INTRAVENOUS | Status: DC

## 2022-09-27 MED ORDER — MIDAZOLAM HCL 2 MG/2ML IJ SOLN
INTRAMUSCULAR | Status: DC | PRN
  Administered 2022-09-27: 2 mg via INTRAVENOUS

## 2022-09-27 MED ORDER — PHENOL 1.4 % MT LIQD: 1.0000 | OROMUCOSAL | Status: DC | PRN

## 2022-09-27 MED ORDER — THROMBIN 5000 UNITS EX SOLR
CUTANEOUS | Status: AC
  Filled 2022-09-27: qty 10000

## 2022-09-27 MED ORDER — BACITRACIN ZINC 500 UNIT/GM EX OINT
TOPICAL_OINTMENT | CUTANEOUS | Status: AC
  Filled 2022-09-27: qty 28.35

## 2022-09-27 MED ORDER — ADULT MULTIVITAMIN W/MINERALS CH
1.0000 | ORAL_TABLET | Freq: Every day | ORAL | Status: DC
  Administered 2022-09-28 – 2022-09-30 (×3): 1 via ORAL
  Filled 2022-09-27 (×3): qty 1

## 2022-09-27 MED ORDER — MENTHOL 3 MG MT LOZG: 1.0000 | LOZENGE | OROMUCOSAL | Status: DC | PRN

## 2022-09-27 MED ORDER — LIDOCAINE 2% (20 MG/ML) 5 ML SYRINGE
INTRAMUSCULAR | Status: AC
  Filled 2022-09-27: qty 5

## 2022-09-27 MED ORDER — ORAL CARE MOUTH RINSE: 15.0000 mL | Freq: Once | OROMUCOSAL | Status: AC

## 2022-09-27 MED ORDER — OXYCODONE HCL 5 MG PO TABS: 5.0000 mg | ORAL_TABLET | ORAL | Status: DC | PRN

## 2022-09-27 MED ORDER — OXYCODONE HCL ER 10 MG PO T12A
10.0000 mg | EXTENDED_RELEASE_TABLET | Freq: Two times a day (BID) | ORAL | Status: DC
  Administered 2022-09-27 – 2022-09-29 (×5): 10 mg via ORAL
  Filled 2022-09-27 (×5): qty 1

## 2022-09-27 MED ORDER — ONDANSETRON HCL 4 MG/2ML IJ SOLN
INTRAMUSCULAR | Status: DC | PRN
  Administered 2022-09-27: 4 mg via INTRAVENOUS

## 2022-09-27 MED ORDER — GLYCOPYRROLATE PF 0.2 MG/ML IJ SOSY
PREFILLED_SYRINGE | INTRAMUSCULAR | Status: DC | PRN
  Administered 2022-09-27 (×2): .1 mg via INTRAVENOUS

## 2022-09-27 MED ORDER — BUPIVACAINE HCL (PF) 0.5 % IJ SOLN
INTRAMUSCULAR | Status: DC | PRN
  Administered 2022-09-27: 20 mL

## 2022-09-27 MED ORDER — SODIUM CHLORIDE 0.9 % IV SOLN: INTRAVENOUS | Status: DC | PRN

## 2022-09-27 MED ORDER — OXYCODONE HCL 5 MG PO TABS
10.0000 mg | ORAL_TABLET | ORAL | Status: DC | PRN
  Administered 2022-09-27 – 2022-09-29 (×3): 10 mg via ORAL
  Filled 2022-09-27 (×3): qty 2

## 2022-09-27 MED ORDER — PHENYLEPHRINE HCL-NACL 20-0.9 MG/250ML-% IV SOLN
INTRAVENOUS | Status: DC | PRN
  Administered 2022-09-27: 20 ug/min via INTRAVENOUS

## 2022-09-27 MED ORDER — HYDROMORPHONE HCL 1 MG/ML IJ SOLN
INTRAMUSCULAR | Status: AC
  Filled 2022-09-27: qty 1

## 2022-09-27 MED ORDER — THROMBIN 5000 UNITS EX SOLR
OROMUCOSAL | Status: DC | PRN
  Administered 2022-09-27: 5 mL via TOPICAL

## 2022-09-27 MED ORDER — THROMBIN 5000 UNITS EX SOLR
CUTANEOUS | Status: AC
  Filled 2022-09-27: qty 5000

## 2022-09-27 SURGICAL SUPPLY — 56 items
ALCOHOL 70% 16 OZ (MISCELLANEOUS) ×1 IMPLANT
APL SKNCLS STERI-STRIP NONHPOA (GAUZE/BANDAGES/DRESSINGS) ×1
BAG COUNTER SPONGE SURGICOUNT (BAG) ×1 IMPLANT
BAG SPNG CNTER NS LX DISP (BAG) ×1
BAND INSRT 18 STRL LF DISP RB (MISCELLANEOUS) ×2
BAND RUBBER #18 3X1/16 STRL (MISCELLANEOUS) ×2 IMPLANT
BENZOIN TINCTURE PRP APPL 2/3 (GAUZE/BANDAGES/DRESSINGS) ×1 IMPLANT
BIT DRILL NEURO 2X3.1 SFT TUCH (MISCELLANEOUS) IMPLANT
BLADE CLIPPER SURG (BLADE) IMPLANT
BLADE ULTRA TIP 2M (BLADE) IMPLANT
BUR MATCHSTICK NEURO 3.0 LAGG (BURR) IMPLANT
BUR PRECISION FLUTE 5.0 (BURR) IMPLANT
CANISTER SUCT 3000ML PPV (MISCELLANEOUS) ×1 IMPLANT
DRAPE C-ARM 42X72 X-RAY (DRAPES) IMPLANT
DRAPE LAPAROTOMY 100X72 PEDS (DRAPES) ×1 IMPLANT
DRAPE MICROSCOPE SLANT 54X150 (MISCELLANEOUS) ×1 IMPLANT
DRILL NEURO 2X3.1 SOFT TOUCH (MISCELLANEOUS)
DRSG OPSITE POSTOP 4X6 (GAUZE/BANDAGES/DRESSINGS) IMPLANT
DURAPREP 6ML APPLICATOR 50/CS (WOUND CARE) ×1 IMPLANT
ELECT REM PT RETURN 9FT ADLT (ELECTROSURGICAL) ×1
ELECTRODE REM PT RTRN 9FT ADLT (ELECTROSURGICAL) ×1 IMPLANT
GAUZE 4X4 16PLY ~~LOC~~+RFID DBL (SPONGE) IMPLANT
GLOVE ECLIPSE 6.5 STRL STRAW (GLOVE) ×1 IMPLANT
GLOVE EXAM NITRILE XL STR (GLOVE) IMPLANT
GOWN STRL REUS W/ TWL LRG LVL3 (GOWN DISPOSABLE) ×2 IMPLANT
GOWN STRL REUS W/ TWL XL LVL3 (GOWN DISPOSABLE) IMPLANT
GOWN STRL REUS W/TWL 2XL LVL3 (GOWN DISPOSABLE) IMPLANT
GOWN STRL REUS W/TWL LRG LVL3 (GOWN DISPOSABLE) ×2
GOWN STRL REUS W/TWL XL LVL3 (GOWN DISPOSABLE)
GRAFT DURAGEN MATRIX 1WX1L (Tissue) IMPLANT
HEMOSTAT POWDER KIT SURGIFOAM (HEMOSTASIS) IMPLANT
KIT BASIN OR (CUSTOM PROCEDURE TRAY) ×1 IMPLANT
KIT TURNOVER KIT B (KITS) ×1 IMPLANT
MARKER SKIN DUAL TIP RULER LAB (MISCELLANEOUS) ×1 IMPLANT
NDL HYPO 25X1 1.5 SAFETY (NEEDLE) ×1 IMPLANT
NEEDLE HYPO 25X1 1.5 SAFETY (NEEDLE) ×1 IMPLANT
NS IRRIG 1000ML POUR BTL (IV SOLUTION) ×1 IMPLANT
PACK LAMINECTOMY NEURO (CUSTOM PROCEDURE TRAY) ×1 IMPLANT
PATTIES SURGICAL .25X.25 (GAUZE/BANDAGES/DRESSINGS) IMPLANT
PIN MAYFIELD SKULL DISP (PIN) ×1 IMPLANT
SOL ELECTROSURG ANTI STICK (MISCELLANEOUS) ×1
SOLUTION ELECTROSURG ANTI STCK (MISCELLANEOUS) ×1 IMPLANT
SPIKE FLUID TRANSFER (MISCELLANEOUS) ×1 IMPLANT
SPONGE SURGIFOAM ABS GEL SZ50 (HEMOSTASIS) ×1 IMPLANT
STAPLER SKIN PROX WIDE 3.9 (STAPLE) ×1 IMPLANT
STRIP CLOSURE SKIN 1/2X4 (GAUZE/BANDAGES/DRESSINGS) ×1 IMPLANT
SUT ETHILON 3 0 FSL (SUTURE) IMPLANT
SUT VIC AB 0 CT1 18XCR BRD8 (SUTURE) ×1 IMPLANT
SUT VIC AB 0 CT1 8-18 (SUTURE) ×1
SUT VIC AB 2-0 CT1 18 (SUTURE) ×1 IMPLANT
SUT VIC AB 3-0 SH 8-18 (SUTURE) ×1 IMPLANT
TAPE CLOTH 3X10 TAN LF (GAUZE/BANDAGES/DRESSINGS) ×1 IMPLANT
TOWEL GREEN STERILE (TOWEL DISPOSABLE) ×1 IMPLANT
TOWEL GREEN STERILE FF (TOWEL DISPOSABLE) ×1 IMPLANT
UNDERPAD 30X36 HEAVY ABSORB (UNDERPADS AND DIAPERS) ×1 IMPLANT
WATER STERILE IRR 1000ML POUR (IV SOLUTION) ×1 IMPLANT

## 2022-09-27 NOTE — Transfer of Care (Signed)
Immediate Anesthesia Transfer of Care Note  Patient: David Smith  Procedure(s) Performed: Posterior Cervical Laminectomy - Cervical Three - Cervical Four  Patient Location: PACU  Anesthesia Type:General  Level of Consciousness: drowsy  Airway & Oxygen Therapy: Patient Spontanous Breathing  Post-op Assessment: Report given to RN, Post -op Vital signs reviewed and stable, and Patient moving all extremities X 4  Post vital signs: Reviewed and stable  Last Vitals:  Vitals Value Taken Time  BP 113/99 09/27/22 1233  Temp    Pulse 70 09/27/22 1240  Resp 15 09/27/22 1240  SpO2 97 % 09/27/22 1240  Vitals shown include unvalidated device data.  Last Pain:  Vitals:   09/27/22 0617  TempSrc:   PainSc: 0-No pain      Patients Stated Pain Goal: 0 (09/27/22 0617)  Complications: There were no known notable events for this encounter.

## 2022-09-27 NOTE — Anesthesia Procedure Notes (Signed)
Arterial Line Insertion Start/End4/24/2024 7:00 AM, 09/27/2022 7:05 AM Performed by: [name], MD, CRNA  Patient location: Pre-op. Preanesthetic checklist: patient identified, IV checked, site marked, risks and benefits discussed, surgical consent, monitors and equipment checked, pre-op evaluation, timeout performed and anesthesia consent Lidocaine 1% used for infiltration Left, radial was placed Catheter size: 20 G Hand hygiene performed  and maximum sterile barriers used   Attempts: 1 Procedure performed without using ultrasound guided technique. Following insertion, dressing applied. Post procedure assessment: normal and unchanged  Patient tolerated the procedure well with no immediate complications.

## 2022-09-27 NOTE — Anesthesia Procedure Notes (Signed)
Procedure Name: Intubation Date/Time: 09/27/2022 9:07 AM  Performed by: Sharyn Dross, CRNAPre-anesthesia Checklist: Patient identified, Emergency Drugs available, Suction available and Patient being monitored Patient Re-evaluated:Patient Re-evaluated prior to induction Oxygen Delivery Method: Circle system utilized Preoxygenation: Pre-oxygenation with 100% oxygen Induction Type: IV induction Ventilation: Mask ventilation without difficulty Laryngoscope Size: Glidescope and 4 Grade View: Grade I Tube type: Oral Tube size: 7.5 mm Number of attempts: 1 Airway Equipment and Method: Oral airway and Rigid stylet Placement Confirmation: ETT inserted through vocal cords under direct vision, positive ETCO2 and breath sounds checked- equal and bilateral Secured at: 22 cm Tube secured with: Tape Dental Injury: Teeth and Oropharynx as per pre-operative assessment

## 2022-09-27 NOTE — H&P (Signed)
BP (!) 155/84   Pulse (!) 59   Temp 99.1 F (37.3 C) (Oral)   Resp 18   Ht  (1.854 m)   Wt 112 kg   SpO2 95%   BMI 32.59 kg/m   Mr. Pennella is a gentleman whom I had last seen in May of 2017.  At that time, he had fairly profound stenosis at C2-3, 3-4 secondary to an outside posterior longitudinal ligament and a large bone spur posteriorly.  The cord had abnormal signal.  He clearly was myelopathic at that time, and I strongly recommended that he undergo operative decompression, which I planned at that time to do from a posterior approach.  Mr. Selley, however, disappeared from the practice and he returns today.  He, then in 2017 and today, complains of vertigo and dizziness.  He again restates that he has no cervical pain, that he has no pain in his arms, he does still have some pain in his lower back.  He states that he was seen by a neurologist who felt that he should be referred back to me because a recent MRI done in April of this year showed at the top of the cervical spine the cord compression.  No dedicated study was ordered of the cervical spine, which I would certainly need, and he was never evaluated for the vertigo.  He states that the tests from 5 years ago were assessed, and the referring physician felt it was best that he see me for the cervical cord compression.  I explained to Mr. Christianson that certainly his balance and gait would absolutely be affected by the cord compression, but that the cord compression was not in any way causing vertigo.   Mr. Stein has had no bowel or bladder dysfunction.  He does have a history of prostate cancer, which has been treated both by surgery, Lupron, and radiation.  He states that he was scared to get the operation when I initially saw him, because he was told he did not have long to live secondary to the prostate cancer.  Obviously, that prediction was wrong and he happily is quite alive today.  He walks with a cane and his balance is quite poor.    PHYSICAL EXAMINATION : He is alert, oriented by 4.  He answers all questions appropriately.  Memory, language, attention span, and fund of knowledge are normal.  Pupils equal, round, and reactive to light.  No nystagmus is noted.  Symmetric facial movements and sensation.  He has 5/5 strength in the upper and lower extremities.  He has on a button shirt, so his dexterity remains good enough.  He had 1+ reflex at the right knee, 2-3 at the left knee, 2+ reflexes in the upper extremities.  He had no clonus.  Mild Hoffmann sign bilaterally.  Romberg test was surprisingly good.  He weighs 261 pounds.  Temperature is 97.8, blood pressure 139/77, pulse 68.  He has no pain.    Mr. Cho returns with the MRI of the cervical spine. It still shows significant compression of the spinal canal at C2-3 and C3-4.  He underwent a C3 corpectomy.  He has a solid arthrodesis from August of 2022, but he needs a posterior decompression there.  There is absolutely no reason to go back anteriorly, because that would mean drilling out all of the bone, taking out the hardware and the like, but a posterior decompression should achieve what is necessary at that level and since he is  already arthrodesed, no more hardware should be placed.   PHYSICAL EXAMINATION : Vital signs: He weighs 249 pounds.  Temperature is 97.4, blood pressure 135/87, pulse is 59.  Musculoskeletal: He walks with a cane for stability.  Muscle tone, bulk, coordination are otherwise good.  He has normal muscle tone.  Neurologic: Speech is clear and fluent. Tongue and uvula in the midline.

## 2022-09-27 NOTE — Progress Notes (Signed)
A Line removed pressure applied dry pressure dressing in place patient tolerated procedure

## 2022-09-28 ENCOUNTER — Encounter (HOSPITAL_COMMUNITY): Payer: Self-pay | Admitting: Neurosurgery

## 2022-09-28 DIAGNOSIS — M4802 Spinal stenosis, cervical region: Secondary | ICD-10-CM | POA: Diagnosis not present

## 2022-09-28 MED ORDER — BISACODYL 5 MG PO TBEC
5.0000 mg | DELAYED_RELEASE_TABLET | Freq: Every day | ORAL | Status: DC | PRN
  Administered 2022-09-28: 5 mg via ORAL
  Filled 2022-09-28 (×2): qty 1

## 2022-09-28 MED FILL — Thrombin For Soln 5000 Unit: CUTANEOUS | Qty: 2 | Status: AC

## 2022-09-28 NOTE — Evaluation (Signed)
Physical Therapy Evaluation  Patient Details Name: David Smith MRN: [id number] DOB: 1956-05-13 Today's Date: 09/28/2022  History of Present Illness  Pt is a 67 yo male s/p posterior Cervical Laminectomy 3-4  Clinical Impression  Pt admitted with above diagnosis. At the time of PT eval, pt was able to demonstrate transfers and ambulation with up to mod assist and upright walker for support. Pt was educated on precautions, brace application/wearing schedule, appropriate activity progression, and car transfer. Pt currently with functional limitations due to the deficits listed below (see PT Problem List). Pt will benefit from skilled PT to increase their independence and safety with mobility to allow discharge to the venue listed below.         Recommendations for follow up therapy are one component of a multi-disciplinary discharge planning process, led by the attending physician.  Recommendations may be updated based on patient status, additional functional criteria and insurance authorization.  Follow Up Recommendations       Assistance Recommended at Discharge Frequent or constant Supervision/Assistance  Patient can return home with the following  A little help with walking and/or transfers;A little help with bathing/dressing/bathroom;Assistance with cooking/housework;Assist for transportation;Help with stairs or ramp for entrance    Equipment Recommendations None recommended by PT (TBD based on progress with PT)  Recommendations for Other Services       Functional Status Assessment Patient has had a recent decline in their functional status and demonstrates the ability to make significant improvements in function in a reasonable and predictable amount of time.     Precautions / Restrictions Precautions Precautions: Fall;Cervical Precaution Booklet Issued: Yes (comment) Precaution Comments: Reviewed handout and pt was cued for precautions during functional mobility. Required  Braces or Orthoses: Cervical Brace Cervical Brace: Soft collar;For comfort Restrictions Weight Bearing Restrictions: No      Mobility  Bed Mobility               General bed mobility comments: Pt was received sitting up in the recliner    Transfers Overall transfer level: Needs assistance Equipment used:  Carley Hammed walker) Transfers: Sit to/from Stand Sit to Stand: Mod assist           General transfer comment: Mod assist for power up to full stand. Increased time to achieve full upright position.    Ambulation/Gait Ambulation/Gait assistance: Min assist Gait Distance (Feet): 150 Feet Assistive device: Fara Boros Gait Pattern/deviations: Step-through pattern, Decreased stride length, Trunk flexed, Drifts right/left Gait velocity: Decreased Gait velocity interpretation: <1.31 ft/sec, indicative of household ambulator   General Gait Details: Assist for walker management. Pt was able to ambulate with improvement in posture compared to RW with OT.  Stairs            Wheelchair Mobility    Modified Rankin (Stroke Patients Only)       Balance Overall balance assessment: Needs assistance Sitting-balance support: No upper extremity supported, Feet supported Sitting balance-Leahy Scale: Fair     Standing balance support: Bilateral upper extremity supported, Reliant on assistive device for balance Standing balance-Leahy Scale: Poor                               Pertinent Vitals/Pain Pain Assessment Pain Assessment: Faces Faces Pain Scale: Hurts little more Pain Location: neck Pain Descriptors / Indicators: Operative site guarding, Sore, Tightness Pain Intervention(s): Limited activity within patient's tolerance, Monitored during session, Repositioned    Home Living Family/patient expects  to be discharged to:: Private residence Living Arrangements: Children Available Help at Discharge: Family;Available 24 hours/day (for 2 weeks) Type of Home:  House Home Access: Stairs to enter Entrance Stairs-Rails: Right;Left;Can reach both Entrance Stairs-Number of Steps: 3   Home Layout: Two level;Able to live on main level with bedroom/bathroom Home Equipment: Tub bench;Cane - quad;Rollator (4 wheels)      Prior Function Prior Level of Function : Independent/Modified Independent;Driving                     Hand Dominance   Dominant Hand: Right    Extremity/Trunk Assessment   Upper Extremity Assessment Upper Extremity Assessment: LUE deficits/detail LUE Deficits / Details: Decreaesed AROM at shoulder for flexion and abduction, also when placed cannot hold at 90 degrees flexion nor 90 degrees abduction. Elbow AROM 2+/5, rest 3/5. LUE Sensation: decreased light touch;decreased proprioception LUE Coordination: decreased gross motor    Lower Extremity Assessment Lower Extremity Assessment: LLE deficits/detail LLE Deficits / Details: Strength grossly equal R and L in quads, hamstrings, hip flexors, and ankle DF LLE Sensation: decreased light touch    Cervical / Trunk Assessment Cervical / Trunk Assessment: Kyphotic;Neck Surgery (forward head with right lateral flexion)  Communication   Communication: No difficulties  Cognition Arousal/Alertness: Awake/alert Behavior During Therapy: WFL for tasks assessed/performed Overall Cognitive Status: Within Functional Limits for tasks assessed                                          General Comments      Exercises     Assessment/Plan    PT Assessment Patient needs continued PT services  PT Problem List Decreased strength;Decreased activity tolerance;Decreased balance;Decreased mobility;Decreased knowledge of use of DME;Decreased safety awareness;Decreased knowledge of precautions;Pain;Impaired sensation       PT Treatment Interventions DME instruction;Gait training;Stair training;Functional mobility training;Therapeutic activities;Therapeutic  exercise;Patient/family education;Balance training;Neuromuscular re-education    PT Goals (Current goals can be found in the Care Plan section)  Acute Rehab PT Goals Patient Stated Goal: Back to PLOF PT Goal Formulation: With patient/family Time For Goal Achievement: 10/04/22 Potential to Achieve Goals: Good    Frequency Min 5X/week     Co-evaluation               AM-PAC PT "6 Clicks" Mobility  Outcome Measure Help needed turning from your back to your side while in a flat bed without using bedrails?: A Little Help needed moving from lying on your back to sitting on the side of a flat bed without using bedrails?: A Little Help needed moving to and from a bed to a chair (including a wheelchair)?: A Little Help needed standing up from a chair using your arms (e.g., wheelchair or bedside chair)?: A Lot Help needed to walk in hospital room?: A Little Help needed climbing 3-5 steps with a railing? : A Lot 6 Click Score: 16    End of Session Equipment Utilized During Treatment: Gait belt;Cervical collar Activity Tolerance: Patient tolerated treatment well Patient left: in chair;with call bell/phone within reach;with family/visitor present Nurse Communication: Mobility status PT Visit Diagnosis: Unsteadiness on feet (R26.81);Pain Pain - part of body:  (neck)    Time: 1610-9604 PT Time Calculation (min) (ACUTE ONLY): 25 min   Charges:   PT Evaluation $PT Eval Low Complexity: 1 Low PT Treatments $Gait Training: 8-22 mins  Conni Slipper, PT, DPT Acute Rehabilitation Services Secure Chat Preferred Office: [phone]   Marylynn Pearson 09/28/2022, 12:49 PM

## 2022-09-28 NOTE — Progress Notes (Signed)
Patient ID: David Smith, male   DOB: 04-15-1956, 67 y.o.   MRN: [id number] BP 105/63 (BP Location: Left Arm)   Pulse (!) 59   Temp 98.6 F (37 C) (Oral)   Resp 16   Ht  (1.854 m)   Wt 112 kg   SpO2 98%   BMI 32.59 kg/m  Alert, regaining coordinated movement in left upper extremity. Gait also improving Wound is clean, no sign of infection Needs more PT, and occupational therapy

## 2022-09-28 NOTE — TOC Initial Note (Signed)
Transition of Care [facility]) - Initial/Assessment Note    Patient Details  Name: David Smith MRN: [id number] Date of Birth: Oct 25, 1955  [facility]) CM/SW Contact:    Lockie Pares, RN Phone Number: 09/28/2022, 5:54 PM  Clinical Narrative:                 Spoke to patient over the phone about DC planning. Patient states he had [facility] for home health previously and would like them again. He says he may have a walker from a couple of years ago.  His daughter will check this evening.  Will need HH orders and to call agency rep in AM  Expected Discharge Plan: Home w Home Health Services Barriers to Discharge: Continued Medical Work up   Patient Goals and CMS Choice            Expected Discharge Plan and Services   Discharge Planning Services: CM Consult                               HH Arranged: PT, OT          Prior Living Arrangements/Services     Patient language and need for interpreter reviewed:: Yes Do you feel safe going back to the place where you live?: Yes      Need for Family Participation in Patient Care: Yes (Comment) Care giver support system in place?: Yes (comment)   Criminal Activity/Legal Involvement Pertinent to Current Situation/Hospitalization: No - Comment as needed  Activities of Daily Living      Permission Sought/Granted                  Emotional Assessment       Orientation: : Oriented to Self, Oriented to  Time, Oriented to Place, Oriented to Situation Alcohol / Substance Use: Not Applicable Psych Involvement: No (comment)  Admission diagnosis:  Cervical stenosis of spinal canal [M48.02] Patient Active Problem List   Diagnosis Date Noted   Cervical stenosis of spinal canal 09/27/2022   Malignant neoplasm of prostate metastatic to lymph nodes of multiple sites 08/18/2022   Cervical spondylosis with myelopathy and radiculopathy 01/05/2021   Atrial fibrillation with RVR (HCC) 03/02/2020   HOCM  (hypertrophic obstructive cardiomyopathy) (HCC) 03/02/2020   Spinal cord compression (HCC) 01/10/2016   Spinal stenosis in cervical region 10/05/2015   PCP NOTES >>>>>>>>>>>>>>>>>>>>>>>> 08/18/2015   Other malaise and fatigue 11/14/2013   Prostate cancer (HCC)    Annual physical exam 11/23/2011   Hip pain 09/29/2011   Hyperlipidemia 06/17/2007   HYPERTENSION, BENIGN ESSENTIAL 04/12/2007   PCP:  Wanda Plump, MD Pharmacy:   [facility] [address] ([place]), [place] - [address] [place]) [place] [address] Phone: [phone] Fax: [phone]     Social Determinants of Health (SDOH) Social History: SDOH Screenings   Food Insecurity: No Food Insecurity (08/15/2022)  Housing: Low Risk  (08/15/2022)  Transportation Needs: No Transportation Needs (08/15/2022)  Utilities: Not At Risk (08/15/2022)  Alcohol Screen: Low Risk  (08/15/2022)  Depression (PHQ2-9): High Risk (08/15/2022)  Financial Resource Strain: Low Risk  (02/17/2021)  Physical Activity: Sufficiently Active (02/17/2021)  Social Connections: Moderately Isolated (02/17/2021)  Stress: Stress Concern Present (02/17/2021)  Tobacco Use: Low Risk  (09/28/2022)   SDOH Interventions:     Readmission Risk Interventions     No data to display

## 2022-09-28 NOTE — Progress Notes (Signed)
Orthopedic Tech Progress Note Patient Details:  David Smith May 31, 1956 [id number]  Ortho Devices Type of Ortho Device: Soft collar Ortho Device/Splint Interventions: Ordered      [name] 09/28/2022, 12:35 PM

## 2022-09-28 NOTE — Anesthesia Postprocedure Evaluation (Signed)
Anesthesia Post Note  Patient: David Smith  Procedure(s) Performed: Posterior Cervical Laminectomy - Cervical Three - Cervical Four     Patient location during evaluation: PACU Anesthesia Type: General Level of consciousness: sedated and patient cooperative Pain management: pain level controlled Vital Signs Assessment: post-procedure vital signs reviewed and stable Respiratory status: spontaneous breathing Cardiovascular status: stable Anesthetic complications: no   There were no known notable events for this encounter.  Last Vitals:  Vitals:   09/27/22 2317 09/28/22 0336  BP: (!) 146/75 126/69  Pulse: 60 67  Resp: 18 18  Temp: 37 C 36.9 C  SpO2: 99% 98%    Last Pain:  Vitals:   09/28/22 0336  TempSrc: Oral  PainSc:                  [name]

## 2022-09-28 NOTE — Evaluation (Signed)
Occupational Therapy Evaluation Patient Details Name: David Smith MRN: [id number] DOB: August 10, 1955 Today's Date: 09/28/2022   History of Present Illness Pt is a 67 yo male s/p posterior Cervical Laminectomy 3-4   Clinical Impression   This 67 yo male admitted and underwent above presents to acute OT with PLOF of being Mod I/Independent with all basic ADLs and IADLs as well as driving. Currently the is at a setup/S-Max A level for basic ADLs at a RW level and min A level for all mobility. He will continue to benefit from acute OT with follow up HHOT.     Recommendations for follow up therapy are one component of a multi-disciplinary discharge planning process, led by the attending physician.  Recommendations may be updated based on patient status, additional functional criteria and insurance authorization.   Assistance Recommended at Discharge Frequent or constant Supervision/Assistance  Patient can return home with the following A little help with walking and/or transfers;A lot of help with bathing/dressing/bathroom;Assistance with cooking/housework;Help with stairs or ramp for entrance;Assist for transportation    Functional Status Assessment  Patient has had a recent decline in their functional status and demonstrates the ability to make significant improvements in function in a reasonable and predictable amount of time.  Equipment Recommendations  None recommended by OT       Precautions / Restrictions Precautions Precautions: Fall Required Braces or Orthoses: Cervical Brace Cervical Brace: Soft collar;For comfort Restrictions Weight Bearing Restrictions: No      Mobility Bed Mobility Overal bed mobility: Needs Assistance Bed Mobility: Rolling, Sidelying to Sit Rolling: Min assist Sidelying to sit: Min assist       General bed mobility comments: HOB flat no rail    Transfers Overall transfer level: Needs assistance Equipment used: Rolling walker (2  wheels) Transfers: Sit to/from Stand Sit to Stand: Min assist           General transfer comment: VCs for safe hand placement (have him push up with LUE and have his RUE on walker)      Balance Overall balance assessment: Needs assistance Sitting-balance support: No upper extremity supported, Feet supported Sitting balance-Leahy Scale: Fair     Standing balance support: Bilateral upper extremity supported, Reliant on assistive device for balance Standing balance-Leahy Scale: Poor                             ADL either performed or assessed with clinical judgement   ADL Overall ADL's : Needs assistance/impaired Eating/Feeding: Set up;Sitting   Grooming: Minimal assistance;Sitting   Upper Body Bathing: Minimal assistance;Sitting   Lower Body Bathing: Moderate assistance Lower Body Bathing Details (indicate cue type and reason): min A sit<>stand with increased time Upper Body Dressing : Moderate assistance;Sitting   Lower Body Dressing: Maximal assistance Lower Body Dressing Details (indicate cue type and reason): min A sit<>stand with increased time Toilet Transfer: Minimal assistance;Ambulation;Rolling walker (2 wheels) Toilet Transfer Details (indicate cue type and reason): simulated bed>door>around bed to recliner Toileting- Clothing Manipulation and Hygiene: Total assistance Toileting - Clothing Manipulation Details (indicate cue type and reason): min A sit<>stand with increased time             Vision Baseline Vision/History: 1 Wears glasses Patient Visual Report: No change from baseline              Pertinent Vitals/Pain Pain Assessment Pain Assessment: Faces Faces Pain Scale: Hurts a little bit Pain Location: neck Pain Descriptors /  Indicators:  (tight posteriorly) Pain Intervention(s): Limited activity within patient's tolerance, Monitored during session, Repositioned     Hand Dominance Right   Extremity/Trunk Assessment Upper  Extremity Assessment Upper Extremity Assessment: LUE deficits/detail LUE Deficits / Details: Decreaesed AROM at shoulder for flexion and abduction, also when placed cannot hold at 90 degrees flexion nor 90 degrees abduction. Elbow AROM 2+/5, rest 3/5. LUE Sensation: decreased light touch;decreased proprioception LUE Coordination: decreased gross motor       Cervical / Trunk Assessment Cervical / Trunk Assessment: Kyphotic;Neck Surgery (forward head with right lateral flexion)   Communication Communication Communication: No difficulties   Cognition Arousal/Alertness: Awake/alert Behavior During Therapy: WFL for tasks assessed/performed Overall Cognitive Status: Within Functional Limits for tasks assessed                                                  Home Living Family/patient expects to be discharged to:: Private residence Living Arrangements: Children Available Help at Discharge: Family;Available 24 hours/day (for 2 weeks) Type of Home: House Home Access: Stairs to enter [employer] of Steps: 3 Entrance Stairs-Rails: Right;Left;Can reach both Home Layout: Two level;Able to live on main level with bedroom/bathroom     Bathroom Shower/Tub: Chief Strategy Officer: Standard     Home Equipment: Tub bench;Cane - quad          Prior Functioning/Environment Prior Level of Function : Independent/Modified Independent;Driving                        OT Problem List: Decreased strength;Decreased range of motion;Impaired balance (sitting and/or standing);Pain;Impaired UE functional use      OT Treatment/Interventions: Self-care/ADL training;DME and/or AE instruction;Patient/family education;Balance training    OT Goals(Current goals can be found in the care plan section) Acute Rehab OT Goals Patient Stated Goal: for LUE to get back to normal OT Goal Formulation: With patient/family Time For Goal Achievement:  10/12/22 Potential to Achieve Goals: Good  OT Frequency: Min 2X/week       AM-PAC OT "6 Clicks" Daily Activity     Outcome Measure Help from another person eating meals?: A Little Help from another person taking care of personal grooming?: A Little Help from another person toileting, which includes using toliet, bedpan, or urinal?: A Lot Help from another person bathing (including washing, rinsing, drying)?: A Lot Help from another person to put on and taking off regular upper body clothing?: A Lot Help from another person to put on and taking off regular lower body clothing?: Total 6 Click Score: 13   End of Session Equipment Utilized During Treatment: Gait belt;Rolling walker (2 wheels) Nurse Communication:  (pt needs to stay another night)  Activity Tolerance: Patient tolerated treatment well Patient left: in chair;with call bell/phone within reach;with family/visitor present  OT Visit Diagnosis: Unsteadiness on feet (R26.81);Other abnormalities of gait and mobility (R26.89);Muscle weakness (generalized) (M62.81);Pain Pain - part of body:  (neck)                Time: 1324-4010 OT Time Calculation (min): 43 min Charges:  OT General Charges $OT Visit: 1 Visit OT Evaluation $OT Eval Moderate Complexity: 1 Mod OT Treatments $Self Care/Home Management : 23-37 mins  Lindon Romp OT Acute Rehabilitation Services Office [phone]    Evette Georges 09/28/2022, 10:25 AM

## 2022-09-29 DIAGNOSIS — M4802 Spinal stenosis, cervical region: Secondary | ICD-10-CM | POA: Diagnosis not present

## 2022-09-29 MED ORDER — DEXAMETHASONE 4 MG PO TABS
10.0000 mg | ORAL_TABLET | Freq: Once | ORAL | Status: AC
  Administered 2022-09-29: 10 mg via ORAL
  Filled 2022-09-29: qty 3

## 2022-09-29 NOTE — Progress Notes (Signed)
Occupational Therapy Treatment Patient Details Name: David Smith MRN: [id number] DOB: [birth year] Today's Date: 09/29/2022   History of present illness Pt is a 67 yo male s/p posterior Cervical Laminectomy C3-4 on 09/27/2022. PMH significant for HTN, PAF, prostate CA s/p prostatectomy, achilles tendon repair, ACDF 2022.   OT comments  This 67 yo male admitted with above seen today to discuss upright rollator and use of AE for LBD--which he practiced with, with the clothes he had in room, but due to decreased use of LUE since surgery he needs to try other types of clothing (his dtr will get for him today). We will continue to follow.   Recommendations for follow up therapy are one component of a multi-disciplinary discharge planning process, led by the attending physician.  Recommendations may be updated based on patient status, additional functional criteria and insurance authorization.    Assistance Recommended at Discharge Frequent or constant Supervision/Assistance  Patient can return home with the following  A little help with walking and/or transfers;A lot of help with bathing/dressing/bathroom;Assistance with cooking/housework;Help with stairs or ramp for entrance;Assist for transportation   Equipment Recommendations  None recommended by OT       Precautions / Restrictions Precautions Precautions: Fall;Cervical Precaution Booklet Issued: Yes (comment) Precaution Comments: Reviewed handout precautions Required Braces or Orthoses: Cervical Brace Cervical Brace: Soft collar;For comfort Restrictions Weight Bearing Restrictions: No       Mobility Bed Mobility               General bed mobility comments: Pt sitting in recliner upon my arrival    Transfers Overall transfer level: Needs assistance Equipment used: Rollator (4 wheels) (upright--raised the last notch that it could (and still seems a little too short for him)) Transfers: Sit to/from Stand Sit to Stand: Min  assist           General transfer comment: Assist for power up to full stand from recliner with both arms on handles of recliner to start     Balance Overall balance assessment: Mild deficits observed, not formally tested Sitting-balance support: No upper extremity supported, Feet supported Sitting balance-Leahy Scale: Fair     Standing balance support: Single extremity supported, Reliant on assistive device for balance Standing balance-Leahy Scale: Poor Standing balance comment: min A for balance when attempting to pull up his pants                           ADL either performed or assessed with clinical judgement   ADL Overall ADL's : Needs assistance/impaired                 Upper Body Dressing : Maximal assistance;Sitting Upper Body Dressing Details (indicate cue type and reason): button up shirt--was able to get LUE in arm of shirt with increased time, but then could not get shirt around his back and other arm in shirt nor button up shirt. Recommended to pt and dtr 1 size larger polo or v-neck t-shirt. Lower Body Dressing: Minimal assistance;With adaptive equipment Lower Body Dressing Details (indicate cue type and reason): min A sit<>stand with increased time; use of sock aid, reacher, and dressing stick. Recommended to patient and dtr that pt use elastic waist pants and slip on shoes that are a bit more loose or thinner socks.                    Extremity/Trunk Assessment Upper Extremity Assessment Upper Extremity Assessment:  LUE deficits/detail LUE Deficits / Details: Decreaesed AROM at shoulder for flexion and abduction (trace better today), also when placed cannot hold at 90 degrees flexion nor 90 degrees abduction. Elbow AROM 2+/5, rest 3/5. LUE Sensation: decreased light touch;decreased proprioception LUE Coordination: decreased gross motor      Vision Baseline Vision/History: 1 Wears glasses Patient Visual Report: No change from baseline             Cognition Arousal/Alertness: Awake/alert Behavior During Therapy: WFL for tasks assessed/performed Overall Cognitive Status: Within Functional Limits for tasks assessed                                          Exercises  LUE: lap slides; moving arm while in recliner from lap, arm of recliner, window sill and back slowly; hand to chin and back to arm rest of recliner slowly. Let him borrow our full length rolling mirror            Pertinent Vitals/ Pain       Pain Assessment Pain Assessment: No/denies pain  Home Living Family/patient expects to be discharged to:: Private residence Living Arrangements: Children Available Help at Discharge: Family;Available 24 hours/day (for 2 weeks) Type of Home: House Home Access: Stairs to enter [employer] of Steps: 3 Entrance Stairs-Rails: Right;Left;Can reach both Home Layout: Two level;Able to live on main level with bedroom/bathroom     Bathroom Shower/Tub: Chief Strategy Officer: Standard     Home Equipment: Tub bench;Cane Dance movement psychotherapist (4 wheels)              Frequency  Min 2X/week        Progress Toward Goals  OT Goals(current goals can now be found in the care plan section)  Progress towards OT goals: Progressing toward goals  Acute Rehab OT Goals Patient Stated Goal: for LUE to get back to normal OT Goal Formulation: With patient/family Time For Goal Achievement: 10/12/22 Potential to Achieve Goals: Good  Plan Discharge plan remains appropriate       AM-PAC OT "6 Clicks" Daily Activity     Outcome Measure   Help from another person eating meals?: A Little Help from another person taking care of personal grooming?: A Little Help from another person toileting, which includes using toliet, bedpan, or urinal?: A Lot Help from another person bathing (including washing, rinsing, drying)?: A Lot Help from another person to put on and taking off regular upper  body clothing?: A Lot Help from another person to put on and taking off regular lower body clothing?: A Lot 6 Click Score: 14    End of Session Equipment Utilized During Treatment: Rollator (4 wheels);Cervical collar (upright)  OT Visit Diagnosis: Unsteadiness on feet (R26.81);Other abnormalities of gait and mobility (R26.89);Muscle weakness (generalized) (M62.81)   Activity Tolerance Patient tolerated treatment well   Patient Left in chair;with call bell/phone within reach;with family/visitor present   Nurse Communication  (pt needs to stay one more night)        Time: 1308-6578 OT Time Calculation (min): 53 min  Charges: OT General Charges $OT Visit: 1 Visit OT Treatments $Self Care/Home Management : 53-67 mins  Lindon Romp OT Acute Rehabilitation Services Office [phone]    Evette Georges 09/29/2022, 10:07 AM

## 2022-09-29 NOTE — Progress Notes (Signed)
Physical Therapy Treatment Patient Details Name: David Smith MRN: [id number] DOB: 05-Oct-1955 Today's Date: 09/29/2022   History of Present Illness Pt is a 67 yo male s/p posterior Cervical Laminectomy C3-4 on 09/27/2022. PMH significant for HTN, PAF, prostate CA s/p prostatectomy, achilles tendon repair, ACDF 2022.    PT Comments    Pt progressing with post-op mobility. He was able to demonstrate transfers and ambulation with gross min assist and upright rollator for support. Platforms of rollator assisting with improved posture and management of walker due to L hand coordination/strength deficits. Noted overall cervical spinal alignment at rest is improved today with less R lateral deviation. Reinforced education on precautions, brace application/wearing schedule, appropriate activity progression, and car transfer. Will continue to follow.      Recommendations for follow up therapy are one component of a multi-disciplinary discharge planning process, led by the attending physician.  Recommendations may be updated based on patient status, additional functional criteria and insurance authorization.  Follow Up Recommendations       Assistance Recommended at Discharge Frequent or constant Supervision/Assistance  Patient can return home with the following A little help with walking and/or transfers;A little help with bathing/dressing/bathroom;Assistance with cooking/housework;Assist for transportation;Help with stairs or ramp for entrance   Equipment Recommendations  None recommended by PT (TBD based on progress with PT)    Recommendations for Other Services       Precautions / Restrictions Precautions Precautions: Fall;Cervical Precaution Booklet Issued: Yes (comment) Precaution Comments: Reviewed handout and pt was cued for precautions during functional mobility. Required Braces or Orthoses: Cervical Brace Cervical Brace: Soft collar;For comfort Restrictions Weight Bearing  Restrictions: No     Mobility  Bed Mobility Overal bed mobility: Needs Assistance Bed Mobility: Supine to Sit           General bed mobility comments: Pt was received with HOB significantly elevated in bed. He was able to scoot around to EOB while maintaining good posture.    Transfers Overall transfer level: Needs assistance Equipment used: Rollator (4 wheels) (Upright) Transfers: Sit to/from Stand Sit to Stand: Min assist, From elevated surface           General transfer comment: Assist for power up to full stand from elevated bed height.    Ambulation/Gait Ambulation/Gait assistance: Min assist Gait Distance (Feet): 175 Feet (x2) Assistive device: Rollator (4 wheels) (Upright) Gait Pattern/deviations: Step-through pattern, Decreased stride length, Trunk flexed, Drifts right/left Gait velocity: Decreased Gait velocity interpretation: <1.31 ft/sec, indicative of household ambulator   General Gait Details: Occasional assist for walker management. Upright rollator allowed for improved posture compared to RW. Pt motivated for distance and was able to complete a second walk after a few minute seated rest break. On second bout of gait training, posture significantly more flexed and knees appeared slightly flexed throughout.   Stairs             Wheelchair Mobility    Modified Rankin (Stroke Patients Only)       Balance Overall balance assessment: Needs assistance Sitting-balance support: No upper extremity supported, Feet supported Sitting balance-Leahy Scale: Fair     Standing balance support: Bilateral upper extremity supported, Reliant on assistive device for balance Standing balance-Leahy Scale: Poor                              Cognition Arousal/Alertness: Awake/alert Behavior During Therapy: WFL for tasks assessed/performed Overall Cognitive Status: Within Functional Limits for  tasks assessed                                           Exercises Other Exercises Other Exercises: gentle scapular retraction to neutral x10    General Comments        Pertinent Vitals/Pain Pain Assessment Pain Assessment: Faces Faces Pain Scale: Hurts a little bit Pain Location: neck Pain Descriptors / Indicators: Operative site guarding, Sore, Tightness Pain Intervention(s): Limited activity within patient's tolerance, Monitored during session, Repositioned    Home Living Family/patient expects to be discharged to:: Private residence Living Arrangements: Children Available Help at Discharge: Family;Available 24 hours/day (for 2 weeks) Type of Home: House Home Access: Stairs to enter Entrance Stairs-Rails: Right;Left;Can reach both Entrance Stairs-Number of Steps: 3   Home Layout: Two level;Able to live on main level with bedroom/bathroom Home Equipment: Tub bench;Cane - quad;Rollator (4 wheels)      Prior Function            PT Goals (current goals can now be found in the care plan section) Acute Rehab PT Goals Patient Stated Goal: Back to PLOF PT Goal Formulation: With patient/family Time For Goal Achievement: 10/04/22 Potential to Achieve Goals: Good Progress towards PT goals: Progressing toward goals    Frequency    Min 5X/week      PT Plan Current plan remains appropriate    Co-evaluation              AM-PAC PT "6 Clicks" Mobility   Outcome Measure  Help needed turning from your back to your side while in a flat bed without using bedrails?: A Little Help needed moving from lying on your back to sitting on the side of a flat bed without using bedrails?: A Little Help needed moving to and from a bed to a chair (including a wheelchair)?: A Little Help needed standing up from a chair using your arms (e.g., wheelchair or bedside chair)?: A Little Help needed to walk in hospital room?: A Little Help needed climbing 3-5 steps with a railing? : A Lot 6 Click Score: 17    End of Session  Equipment Utilized During Treatment: Gait belt;Cervical collar Activity Tolerance: Patient tolerated treatment well Patient left: in chair;with call bell/phone within reach;with family/visitor present Nurse Communication: Mobility status PT Visit Diagnosis: Unsteadiness on feet (R26.81);Pain Pain - part of body:  (neck)     Time: 7829-5621 PT Time Calculation (min) (ACUTE ONLY): 26 min  Charges:  $Gait Training: 23-37 mins                     Conni Slipper, PT, DPT Acute Rehabilitation Services Secure Chat Preferred Office: [phone]    Marylynn Pearson 09/29/2022, 9:46 AM

## 2022-09-29 NOTE — Progress Notes (Signed)
Patient ID: David Smith, male   DOB: [birth year], 67 y.o.   MRN: [id number] BP 111/72 (BP Location: Left Arm)   Pulse 75   Temp (!) 97.5 F (36.4 C) (Oral)   Resp 20   Ht 6\' 1"  (1.854 m)   Wt 112 kg   SpO2 99%   BMI 32.59 kg/m  Alert and oriented x 4, speech is clear and fluent Moving all extremities Walking on a walker, improving Possible discharge tomorrow

## 2022-09-29 NOTE — Progress Notes (Signed)
Occupational Therapy Treatment Patient Details Name: David Smith MRN: [id number] DOB: [birth year] Today's Date: 09/29/2022   History of present illness Pt is a 67 yo male s/p posterior Cervical Laminectomy C3-4 on 09/27/2022. PMH significant for HTN, PAF, prostate CA s/p prostatectomy, achilles tendon repair, ACDF 2022.   OT comments  Pt and dtr seen for a second session today for rolling full length mirror for patient to borrow while here to work on his UB and neck posture. Also to give them and discuss with them handouts on AE and upright rollators. We will continue to follow pt acutely with HHOT follow up recommended.   Recommendations for follow up therapy are one component of a multi-disciplinary discharge planning process, led by the attending physician.  Recommendations may be updated based on patient status, additional functional criteria and insurance authorization.    Assistance Recommended at Discharge Frequent or constant Supervision/Assistance  Patient can return home with the following  A little help with walking and/or transfers;A lot of help with bathing/dressing/bathroom;Assistance with cooking/housework;Help with stairs or ramp for entrance;Assist for transportation   Equipment Recommendations  None recommended by OT       Precautions / Restrictions Precautions Precautions: Fall;Cervical Precaution Booklet Issued: Yes (comment) Precaution Comments: Reviewed handout precautions Required Braces or Orthoses: Cervical Brace Cervical Brace: Soft collar;For comfort Restrictions Weight Bearing Restrictions: No              ADL either performed or assessed with clinical judgement   ADL                                         General ADL Comments: Took full upright rolling mirror to patient's room so he could use it for correcting his upper body and neck posture. Also provided pt and family with handout for AE he needed and for an upright rollator  that would work with his 6'1" height and not having him stooping.,     Vision Baseline Vision/History: 1 Wears glasses Patient Visual Report: No change from baseline            Cognition Arousal/Alertness: Awake/alert Behavior During Therapy: WFL for tasks assessed/performed Overall Cognitive Status: Within Functional Limits for tasks assessed                                                     Pertinent Vitals/ Pain       Pain Assessment Pain Assessment: No/denies pain         Frequency  Min 2X/week        Progress Toward Goals  OT Goals(current goals can now be found in the care plan section)  Progress towards OT goals: Progressing toward goals  Acute Rehab OT Goals OT Goal Formulation: With patient/family Time For Goal Achievement: 10/12/22 Potential to Achieve Goals: Good         AM-PAC OT "6 Clicks" Daily Activity     Outcome Measure   Help from another person eating meals?: A Little Help from another person taking care of personal grooming?: A Little Help from another person toileting, which includes using toliet, bedpan, or urinal?: A Lot Help from another person bathing (including washing, rinsing, drying)?: A Lot Help from another person to put on  and taking off regular upper body clothing?: A Lot Help from another person to put on and taking off regular lower body clothing?: A Little (with AE) 6 Click Score: 15    End of Session  Left in recliner, getting ready to work with PT for steps.      Activity Tolerance  Good              Time: 1610-9604 OT Time Calculation (min): 12 min  Charges: OT General Charges $OT Visit: 1 Visit OT Treatments $Self Care/Home Management : 8-22 mins  Lindon Romp OT Acute Rehabilitation Services Office [phone]    Evette Georges 09/29/2022, 4:38 PM

## 2022-09-29 NOTE — TOC Transition Note (Signed)
Transition of Care [facility]) - CM/SW Discharge Note   Patient Details  Name: David Smith MRN: [id number] Date of Birth: 16-Apr-1956  [facility] CM/SW Contact:  Lockie Pares, RN Phone Number: 09/29/2022, 8:01 AM   Clinical Narrative:    Plan to DC today. [facility] accepted for Home health. Patient daughter is making sure he has a walker at home, if not he may need one.      Final next level of care: Home w Home Health Services Barriers to Discharge: Continued Medical Work up   Patient Goals and CMS Choice      Discharge Placement    Home with home health                     Discharge Plan and Services Additional resources added to the After Visit Summary for     Discharge Planning Services: CM Consult                      HH Arranged: PT, OT [facility] Agency: [facility] Date [facility] Agency Contacted: 09/29/22 Time HH Agency Contacted: [phone] Representative spoke with at [facility] Agency: Kandee Keen  Social Determinants of Health (SDOH) Interventions SDOH Screenings   Food Insecurity: No Food Insecurity (08/15/2022)  Housing: Low Risk  (08/15/2022)  Transportation Needs: No Transportation Needs (08/15/2022)  Utilities: Not At Risk (08/15/2022)  Alcohol Screen: Low Risk  (08/15/2022)  Depression (PHQ2-9): High Risk (08/15/2022)  Financial Resource Strain: Low Risk  (02/17/2021)  Physical Activity: Sufficiently Active (02/17/2021)  Social Connections: Moderately Isolated (02/17/2021)  Stress: Stress Concern Present (02/17/2021)  Tobacco Use: Low Risk  (09/28/2022)     Readmission Risk Interventions     No data to display

## 2022-09-29 NOTE — Progress Notes (Signed)
Physical Therapy Treatment Patient Details Name: David Smith MRN: [id number] DOB: 1956-01-06 Today's Date: 09/29/2022   History of Present Illness Pt is a 67 yo male s/p posterior Cervical Laminectomy C3-4 on 09/27/2022. PMH significant for HTN, PAF, prostate CA s/p prostatectomy, achilles tendon repair, ACDF 2022.    PT Comments    Pt tolerates treatment well with session focused on stair training. At this time pt appears to ascend steps more effectively leading with LLE, utilizing railing and hand hold for stability. Pt's daughter provides appropriate cues for posture during session. Pt will benefit from further stair training in the morning to improve patient and caregiver confidence. Pt will benefit from frequent mobilization in an effort to improve strength and endurance.   Recommendations for follow up therapy are one component of a multi-disciplinary discharge planning process, led by the attending physician.  Recommendations may be updated based on patient status, additional functional criteria and insurance authorization.  Follow Up Recommendations       Assistance Recommended at Discharge Frequent or constant Supervision/Assistance  Patient can return home with the following A little help with walking and/or transfers;A little help with bathing/dressing/bathroom;Assistance with cooking/housework;Assist for transportation;Help with stairs or ramp for entrance   Equipment Recommendations  Other (comment) (OT provided information on upright walker if pt is interested in purchasing)    Recommendations for Other Services       Precautions / Restrictions Precautions Precautions: Fall;Cervical Precaution Booklet Issued: Yes (comment) Precaution Comments: Reviewed handout precautions Required Braces or Orthoses: Cervical Brace Cervical Brace: Soft collar;For comfort Restrictions Weight Bearing Restrictions: No     Mobility  Bed Mobility                     Transfers Overall transfer level: Needs assistance Equipment used: Rollator (4 wheels) (upright) Transfers: Sit to/from Stand Sit to Stand: Min assist           General transfer comment: cues for hand placement and trunk flexion    Ambulation/Gait Ambulation/Gait assistance: Min guard Gait Distance (Feet): 80 Feet Assistive device: Rollator (4 wheels) (upright) Gait Pattern/deviations: Step-through pattern, Trunk flexed Gait velocity: Decreased Gait velocity interpretation: <1.8 ft/sec, indicate of risk for recurrent falls   General Gait Details: cues for chest out, head up   Stairs Stairs: Yes Stairs assistance: Min assist Stair Management: One rail Right, Step to pattern, Forwards (pt descends sideways 1st attempt and backward 2nd attempt) Number of Stairs: 3 (2 on 2nd trial) General stair comments: pt appears to ascend step more effectively leading with LLE despite being the weaker leg. PT provides hand hold on L side with RUE on railing. Pt descends steps sideways initially and then backward for 2nd trial   Wheelchair Mobility    Modified Rankin (Stroke Patients Only)       Balance Overall balance assessment: Needs assistance Sitting-balance support: No upper extremity supported, Feet supported Sitting balance-Leahy Scale: Good     Standing balance support: Single extremity supported, Reliant on assistive device for balance Standing balance-Leahy Scale: Poor                              Cognition Arousal/Alertness: Awake/alert Behavior During Therapy: WFL for tasks assessed/performed Overall Cognitive Status: Within Functional Limits for tasks assessed  Exercises      General Comments General comments (skin integrity, edema, etc.): VSS on RA      Pertinent Vitals/Pain Pain Assessment Pain Assessment: Faces Faces Pain Scale: Hurts a little bit Pain Location: neck Pain  Descriptors / Indicators: Operative site guarding, Sore, Tightness Pain Intervention(s): Limited activity within patient's tolerance    Home Living                          Prior Function            PT Goals (current goals can now be found in the care plan section) Acute Rehab PT Goals Patient Stated Goal: Back to PLOF Progress towards PT goals: Progressing toward goals    Frequency    Min 5X/week      PT Plan Current plan remains appropriate    Co-evaluation              AM-PAC PT "6 Clicks" Mobility   Outcome Measure  Help needed turning from your back to your side while in a flat bed without using bedrails?: A Little Help needed moving from lying on your back to sitting on the side of a flat bed without using bedrails?: A Little Help needed moving to and from a bed to a chair (including a wheelchair)?: A Little Help needed standing up from a chair using your arms (e.g., wheelchair or bedside chair)?: A Little Help needed to walk in hospital room?: A Little Help needed climbing 3-5 steps with a railing? : A Little 6 Click Score: 18    End of Session Equipment Utilized During Treatment: Gait belt;Cervical collar Activity Tolerance: Patient tolerated treatment well Patient left: in chair;with call bell/phone within reach;with family/visitor present Nurse Communication: Mobility status PT Visit Diagnosis: Unsteadiness on feet (R26.81);Pain     Time: 1610-9604 PT Time Calculation (min) (ACUTE ONLY): 23 min  Charges:  $Gait Training: 23-37 mins                     Arlyss Gandy, PT, DPT Acute Rehabilitation Office [phone]    Arlyss Gandy 09/29/2022, 4:16 PM

## 2022-09-30 DIAGNOSIS — M4802 Spinal stenosis, cervical region: Secondary | ICD-10-CM | POA: Diagnosis not present

## 2022-09-30 MED ORDER — OXYCODONE HCL 10 MG PO TABS: 10.0000 mg | ORAL_TABLET | ORAL | 0 refills | Status: DC | PRN

## 2022-09-30 NOTE — TOC Transition Note (Signed)
Transition of Care [facility]) - CM/SW Discharge Note   Patient Details  Name: David Smith MRN: [id number] Date of Birth: [birth year]  Transition of Care [facility]) CM/SW Contact:  Lawerance Sabal, RN Phone Number: 09/30/2022, 9:28 AM   Clinical Narrative:     Nils Flack of DC, Unit staff will provide any DME needed for DC  Final next level of care: Home w Home Health Services Barriers to Discharge: Continued Medical Work up   Patient Goals and CMS Choice      Discharge Placement                         Discharge Plan and Services Additional resources added to the After Visit Summary for     Discharge Planning Services: CM Consult                      HH Arranged: PT, OT [facility] Agency: [facility] Date [facility] Agency Contacted: 09/29/22 Time HH Agency Contacted: [phone] Representative spoke with at [facility] Agency: Kandee Keen  Social Determinants of Health (SDOH) Interventions SDOH Screenings   Food Insecurity: No Food Insecurity (08/15/2022)  Housing: Low Risk  (08/15/2022)  Transportation Needs: No Transportation Needs (08/15/2022)  Utilities: Not At Risk (08/15/2022)  Alcohol Screen: Low Risk  (08/15/2022)  Depression (PHQ2-9): High Risk (08/15/2022)  Financial Resource Strain: Low Risk  (02/17/2021)  Physical Activity: Sufficiently Active (02/17/2021)  Social Connections: Moderately Isolated (02/17/2021)  Stress: Stress Concern Present (02/17/2021)  Tobacco Use: Low Risk  (09/28/2022)     Readmission Risk Interventions     No data to display

## 2022-09-30 NOTE — Plan of Care (Signed)
  Problem: Education: Goal: Ability to verbalize activity precautions or restrictions will improve Outcome: Completed/Met Goal: Knowledge of the prescribed therapeutic regimen will improve Outcome: Completed/Met Goal: Understanding of discharge needs will improve Outcome: Completed/Met   Problem: Activity: Goal: Ability to avoid complications of mobility impairment will improve Outcome: Completed/Met Goal: Ability to tolerate increased activity will improve Outcome: Completed/Met Goal: Will remain free from falls Outcome: Completed/Met   Problem: Bowel/Gastric: Goal: Gastrointestinal status for postoperative course will improve Outcome: Completed/Met   Problem: Clinical Measurements: Goal: Ability to maintain clinical measurements within normal limits will improve Outcome: Completed/Met Goal: Postoperative complications will be avoided or minimized Outcome: Completed/Met Goal: Diagnostic test results will improve Outcome: Completed/Met   Problem: Pain Management: Goal: Pain level will decrease Outcome: Completed/Met   Problem: Skin Integrity: Goal: Will show signs of wound healing Outcome: Completed/Met   Problem: Health Behavior/Discharge Planning: Goal: Identification of resources available to assist in meeting health care needs will improve Outcome: Completed/Met   Problem: Bladder/Genitourinary: Goal: Urinary functional status for postoperative course will improve Outcome: Completed/Met  Patient alert and oriented, ambulate, void, surgical site clean and dry, no sign of infection. D/c instructions explain and given to the patient all questions answered. Pt. D/c home per order.

## 2022-09-30 NOTE — Discharge Summary (Signed)
Discharge Summary   Date of Admission: 09/27/22   Date of Discharge: 09/30/22   Attending Physician:  [facility] Course: Patient was admitted following an uncomplicated C2-3, C3-4 laminectomy. They were recovered in PACU and transferred to [facility]. Their preop symptoms were improved. Pt to f/u in office in 2 weeks for post op visit. Pt is in agreement w/ plan.    Neurologic exam at discharge:  A&O x3 Strength 5/5 x4.  SILTx4.  Dressing c/d/I.    Discharge diagnosis: Cervical spondylosis with myelopathy    Patrici Ranks, [facility]

## 2022-09-30 NOTE — Progress Notes (Signed)
Physical Therapy Treatment Patient Details Name: David Smith MRN: [id number] DOB: 04/12/1956 Today's Date: 09/30/2022   History of Present Illness Pt is a 67 yo male s/p posterior Cervical Laminectomy C3-4 on 09/27/2022. PMH significant for HTN, PAF, prostate CA s/p prostatectomy, achilles tendon repair, ACDF 2022.    PT Comments    Pt progressing well. Focus this session was stair training. Both pt and his daughter report they feel comfortable with DC home today and no further mobility questions. I have encouraged the patient to gradually increase activity daily to tolerance.      Recommendations for follow up therapy are one component of a multi-disciplinary discharge planning process, led by the attending physician.  Recommendations may be updated based on patient status, additional functional criteria and insurance authorization.  Follow Up Recommendations       Assistance Recommended at Discharge Intermittent Supervision/Assistance  Patient can return home with the following A little help with walking and/or transfers;A little help with bathing/dressing/bathroom;Assistance with cooking/housework;Assist for transportation;Help with stairs or ramp for entrance   Equipment Recommendations  Other (comment) (pt has or will borrow all equipment needed)    Recommendations for Other Services       Precautions / Restrictions Precautions Precautions: Fall;Cervical Precaution Booklet Issued: Yes (comment) Precaution Comments: Reviewed handout precautions Required Braces or Orthoses: Cervical Brace Cervical Brace: Soft collar;For comfort Restrictions Weight Bearing Restrictions: No     Mobility  Bed Mobility Overal bed mobility:  (NT, up in chair)                  Transfers Overall transfer level: Needs assistance Equipment used: Rolling walker (2 wheels) Transfers: Sit to/from Stand Sit to Stand: Supervision           General transfer comment: VCs for safest  technique    Ambulation/Gait Ambulation/Gait assistance: Supervision Gait Distance (Feet): 40 Feet Assistive device: Rolling walker (2 wheels) Gait Pattern/deviations: Step-through pattern, Trunk flexed Gait velocity: Decreased         Stairs Stairs: Yes Stairs assistance: Min assist Stair Management: One rail Right, Step to pattern, Forwards Number of Stairs: 3 General stair comments: went up holding to right rail, forwards, left foot first. Came down sideways with 2 hands on rail, right foot down first. Practiced twice, the second time pt performed with daughter assisting.   Wheelchair Mobility    Modified Rankin (Stroke Patients Only)       Balance Overall balance assessment: Needs assistance                                          Cognition Arousal/Alertness: Awake/alert Behavior During Therapy: WFL for tasks assessed/performed Overall Cognitive Status: Within Functional Limits for tasks assessed                                          Exercises Other Exercises Other Exercises: Long arc quds in sitting and seated marching in place as pt/family asking for some home exercises    General Comments General comments (skin integrity, edema, etc.): daughter present for entire session      Pertinent Vitals/Pain Pain Assessment Pain Assessment:  (Did not assess. Pt did not mention pain)    Home Living  Prior Function            PT Goals (current goals can now be found in the care plan section) Acute Rehab PT Goals Patient Stated Goal: To play tennis Progress towards PT goals: Progressing toward goals    Frequency    Min 5X/week      PT Plan Current plan remains appropriate    Co-evaluation              AM-PAC PT "6 Clicks" Mobility   Outcome Measure  Help needed turning from your back to your side while in a flat bed without using bedrails?: A Little Help needed  moving from lying on your back to sitting on the side of a flat bed without using bedrails?: A Little Help needed moving to and from a bed to a chair (including a wheelchair)?: A Little Help needed standing up from a chair using your arms (e.g., wheelchair or bedside chair)?: A Little Help needed to walk in hospital room?: A Little Help needed climbing 3-5 steps with a railing? : A Little 6 Click Score: 18    End of Session Equipment Utilized During Treatment: Gait belt (Pt did not want to wear collar) Activity Tolerance: Patient tolerated treatment well Patient left: in bed;with family/visitor present Nurse Communication: Mobility status PT Visit Diagnosis: Unsteadiness on feet (R26.81)     Time: 1610-9604 PT Time Calculation (min) (ACUTE ONLY): 24 min  Charges:  $Gait Training: 23-37 mins                     Lavona Mound, PT   Acute Rehabilitation Services  Office [phone] 09/30/2022    Donnella Sham 09/30/2022, 9:42 AM

## 2022-09-30 NOTE — Progress Notes (Signed)
Occupational Therapy Treatment Patient Details Name: David Smith MRN: [id number] DOB: [birth year] Today's Date: 09/30/2022   History of present illness Pt is a 67 yo male s/p posterior Cervical Laminectomy C3-4 on 09/27/2022. PMH significant for HTN, PAF, prostate CA s/p prostatectomy, achilles tendon repair, ACDF 2022.   OT comments  Pt. Seen for skilled OT treatment session. Continuation of previous days session for use of a/e and modifications for ub/lb dressing.  Pt. Able to don full outfit including shoes and socks with good adherence to precautions.  Reviewed rom of LUE, fine motor activies, and provided theraputty and handout for use.  Dtr. Present for session and very active and helpful in pts. Care.  Pt. Progressing well.  Agree with current d/c recommendations.     Recommendations for follow up therapy are one component of a multi-disciplinary discharge planning process, led by the attending physician.  Recommendations may be updated based on patient status, additional functional criteria and insurance authorization.    Assistance Recommended at Discharge Frequent or constant Supervision/Assistance  Patient can return home with the following  A little help with walking and/or transfers;A lot of help with bathing/dressing/bathroom;Assistance with cooking/housework;Help with stairs or ramp for entrance;Assist for transportation   Equipment Recommendations  None recommended by OT    Recommendations for Other Services      Precautions / Restrictions Precautions Precautions: Fall;Cervical Precaution Comments: Reviewed handout precautions Cervical Brace: Soft collar;For comfort       Mobility Bed Mobility                    Transfers       Sit to Stand: Min assist           General transfer comment: sit/stand to pull pants up, pt. "plopped" down after pulling up pants. states he felt his balance was going out. reviewed tech. and safe way to sit from standing.   he verbalized understanding and states from previous PT and sx. he remembers he is supposed to feel both legs touching surfaces and reach to while sitting. also reviewed keeping one hand on the walker and pull up one side then switch vs. trying to pull up both sides as that limited his ue support and affected his balance.     Balance                                           ADL either performed or assessed with clinical judgement   ADL Overall ADL's : Needs assistance/impaired                 Upper Body Dressing : Min guard;Sitting;Cueing for compensatory techniques;Cueing for sequencing Upper Body Dressing Details (indicate cue type and reason): able to don v neck light sweater Lower Body Dressing: Min guard;Sitting/lateral leans;With adaptive equipment;Cueing for sequencing;Cueing for compensatory techniques Lower Body Dressing Details (indicate cue type and reason): doffed hospital socks, donned reg. socks and shoes. utilized shoe horn to doff socks and used sock aide to don, likes his own wooden shoe horn the best due to its durability               General ADL Comments: focus on ub/lb dressing with compensatory strategies and a/e.  also reviewed ue rom and fine motor activities    Extremity/Trunk Assessment  Vision       Perception     Praxis      Cognition Arousal/Alertness: Awake/alert Behavior During Therapy: WFL for tasks assessed/performed Overall Cognitive Status: Within Functional Limits for tasks assessed                                          Exercises Other Exercises Other Exercises: reviewed rom from previous session, pt. with good tech. and reports he has increased rom from previous day Other Exercises: provided examples of fine motor activies for pt., provided theraputty and handout for use as well Requires more assistance and cues for shoulders, head/neck position. Still guarded and nervous to  determine a normal "stretch" feeling vs. "Hurting" himself.  Encouraged cont. Use of mirror to guide him as well.  Dtr. Encouraging him and helping him with good tech. Also.      Shoulder Instructions       General Comments  Pt. Loves outdoor life and wants to get back to tennis, fishing, kayaking, and gardening.     Pertinent Vitals/ Pain       Pain Assessment Pain Assessment: No/denies pain  Home Living                                          Prior Functioning/Environment              Frequency  Min 2X/week        Progress Toward Goals  OT Goals(current goals can now be found in the care plan section)  Progress towards OT goals: Progressing toward goals     Plan Discharge plan remains appropriate    Co-evaluation                 AM-PAC OT "6 Clicks" Daily Activity     Outcome Measure   Help from another person eating meals?: A Little Help from another person taking care of personal grooming?: A Little Help from another person toileting, which includes using toliet, bedpan, or urinal?: A Lot Help from another person bathing (including washing, rinsing, drying)?: A Lot Help from another person to put on and taking off regular upper body clothing?: A Lot Help from another person to put on and taking off regular lower body clothing?: A Little 6 Click Score: 15    End of Session Equipment Utilized During Treatment: Rolling walker (2 wheels)  OT Visit Diagnosis: Unsteadiness on feet (R26.81);Other abnormalities of gait and mobility (R26.89);Muscle weakness (generalized) (M62.81)   Activity Tolerance Patient tolerated treatment well   Patient Left with family/visitor present;Other (comment) (seated eob)   Nurse Communication          Time: 1610-9604 OT Time Calculation (min): 34 min  Charges: OT General Charges $OT Visit: 1 Visit OT Treatments $Self Care/Home Management : 23-37 mins  Boneta Lucks, COTA/L Acute  Rehabilitation [phone]   Alessandra Bevels Lorraine-COTA/L 09/30/2022, 9:10 AM

## 2022-10-02 DIAGNOSIS — Z8546 Personal history of malignant neoplasm of prostate: Secondary | ICD-10-CM | POA: Diagnosis not present

## 2022-10-02 DIAGNOSIS — I10 Essential (primary) hypertension: Secondary | ICD-10-CM | POA: Diagnosis not present

## 2022-10-02 DIAGNOSIS — Z7901 Long term (current) use of anticoagulants: Secondary | ICD-10-CM | POA: Diagnosis not present

## 2022-10-02 DIAGNOSIS — M4712 Other spondylosis with myelopathy, cervical region: Secondary | ICD-10-CM | POA: Diagnosis not present

## 2022-10-02 DIAGNOSIS — Z4789 Encounter for other orthopedic aftercare: Secondary | ICD-10-CM | POA: Diagnosis not present

## 2022-10-02 DIAGNOSIS — Z9181 History of falling: Secondary | ICD-10-CM | POA: Diagnosis not present

## 2022-10-02 DIAGNOSIS — M4802 Spinal stenosis, cervical region: Secondary | ICD-10-CM | POA: Diagnosis not present

## 2022-10-03 DIAGNOSIS — M4712 Other spondylosis with myelopathy, cervical region: Secondary | ICD-10-CM | POA: Diagnosis not present

## 2022-10-03 DIAGNOSIS — Z8546 Personal history of malignant neoplasm of prostate: Secondary | ICD-10-CM | POA: Diagnosis not present

## 2022-10-03 DIAGNOSIS — Z7901 Long term (current) use of anticoagulants: Secondary | ICD-10-CM | POA: Diagnosis not present

## 2022-10-03 DIAGNOSIS — I10 Essential (primary) hypertension: Secondary | ICD-10-CM | POA: Diagnosis not present

## 2022-10-03 DIAGNOSIS — M4802 Spinal stenosis, cervical region: Secondary | ICD-10-CM | POA: Diagnosis not present

## 2022-10-03 DIAGNOSIS — Z4789 Encounter for other orthopedic aftercare: Secondary | ICD-10-CM | POA: Diagnosis not present

## 2022-10-03 DIAGNOSIS — Z9181 History of falling: Secondary | ICD-10-CM | POA: Diagnosis not present

## 2022-10-03 NOTE — Op Note (Signed)
09/27/2022  11:06 AM  PATIENT:  David Smith  66 y.o. male  PRE-OPERATIVE DIAGNOSIS:  Stenosis Cervical, Three, Four POST-OPERATIVE DIAGNOSIS:  Stenosis Cervical three, four PROCEDURE:  Procedure(s): Posterior Cervical Laminectomy - Cervical Three - Cervical Four  SURGEON: Surgeon(s): Coletta Memos, MD Bedelia Person, MD  ASSISTANTS:Thomas, Christiane Ha  ANESTHESIA:   general  EBL:  No intake/output data recorded.  BLOOD ADMINISTERED:none  CELL SAVER GIVEN:none  COUNT:per nursing  DRAINS: none   SPECIMEN:  No Specimen  DICTATION: David Smith was taken to the operating room, intubated, and placed under a general anesthetic without difficulty.  David Smith had a Mayfield head holder placed and was positioned prone with his head supported by the Mayfield and bed adapter. His posterior cervical region was prepped and draped in a sterile manner. I opened the incision with a 10 blade and exposed the C3, and C4 lamina. I performed laminectomies of three, and four using the drill, and Kerrison punches. I decompressed the spinal canal and spinal cord. I irrigated then started my closure.  I closed the incision by approximating the deep cervical fracture, subcutaneous planes and finally the subcuticular planes. I approximated the skin edges with a running nylon suture. I applied a sterile dressing. I removed the head holder from the adapter, David Smith was rolled onto the OR stretcher. I removed the head holder. David Smith was then extubated moving all extremities.   PLAN OF CARE: Admit for overnight observation  PATIENT DISPOSITION:  PACU - hemodynamically stable.   Delay start of Pharmacological VTE agent (>24hrs) due to surgical blood loss or risk of bleeding:  yes

## 2022-10-09 DIAGNOSIS — I10 Essential (primary) hypertension: Secondary | ICD-10-CM | POA: Diagnosis not present

## 2022-10-09 DIAGNOSIS — Z8546 Personal history of malignant neoplasm of prostate: Secondary | ICD-10-CM | POA: Diagnosis not present

## 2022-10-09 DIAGNOSIS — Z9181 History of falling: Secondary | ICD-10-CM | POA: Diagnosis not present

## 2022-10-09 DIAGNOSIS — M4712 Other spondylosis with myelopathy, cervical region: Secondary | ICD-10-CM | POA: Diagnosis not present

## 2022-10-09 DIAGNOSIS — M4802 Spinal stenosis, cervical region: Secondary | ICD-10-CM | POA: Diagnosis not present

## 2022-10-09 DIAGNOSIS — Z7901 Long term (current) use of anticoagulants: Secondary | ICD-10-CM | POA: Diagnosis not present

## 2022-10-09 DIAGNOSIS — Z4789 Encounter for other orthopedic aftercare: Secondary | ICD-10-CM | POA: Diagnosis not present

## 2022-10-11 DIAGNOSIS — Z7901 Long term (current) use of anticoagulants: Secondary | ICD-10-CM | POA: Diagnosis not present

## 2022-10-11 DIAGNOSIS — M4712 Other spondylosis with myelopathy, cervical region: Secondary | ICD-10-CM | POA: Diagnosis not present

## 2022-10-11 DIAGNOSIS — Z8546 Personal history of malignant neoplasm of prostate: Secondary | ICD-10-CM | POA: Diagnosis not present

## 2022-10-11 DIAGNOSIS — I10 Essential (primary) hypertension: Secondary | ICD-10-CM | POA: Diagnosis not present

## 2022-10-11 DIAGNOSIS — M4802 Spinal stenosis, cervical region: Secondary | ICD-10-CM | POA: Diagnosis not present

## 2022-10-11 DIAGNOSIS — Z4789 Encounter for other orthopedic aftercare: Secondary | ICD-10-CM | POA: Diagnosis not present

## 2022-10-11 DIAGNOSIS — Z9181 History of falling: Secondary | ICD-10-CM | POA: Diagnosis not present

## 2022-10-12 DIAGNOSIS — Z7901 Long term (current) use of anticoagulants: Secondary | ICD-10-CM | POA: Diagnosis not present

## 2022-10-12 DIAGNOSIS — I10 Essential (primary) hypertension: Secondary | ICD-10-CM | POA: Diagnosis not present

## 2022-10-12 DIAGNOSIS — Z8546 Personal history of malignant neoplasm of prostate: Secondary | ICD-10-CM | POA: Diagnosis not present

## 2022-10-12 DIAGNOSIS — M4802 Spinal stenosis, cervical region: Secondary | ICD-10-CM | POA: Diagnosis not present

## 2022-10-12 DIAGNOSIS — M4712 Other spondylosis with myelopathy, cervical region: Secondary | ICD-10-CM | POA: Diagnosis not present

## 2022-10-12 DIAGNOSIS — Z9181 History of falling: Secondary | ICD-10-CM | POA: Diagnosis not present

## 2022-10-12 DIAGNOSIS — Z4789 Encounter for other orthopedic aftercare: Secondary | ICD-10-CM | POA: Diagnosis not present

## 2022-10-16 DIAGNOSIS — M4712 Other spondylosis with myelopathy, cervical region: Secondary | ICD-10-CM | POA: Diagnosis not present

## 2022-10-16 DIAGNOSIS — M4802 Spinal stenosis, cervical region: Secondary | ICD-10-CM | POA: Diagnosis not present

## 2022-10-16 DIAGNOSIS — I10 Essential (primary) hypertension: Secondary | ICD-10-CM | POA: Diagnosis not present

## 2022-10-16 DIAGNOSIS — Z9181 History of falling: Secondary | ICD-10-CM | POA: Diagnosis not present

## 2022-10-16 DIAGNOSIS — Z4789 Encounter for other orthopedic aftercare: Secondary | ICD-10-CM | POA: Diagnosis not present

## 2022-10-16 DIAGNOSIS — Z8546 Personal history of malignant neoplasm of prostate: Secondary | ICD-10-CM | POA: Diagnosis not present

## 2022-10-16 DIAGNOSIS — Z7901 Long term (current) use of anticoagulants: Secondary | ICD-10-CM | POA: Diagnosis not present

## 2022-10-18 DIAGNOSIS — M4802 Spinal stenosis, cervical region: Secondary | ICD-10-CM | POA: Diagnosis not present

## 2022-10-18 DIAGNOSIS — Z7901 Long term (current) use of anticoagulants: Secondary | ICD-10-CM | POA: Diagnosis not present

## 2022-10-18 DIAGNOSIS — Z9181 History of falling: Secondary | ICD-10-CM | POA: Diagnosis not present

## 2022-10-18 DIAGNOSIS — M4712 Other spondylosis with myelopathy, cervical region: Secondary | ICD-10-CM | POA: Diagnosis not present

## 2022-10-18 DIAGNOSIS — Z8546 Personal history of malignant neoplasm of prostate: Secondary | ICD-10-CM | POA: Diagnosis not present

## 2022-10-18 DIAGNOSIS — Z4789 Encounter for other orthopedic aftercare: Secondary | ICD-10-CM | POA: Diagnosis not present

## 2022-10-18 DIAGNOSIS — I10 Essential (primary) hypertension: Secondary | ICD-10-CM | POA: Diagnosis not present

## 2022-10-19 DIAGNOSIS — M4802 Spinal stenosis, cervical region: Secondary | ICD-10-CM | POA: Diagnosis not present

## 2022-10-19 DIAGNOSIS — I10 Essential (primary) hypertension: Secondary | ICD-10-CM | POA: Diagnosis not present

## 2022-10-19 DIAGNOSIS — Z8546 Personal history of malignant neoplasm of prostate: Secondary | ICD-10-CM | POA: Diagnosis not present

## 2022-10-19 DIAGNOSIS — M4712 Other spondylosis with myelopathy, cervical region: Secondary | ICD-10-CM | POA: Diagnosis not present

## 2022-10-19 DIAGNOSIS — Z9181 History of falling: Secondary | ICD-10-CM | POA: Diagnosis not present

## 2022-10-19 DIAGNOSIS — Z7901 Long term (current) use of anticoagulants: Secondary | ICD-10-CM | POA: Diagnosis not present

## 2022-10-19 DIAGNOSIS — Z4789 Encounter for other orthopedic aftercare: Secondary | ICD-10-CM | POA: Diagnosis not present

## 2022-10-24 DIAGNOSIS — Z4789 Encounter for other orthopedic aftercare: Secondary | ICD-10-CM | POA: Diagnosis not present

## 2022-10-24 DIAGNOSIS — I10 Essential (primary) hypertension: Secondary | ICD-10-CM | POA: Diagnosis not present

## 2022-10-24 DIAGNOSIS — M4712 Other spondylosis with myelopathy, cervical region: Secondary | ICD-10-CM | POA: Diagnosis not present

## 2022-10-24 DIAGNOSIS — Z8546 Personal history of malignant neoplasm of prostate: Secondary | ICD-10-CM | POA: Diagnosis not present

## 2022-10-24 DIAGNOSIS — Z7901 Long term (current) use of anticoagulants: Secondary | ICD-10-CM | POA: Diagnosis not present

## 2022-10-24 DIAGNOSIS — Z9181 History of falling: Secondary | ICD-10-CM | POA: Diagnosis not present

## 2022-10-24 DIAGNOSIS — M4802 Spinal stenosis, cervical region: Secondary | ICD-10-CM | POA: Diagnosis not present

## 2022-10-25 DIAGNOSIS — M4802 Spinal stenosis, cervical region: Secondary | ICD-10-CM | POA: Diagnosis not present

## 2022-10-25 DIAGNOSIS — I10 Essential (primary) hypertension: Secondary | ICD-10-CM | POA: Diagnosis not present

## 2022-10-25 DIAGNOSIS — M4712 Other spondylosis with myelopathy, cervical region: Secondary | ICD-10-CM | POA: Diagnosis not present

## 2022-10-25 DIAGNOSIS — Z7901 Long term (current) use of anticoagulants: Secondary | ICD-10-CM | POA: Diagnosis not present

## 2022-10-25 DIAGNOSIS — Z8546 Personal history of malignant neoplasm of prostate: Secondary | ICD-10-CM | POA: Diagnosis not present

## 2022-10-25 DIAGNOSIS — Z4789 Encounter for other orthopedic aftercare: Secondary | ICD-10-CM | POA: Diagnosis not present

## 2022-10-25 DIAGNOSIS — Z9181 History of falling: Secondary | ICD-10-CM | POA: Diagnosis not present

## 2022-10-31 ENCOUNTER — Encounter: Payer: Self-pay | Admitting: Pharmacist

## 2022-10-31 DIAGNOSIS — Z9181 History of falling: Secondary | ICD-10-CM | POA: Diagnosis not present

## 2022-10-31 DIAGNOSIS — M4712 Other spondylosis with myelopathy, cervical region: Secondary | ICD-10-CM | POA: Diagnosis not present

## 2022-10-31 DIAGNOSIS — Z4789 Encounter for other orthopedic aftercare: Secondary | ICD-10-CM | POA: Diagnosis not present

## 2022-10-31 DIAGNOSIS — M4802 Spinal stenosis, cervical region: Secondary | ICD-10-CM | POA: Diagnosis not present

## 2022-10-31 DIAGNOSIS — I10 Essential (primary) hypertension: Secondary | ICD-10-CM | POA: Diagnosis not present

## 2022-10-31 DIAGNOSIS — Z8546 Personal history of malignant neoplasm of prostate: Secondary | ICD-10-CM | POA: Diagnosis not present

## 2022-10-31 DIAGNOSIS — Z7901 Long term (current) use of anticoagulants: Secondary | ICD-10-CM | POA: Diagnosis not present

## 2022-10-31 NOTE — Progress Notes (Signed)
Pharmacy Quality Measure Review  This patient is appearing on the insurance-provided list for being at risk of failing the adherence measure for cholesterol medications this calendar year.   Medication: rosuvastatin Last fill date per report was 07/2022 [facility] and verified patient filled 90 ds 09/26/2022 and was picked up.   Will follow adherence in 2024.

## 2022-11-01 DIAGNOSIS — Z7901 Long term (current) use of anticoagulants: Secondary | ICD-10-CM | POA: Diagnosis not present

## 2022-11-01 DIAGNOSIS — I10 Essential (primary) hypertension: Secondary | ICD-10-CM | POA: Diagnosis not present

## 2022-11-01 DIAGNOSIS — Z8546 Personal history of malignant neoplasm of prostate: Secondary | ICD-10-CM | POA: Diagnosis not present

## 2022-11-01 DIAGNOSIS — M4802 Spinal stenosis, cervical region: Secondary | ICD-10-CM | POA: Diagnosis not present

## 2022-11-01 DIAGNOSIS — Z4789 Encounter for other orthopedic aftercare: Secondary | ICD-10-CM | POA: Diagnosis not present

## 2022-11-01 DIAGNOSIS — M4712 Other spondylosis with myelopathy, cervical region: Secondary | ICD-10-CM | POA: Diagnosis not present

## 2022-11-01 DIAGNOSIS — Z9181 History of falling: Secondary | ICD-10-CM | POA: Diagnosis not present

## 2022-11-07 DIAGNOSIS — Z9181 History of falling: Secondary | ICD-10-CM | POA: Diagnosis not present

## 2022-11-07 DIAGNOSIS — M4802 Spinal stenosis, cervical region: Secondary | ICD-10-CM | POA: Diagnosis not present

## 2022-11-07 DIAGNOSIS — Z8546 Personal history of malignant neoplasm of prostate: Secondary | ICD-10-CM | POA: Diagnosis not present

## 2022-11-07 DIAGNOSIS — Z7901 Long term (current) use of anticoagulants: Secondary | ICD-10-CM | POA: Diagnosis not present

## 2022-11-07 DIAGNOSIS — M4712 Other spondylosis with myelopathy, cervical region: Secondary | ICD-10-CM | POA: Diagnosis not present

## 2022-11-07 DIAGNOSIS — I10 Essential (primary) hypertension: Secondary | ICD-10-CM | POA: Diagnosis not present

## 2022-11-07 DIAGNOSIS — Z4789 Encounter for other orthopedic aftercare: Secondary | ICD-10-CM | POA: Diagnosis not present

## 2022-11-13 DIAGNOSIS — I10 Essential (primary) hypertension: Secondary | ICD-10-CM | POA: Diagnosis not present

## 2022-11-13 DIAGNOSIS — M4712 Other spondylosis with myelopathy, cervical region: Secondary | ICD-10-CM | POA: Diagnosis not present

## 2022-11-13 DIAGNOSIS — Z9181 History of falling: Secondary | ICD-10-CM | POA: Diagnosis not present

## 2022-11-13 DIAGNOSIS — Z8546 Personal history of malignant neoplasm of prostate: Secondary | ICD-10-CM | POA: Diagnosis not present

## 2022-11-13 DIAGNOSIS — M4802 Spinal stenosis, cervical region: Secondary | ICD-10-CM | POA: Diagnosis not present

## 2022-11-13 DIAGNOSIS — Z4789 Encounter for other orthopedic aftercare: Secondary | ICD-10-CM | POA: Diagnosis not present

## 2022-11-13 DIAGNOSIS — Z7901 Long term (current) use of anticoagulants: Secondary | ICD-10-CM | POA: Diagnosis not present

## 2022-11-15 DIAGNOSIS — Z7901 Long term (current) use of anticoagulants: Secondary | ICD-10-CM | POA: Diagnosis not present

## 2022-11-15 DIAGNOSIS — Z9181 History of falling: Secondary | ICD-10-CM | POA: Diagnosis not present

## 2022-11-15 DIAGNOSIS — I10 Essential (primary) hypertension: Secondary | ICD-10-CM | POA: Diagnosis not present

## 2022-11-15 DIAGNOSIS — Z8546 Personal history of malignant neoplasm of prostate: Secondary | ICD-10-CM | POA: Diagnosis not present

## 2022-11-15 DIAGNOSIS — M4712 Other spondylosis with myelopathy, cervical region: Secondary | ICD-10-CM | POA: Diagnosis not present

## 2022-11-15 DIAGNOSIS — Z4789 Encounter for other orthopedic aftercare: Secondary | ICD-10-CM | POA: Diagnosis not present

## 2022-11-15 DIAGNOSIS — M4802 Spinal stenosis, cervical region: Secondary | ICD-10-CM | POA: Diagnosis not present

## 2022-11-24 ENCOUNTER — Telehealth: Payer: Self-pay | Admitting: Cardiovascular Disease

## 2022-11-24 ENCOUNTER — Other Ambulatory Visit: Payer: Self-pay | Admitting: Cardiovascular Disease

## 2022-11-24 DIAGNOSIS — I10 Essential (primary) hypertension: Secondary | ICD-10-CM | POA: Diagnosis not present

## 2022-11-24 DIAGNOSIS — Z4789 Encounter for other orthopedic aftercare: Secondary | ICD-10-CM | POA: Diagnosis not present

## 2022-11-24 DIAGNOSIS — M4802 Spinal stenosis, cervical region: Secondary | ICD-10-CM | POA: Diagnosis not present

## 2022-11-24 DIAGNOSIS — Z7901 Long term (current) use of anticoagulants: Secondary | ICD-10-CM | POA: Diagnosis not present

## 2022-11-24 DIAGNOSIS — Z8546 Personal history of malignant neoplasm of prostate: Secondary | ICD-10-CM | POA: Diagnosis not present

## 2022-11-24 DIAGNOSIS — Z9181 History of falling: Secondary | ICD-10-CM | POA: Diagnosis not present

## 2022-11-24 DIAGNOSIS — M4712 Other spondylosis with myelopathy, cervical region: Secondary | ICD-10-CM | POA: Diagnosis not present

## 2022-11-24 NOTE — Telephone Encounter (Signed)
Call to patient to get any readings or symptoms.  No answer.  LVM to call office

## 2022-11-24 NOTE — Telephone Encounter (Signed)
Pt c/o BP issue: STAT if pt c/o blurred vision, one-sided weakness or slurred speech  1. What are your last 5 BP readings? 180/90 at 3pm today, 170/90 by 3:50pm today  2. Are you having any other symptoms (ex. Dizziness, headache, blurred vision, passed out)? No  3. What is your BP issue? Jim from [facility] called to let office know pt most recent BP readings. He stated if any question contact him but if any recommendations contact the pt directly due to today being his last day with the pt. Please advise

## 2022-11-28 NOTE — Telephone Encounter (Signed)
LVM for patient to call tregarding any issues with BP

## 2022-11-29 NOTE — Telephone Encounter (Signed)
Third attempt .  LVM if any issues please give [place] a call.  Have not confirmed any issues just a PC from his PT person

## 2022-12-18 DIAGNOSIS — M4802 Spinal stenosis, cervical region: Secondary | ICD-10-CM | POA: Diagnosis not present

## 2022-12-18 DIAGNOSIS — Z6832 Body mass index (BMI) 32.0-32.9, adult: Secondary | ICD-10-CM | POA: Diagnosis not present

## 2022-12-20 DIAGNOSIS — C61 Malignant neoplasm of prostate: Secondary | ICD-10-CM | POA: Diagnosis not present

## 2023-02-15 NOTE — Progress Notes (Signed)
  Radiation Oncology         [phone] ________________________________  Name: David Smith MRN: [id number]  Date: 09/15/2022  DOB: 11-07-1955  End of Treatment Note  Diagnosis:   67 year old man with oligometastatic prostate cancer involving thoracic and pelvic lymph nodes with a current posttreatment PSA at 1.56      Indication for treatment:  Curative, Definitive UHRT       Radiation treatment dates:   09/04/22 - 09/15/22  Site/dose:   The involved lymph nodes in the chest and pelvis were  treated to 50 Gy in 10 fractions of 5 Gy with 30 Gy in 10 fractions to the nearby nodal echelon.  Beams/energy:   The patient was treated using stereotactic body radiotherapy according to a 3D conformal radiotherapy plan.  Volumetric arc fields were employed to deliver 6 MV X-rays.  Image guidance was performed with per fraction cone beam CT prior to treatment under personal MD supervision.  Immobilization was achieved using BodyFix Pillow.  Narrative: The patient tolerated radiation treatment relatively well with only mild fatigue.  Plan: The patient will receive a call in about one month from the radiation oncology department. He will continue follow up with Dr. Laverle Patter as well.  ________________________________  Artist Pais. Kathrynn Running, M.D.

## 2023-02-27 ENCOUNTER — Telehealth: Payer: Self-pay | Admitting: Internal Medicine

## 2023-02-27 NOTE — Telephone Encounter (Signed)
Copied from CRM [id number]. Topic: Medicare AWV >> Feb 27, 2023  3:19 PM Payton Doughty wrote: Reason for CRM: LM 02/27/2023 to schedule AWV   Verlee Rossetti; Care Guide Ambulatory Clinical Support [place] [facility] Direct Dial: [phone]

## 2023-03-14 ENCOUNTER — Other Ambulatory Visit: Payer: Self-pay | Admitting: Cardiovascular Disease

## 2023-03-21 DIAGNOSIS — C61 Malignant neoplasm of prostate: Secondary | ICD-10-CM | POA: Diagnosis not present

## 2023-03-21 LAB — PSA: PSA: 0.37

## 2023-03-28 DIAGNOSIS — C775 Secondary and unspecified malignant neoplasm of intrapelvic lymph nodes: Secondary | ICD-10-CM | POA: Diagnosis not present

## 2023-03-28 DIAGNOSIS — C61 Malignant neoplasm of prostate: Secondary | ICD-10-CM | POA: Diagnosis not present

## 2023-03-30 ENCOUNTER — Encounter: Payer: Self-pay | Admitting: Internal Medicine

## 2023-04-19 DIAGNOSIS — M4802 Spinal stenosis, cervical region: Secondary | ICD-10-CM | POA: Diagnosis not present

## 2023-04-29 ENCOUNTER — Other Ambulatory Visit: Payer: Self-pay | Admitting: Cardiovascular Disease

## 2023-04-30 NOTE — Telephone Encounter (Signed)
Prescription refill request for Eliquis received. Indication:afib Last office visit:1/24 Scr:1.18  4/24 Age: 67 Weight:112 kg  Prescription refilled

## 2023-06-18 ENCOUNTER — Ambulatory Visit (INDEPENDENT_AMBULATORY_CARE_PROVIDER_SITE_OTHER): Payer: Medicare HMO | Admitting: Internal Medicine

## 2023-06-18 ENCOUNTER — Encounter: Payer: Self-pay | Admitting: Internal Medicine

## 2023-06-18 ENCOUNTER — Ambulatory Visit (INDEPENDENT_AMBULATORY_CARE_PROVIDER_SITE_OTHER): Payer: Medicare HMO | Admitting: *Deleted

## 2023-06-18 VITALS — BP 134/66 | HR 64 | Ht 73.0 in | Wt 252.2 lb

## 2023-06-18 VITALS — BP 134/66 | HR 64 | Temp 98.1°F | Resp 18 | Ht 73.0 in | Wt 252.2 lb

## 2023-06-18 DIAGNOSIS — E785 Hyperlipidemia, unspecified: Secondary | ICD-10-CM | POA: Diagnosis not present

## 2023-06-18 DIAGNOSIS — Z0000 Encounter for general adult medical examination without abnormal findings: Secondary | ICD-10-CM | POA: Diagnosis not present

## 2023-06-18 DIAGNOSIS — Z79899 Other long term (current) drug therapy: Secondary | ICD-10-CM | POA: Diagnosis not present

## 2023-06-18 DIAGNOSIS — Z23 Encounter for immunization: Secondary | ICD-10-CM

## 2023-06-18 DIAGNOSIS — F419 Anxiety disorder, unspecified: Secondary | ICD-10-CM | POA: Diagnosis not present

## 2023-06-18 DIAGNOSIS — Z1211 Encounter for screening for malignant neoplasm of colon: Secondary | ICD-10-CM

## 2023-06-18 DIAGNOSIS — I10 Essential (primary) hypertension: Secondary | ICD-10-CM | POA: Diagnosis not present

## 2023-06-18 DIAGNOSIS — Z0001 Encounter for general adult medical examination with abnormal findings: Secondary | ICD-10-CM | POA: Diagnosis not present

## 2023-06-18 DIAGNOSIS — R739 Hyperglycemia, unspecified: Secondary | ICD-10-CM | POA: Diagnosis not present

## 2023-06-18 NOTE — Progress Notes (Signed)
 Subjective:   David Smith is a 68 y.o. male who presents for Medicare Annual/Subsequent preventive examination.  Visit Complete: In person   Cardiac Risk Factors include: advanced age (>77men, >3 women);obesity (BMI >30kg/m2);hypertension;dyslipidemia;male gender     Objective:    Today's Vitals   06/18/23 1258 06/18/23 1328  BP: (!) 157/59 134/66  Pulse: 66 64  SpO2: 98%   Weight: 252 lb 3.2 oz (114.4 kg)   Height: 6' 1 (1.854 m)    Body mass index is 33.27 kg/m.     06/18/2023    1:11 PM 09/27/2022    6:19 AM 09/18/2022   11:17 AM 08/15/2022   10:41 AM 02/23/2022    9:43 AM 02/17/2021    9:46 AM 01/05/2021    7:09 AM  Advanced Directives  Does Patient Have a Medical Advance Directive? Yes Yes Yes Yes Yes Yes Yes  Type of Estate Agent of [place];Living will Healthcare Power of [employer] Power of [employer] Power of [place];Living will Healthcare Power of [place];Living will Healthcare Power of [place];Living will Healthcare Power of Attorney  Does patient want to make changes to medical advance directive? No - Patient declined No - Patient declined   No - Patient declined  No - Patient declined  Copy of Healthcare Power of Attorney in Chart? Yes - validated most recent copy scanned in chart (See row information) Yes - validated most recent copy scanned in chart (See row information) Yes - validated most recent copy scanned in chart (See row information)  Yes - validated most recent copy scanned in chart (See row information) Yes - validated most recent copy scanned in chart (See row information)     Current Medications (verified) Outpatient Encounter Medications as of 06/18/2023  Medication Sig   ALPRAZolam  (XANAX ) 0.25 MG tablet Take 1-2 tablets (0.25-0.5 mg total) by mouth 2 (two) times daily as needed for anxiety.   apixaban  (ELIQUIS ) 5 MG TABS tablet Take 1 tablet (5 mg total) by mouth 2 (two) times daily.   diltiazem   (CARTIA  XT) 240 MG 24 hr capsule Take 1 capsule (240 mg total) by mouth daily. Pt needs to schedule appt with provider for further refills   meclizine  (ANTIVERT ) 25 MG tablet Take 1 tablet (25 mg total) by mouth 3 (three) times daily as needed for dizziness.   Multiple Vitamin (MULTIVITAMIN WITH MINERALS) TABS tablet Take 1 tablet by mouth daily.   oxyCODONE  10 MG TABS Take 1 tablet (10 mg total) by mouth every 4 (four) hours as needed for severe pain ((score 7 to 10)).   rosuvastatin  (CRESTOR ) 20 MG tablet Take 1 tablet by mouth once daily   tiZANidine  (ZANAFLEX ) 4 MG tablet Take 1 tablet (4 mg total) by mouth every 6 (six) hours as needed for muscle spasms.   No facility-administered encounter medications on file as of 06/18/2023.    Allergies (verified) Patient has no known allergies.   History: Past Medical History:  Diagnosis Date   Anemia    Post radiation, bloody urine when he drinks/eats acidic things   Chronic lower back pain 06/11/2012   daily a problem Pain level 7, Tylenol  helpful(down to level 2)   Depression    Due to med for his cancer - Lupron    Dysrhythmia    A. Fib   H/O cardiovascular stress test    non ischemic EF 55% 05-15-2007   Heart murmur    dx as a teen, Stress ECHO 2008 (-)  Hypertension    Hypertrophic obstructive cardiomyopathy (HOCM) (HCC)    PAF (paroxysmal atrial fibrillation) (HCC)    Prostate cancer metastatic to intrapelvic lymph node (HCC)    gleason 4+3=7, volume 56.4 cc, PSA 34.25   Past Surgical History:  Procedure Laterality Date   ACHILLES TENDON REPAIR     26 yrs ago, right foot   ANAL FISSURECTOMY  2008   ANTERIOR CERVICAL DECOMP/DISCECTOMY FUSION N/A 01/05/2021   Procedure: Cervical Three Corpectomy;  Surgeon: Gillie Duncans, MD;  Location: [facility] OR;  Service: Neurosurgery;  Laterality: N/A;   COLONOSCOPY W/ POLYPECTOMY  2017   dental procedure  06/11/2012   minor -office procedure to correct jaw issue-used anesthesia gas to do    EYE SURGERY Left 2004   piece of small metal removed from eye   LYMPHADENECTOMY  06/17/2012   Procedure: LYMPHADENECTOMY;  Surgeon: Noretta Ferrara, MD;  Location: [facility];  Service: Urology;  Laterality: Bilateral;   POSTERIOR CERVICAL LAMINECTOMY N/A 09/27/2022   Procedure: Posterior Cervical Laminectomy - Cervical Three - Cervical Four;  Surgeon: Gillie Duncans, MD;  Location: [facility] OR;  Service: Neurosurgery;  Laterality: N/A;   PROSTATE BIOPSY     ROBOT ASSISTED LAPAROSCOPIC RADICAL PROSTATECTOMY  06/17/2012   Procedure: ROBOTIC ASSISTED LAPAROSCOPIC RADICAL PROSTATECTOMY LEVEL 2;  Surgeon: Noretta Ferrara, MD;  Location: [facility];  Service: Urology;  Laterality: N/A;      Family History  Problem Relation Age of Onset   Cancer Mother        breast   Heart disease Mother    Cancer Father 35       prostate, removed   Cancer Brother 72       prostate, tx w/xrt   Colon cancer Neg Hx    Colon polyps Neg Hx    Social History   Socioeconomic History   Marital status: Widowed    Spouse name: Not on file   Number of children: 3   Years of education: Not on file   Highest education level: Not on file  Occupational History   Occupation: disability d/t back pain-not working at present  Tobacco Use   Smoking status: Never   Smokeless tobacco: Never  Vaping Use   Vaping status: Never Used  Substance and Sexual Activity   Alcohol use: Not Currently    Alcohol/week: 0.0 standard drinks of alcohol   Drug use: No   Sexual activity: Not Currently  Other Topics Concern   Not on file  Social History Narrative   Not on file   Social Drivers of Health   Financial Resource Strain: Low Risk  (06/18/2023)   Overall Financial Resource Strain (CARDIA)    Difficulty of Paying Living Expenses: Not very hard  Food Insecurity: No Food Insecurity (06/18/2023)   Hunger Vital Sign    Worried About Running Out of Food in the Last Year: Never true    Ran Out of Food in the Last Year: Never true  Transportation  Needs: No Transportation Needs (06/18/2023)   PRAPARE - Administrator, Civil Service (Medical): No    Lack of Transportation (Non-Medical): No  Physical Activity: Inactive (06/18/2023)   Exercise Vital Sign    Days of Exercise per Week: 0 days    Minutes of Exercise per Session: 0 min  Stress: No Stress Concern Present (06/18/2023)   [employer] of Occupational Health - Occupational Stress Questionnaire    Feeling of Stress : Only a little  Social Connections: Moderately Isolated (  06/18/2023)   Social Connection and Isolation Panel [NHANES]    Frequency of Communication with Friends and Family: More than three times a week    Frequency of Social Gatherings with Friends and Family: More than three times a week    Attends Religious Services: More than 4 times per year    Active Member of [employer] or Organizations: No    Attends Banker Meetings: Never    Marital Status: Widowed    Tobacco Counseling Counseling given: Not Answered   Clinical Intake:  Pre-visit preparation completed: Yes  Pain : No/denies pain  BMI - recorded: 33.27 Nutritional Status: BMI > 30  Obese Nutritional Risks: None Diabetes: No  How often do you need to have someone help you when you read instructions, pamphlets, or other written materials from your doctor or pharmacy?: 1 - Never  Interpreter Needed?: No  Information entered by :: Jeoffrey Lease, CMA   Activities of Daily Living    06/18/2023    1:04 PM 09/18/2022   11:20 AM  In your present state of health, do you have any difficulty performing the following activities:  Hearing? 0   Vision? 0   Difficulty concentrating or making decisions? 0   Walking or climbing stairs? 1   Dressing or bathing? 0   Doing errands, shopping? 0 0  Preparing Food and eating ? N   Using the Toilet? N   In the past six months, have you accidently leaked urine? Y   Do you have problems with loss of bowel control? N   Managing your  Medications? N   Managing your Finances? N   Housekeeping or managing your Housekeeping? N     Patient Care Team: [name], MD as PCP - General (Internal Medicine) O'Neal, Darryle Ned, MD as PCP - Cardiology (Internal Medicine) Renda Glance, MD as Consulting Physician (Urology) Skeet Juliene SAUNDERS, DO as Consulting Physician (Neurology) Vertell Pont, RN as Oncology Nurse Navigator  Indicate any recent Medical Services you may have received from other than [facility] providers in the past year (date may be approximate).     Assessment:   This is a routine wellness examination for David Smith.  Hearing/Vision screen No results found.   Goals Addressed   None    Depression Screen    06/18/2023    1:18 PM 08/15/2022   11:02 AM 02/23/2022    9:43 AM 02/17/2021    9:50 AM 12/17/2020    9:24 AM 08/17/2020   11:28 AM 05/07/2019    9:59 AM  PHQ 2/9 Scores  PHQ - 2 Score 6 6 6 1 4  0 0  PHQ- 9 Score 10 15 19  6       Fall Risk    06/18/2023    1:10 PM 02/23/2022    9:43 AM 02/17/2021    9:49 AM 12/17/2020    8:57 AM 05/07/2019   10:00 AM  Fall Risk   Falls in the past year? 0 0 0 0 0  Number falls in past yr: 0 0 0 0   Injury with Fall? 0 0 0 0   Risk for fall due to : Impaired balance/gait No Fall Risks     Follow up Falls evaluation completed Falls evaluation completed Falls prevention discussed Falls evaluation completed Falls evaluation completed    MEDICARE RISK AT HOME: Medicare Risk at Home Any stairs in or around the home?: Yes (going into the home) If so, are there any without handrails?: No  Home free of loose throw rugs in walkways, pet beds, electrical cords, etc?: Yes Adequate lighting in your home to reduce risk of falls?: Yes Life alert?: No Use of a cane, walker or w/c?: Yes Grab bars in the bathroom?: Yes Shower chair or bench in shower?: Yes Elevated toilet seat or a handicapped toilet?: Yes  TIMED UP AND GO:  Was the test performed?  Yes  Length of time to  ambulate 10 feet: 9 sec Gait slow and steady with assistive device    Cognitive Function:  134/66      06/18/2023    1:24 PM 02/23/2022    9:57 AM  6CIT Screen  What Year? 0 points 0 points  What month? 0 points 0 points  What time? 3 points 0 points  Count back from 20 0 points 0 points  Months in reverse 0 points 0 points  Repeat phrase 0 points 0 points  Total Score 3 points 0 points    Immunizations Immunization History  Administered Date(s) Administered   Influenza Split 05/17/2021   Influenza, High Dose Seasonal PF 06/04/2023   Influenza,inj,Quad PF,6+ Mos 06/28/2015   Influenza-Unspecified 03/05/2017, 04/05/2018, 04/06/2019, 03/26/2020   PFIZER(Purple Top)SARS-COV-2 Vaccination 08/21/2019, 09/11/2019, 03/26/2020, 09/29/2020   Pfizer Covid-19 Vaccine Bivalent Booster 68yrs & up 05/17/2021, 06/04/2023   Pneumococcal Polysaccharide-23 05/07/2019   Tdap 11/23/2011   Zoster Recombinant(Shingrix ) 06/29/2020    TDAP status: Due, Education has been provided regarding the importance of this vaccine. Advised may receive this vaccine at local pharmacy or Health Dept. Aware to provide a copy of the vaccination record if obtained from local pharmacy or Health Dept. Verbalized acceptance and understanding.  Flu Vaccine status: Up to date  Pneumococcal vaccine status: Due, Education has been provided regarding the importance of this vaccine. Advised may receive this vaccine at local pharmacy or Health Dept. Aware to provide a copy of the vaccination record if obtained from local pharmacy or Health Dept. Verbalized acceptance and understanding.  Covid-19 vaccine status: Information provided on how to obtain vaccines.   Qualifies for Shingles Vaccine? Yes   Zostavax completed No   Shingrix  Completed?: No.    Education has been provided regarding the importance of this vaccine. Patient has been advised to call insurance company to determine out of pocket expense if they have not yet  received this vaccine. Advised may also receive vaccine at local pharmacy or Health Dept. Verbalized acceptance and understanding.  Screening Tests Health Maintenance  Topic Date Due   HIV Screening  Never done   Pneumonia Vaccine 23+ Years old (2 of 2 - PCV) 05/06/2020   Colonoscopy  07/26/2020   Zoster Vaccines- Shingrix  (2 of 2) 08/24/2020   DTaP/Tdap/Td (2 - Td or Tdap) 11/22/2021   Medicare Annual Wellness (AWV)  02/24/2023   COVID-19 Vaccine (7 - 2024-25 season) 07/30/2023   INFLUENZA VACCINE  Completed   Hepatitis C Screening  Completed   HPV VACCINES  Aged Out    Health Maintenance  Health Maintenance Due  Topic Date Due   HIV Screening  Never done   Pneumonia Vaccine 71+ Years old (2 of 2 - PCV) 05/06/2020   Colonoscopy  07/26/2020   Zoster Vaccines- Shingrix  (2 of 2) 08/24/2020   DTaP/Tdap/Td (2 - Td or Tdap) 11/22/2021   Medicare Annual Wellness (AWV)  02/24/2023    Colorectal cancer screening: Referral to GI placed 06/18/23. Pt aware the office will call re: appt.  Lung Cancer Screening: (Low Dose CT Chest recommended if  Age 73-80 years, 20 pack-year currently smoking OR have quit w/in 15years.) does not qualify.   Additional Screening:  Hepatitis C Screening: does qualify; Completed 09/12/16  Vision Screening: Recommended annual ophthalmology exams for early detection of glaucoma and other disorders of the eye. Is the patient up to date with their annual eye exam?  Yes  Who is the provider or what is the name of the office in which the patient attends annual eye exams? [facility] If pt is not established with a provider, would they like to be referred to a provider to establish care? No .   Dental Screening: Recommended annual dental exams for proper oral hygiene  Diabetic Foot Exam: N/a  Community Resource Referral / Chronic Care Management: CRR required this visit?  No   CCM required this visit?  No     Plan:     I have personally reviewed and noted  the following in the patient's chart:   Medical and social history Use of alcohol, tobacco or illicit drugs  Current medications and supplements including opioid prescriptions. Patient is not currently taking opioid prescriptions. Functional ability and status Nutritional status Physical activity Advanced directives List of other physicians Hospitalizations, surgeries, and ER visits in previous 12 months Vitals Screenings to include cognitive, depression, and falls Referrals and appointments  In addition, I have reviewed and discussed with patient certain preventive protocols, quality metrics, and best practice recommendations. A written personalized care plan for preventive services as well as general preventive health recommendations were provided to patient.     Kandis Gauze, CMA   06/18/2023   After Visit Summary: (In Person-Declined) Patient declined AVS at this time.  Nurse Notes: None

## 2023-06-18 NOTE — Patient Instructions (Signed)
 David Smith , Thank you for taking time to come for your Medicare Wellness Visit. I appreciate your ongoing commitment to your health goals. Please review the following plan we discussed and let me know if I can assist you in the future.     This is a list of the screening recommended for you and due dates:  Health Maintenance  Topic Date Due   HIV Screening  Never done   Pneumonia Vaccine (2 of 2 - PCV) 05/06/2020   Colon Cancer Screening  07/26/2020   Zoster (Shingles) Vaccine (2 of 2) 08/24/2020   DTaP/Tdap/Td vaccine (2 - Td or Tdap) 11/22/2021   COVID-19 Vaccine (7 - 2024-25 season) 07/30/2023   Medicare Annual Wellness Visit  06/17/2024   Flu Shot  Completed   Hepatitis C Screening  Completed   HPV Vaccine  Aged Out    Next appointment: Follow up in one year for your annual wellness visit.   Preventive Care 66 Years and Older, Male Preventive care refers to lifestyle choices and visits with your health care provider that can promote health and wellness. What does preventive care include? A yearly physical exam. This is also called an annual well check. Dental exams once or twice a year. Routine eye exams. Ask your health care provider how often you should have your eyes checked. Personal lifestyle choices, including: Daily care of your teeth and gums. Regular physical activity. Eating a healthy diet. Avoiding tobacco and drug use. Limiting alcohol use. Practicing safe sex. Taking low doses of aspirin  every day. Taking vitamin and mineral supplements as recommended by your health care provider. What happens during an annual well check? The services and screenings done by your health care provider during your annual well check will depend on your age, overall health, lifestyle risk factors, and family history of disease. Counseling  Your health care provider may ask you questions about your: Alcohol use. Tobacco use. Drug use. Emotional well-being. Home and  relationship well-being. Sexual activity. Eating habits. History of falls. Memory and ability to understand (cognition). Work and work astronomer. Screening  You may have the following tests or measurements: Height, weight, and BMI. Blood pressure. Lipid and cholesterol levels. These may be checked every 5 years, or more frequently if you are over 52 years old. Skin check. Lung cancer screening. You may have this screening every year starting at age 17 if you have a 30-pack-year history of smoking and currently smoke or have quit within the past 15 years. Fecal occult blood test (FOBT) of the stool. You may have this test every year starting at age 65. Flexible sigmoidoscopy or colonoscopy. You may have a sigmoidoscopy every 5 years or a colonoscopy every 10 years starting at age 25. Prostate cancer screening. Recommendations will vary depending on your family history and other risks. Hepatitis C blood test. Hepatitis B blood test. Sexually transmitted disease (STD) testing. Diabetes screening. This is done by checking your blood sugar (glucose) after you have not eaten for a while (fasting). You may have this done every 1-3 years. Abdominal aortic aneurysm (AAA) screening. You may need this if you are a current or former smoker. Osteoporosis. You may be screened starting at age 29 if you are at high risk. Talk with your health care provider about your test results, treatment options, and if necessary, the need for more tests. Vaccines  Your health care provider may recommend certain vaccines, such as: Influenza vaccine. This is recommended every year. Tetanus, diphtheria, and acellular  pertussis (Tdap, Td) vaccine. You may need a Td booster every 10 years. Zoster vaccine. You may need this after age 33. Pneumococcal 13-valent conjugate (PCV13) vaccine. One dose is recommended after age 41. Pneumococcal polysaccharide (PPSV23) vaccine. One dose is recommended after age 15. Talk to your  health care provider about which screenings and vaccines you need and how often you need them. This information is not intended to replace advice given to you by your health care provider. Make sure you discuss any questions you have with your health care provider. Document Released: 06/18/2015 Document Revised: 02/09/2016 Document Reviewed: 03/23/2015 Elsevier Interactive Patient Education  2017 [employer].  Fall Prevention in the Home Falls can cause injuries. They can happen to people of all ages. There are many things you can do to make your home safe and to help prevent falls. What can I do on the outside of my home? Regularly fix the edges of walkways and driveways and fix any cracks. Remove anything that might make you trip as you walk through a door, such as a raised step or threshold. Trim any bushes or trees on the path to your home. Use bright outdoor lighting. Clear any walking paths of anything that might make someone trip, such as rocks or tools. Regularly check to see if handrails are loose or broken. Make sure that both sides of any steps have handrails. Any raised decks and porches should have guardrails on the edges. Have any leaves, snow, or ice cleared regularly. Use sand or salt on walking paths during winter. Clean up any spills in your garage right away. This includes oil or grease spills. What can I do in the bathroom? Use night lights. Install grab bars by the toilet and in the tub and shower. Do not use towel bars as grab bars. Use non-skid mats or decals in the tub or shower. If you need to sit down in the shower, use a plastic, non-slip stool. Keep the floor dry. Clean up any water that spills on the floor as soon as it happens. Remove soap buildup in the tub or shower regularly. Attach bath mats securely with double-sided non-slip rug tape. Do not have throw rugs and other things on the floor that can make you trip. What can I do in the bedroom? Use night  lights. Make sure that you have a light by your bed that is easy to reach. Do not use any sheets or blankets that are too big for your bed. They should not hang down onto the floor. Have a firm chair that has side arms. You can use this for support while you get dressed. Do not have throw rugs and other things on the floor that can make you trip. What can I do in the kitchen? Clean up any spills right away. Avoid walking on wet floors. Keep items that you use a lot in easy-to-reach places. If you need to reach something above you, use a strong step stool that has a grab bar. Keep electrical cords out of the way. Do not use floor polish or wax that makes floors slippery. If you must use wax, use non-skid floor wax. Do not have throw rugs and other things on the floor that can make you trip. What can I do with my stairs? Do not leave any items on the stairs. Make sure that there are handrails on both sides of the stairs and use them. Fix handrails that are broken or loose. Make sure that handrails  are as long as the stairways. Check any carpeting to make sure that it is firmly attached to the stairs. Fix any carpet that is loose or worn. Avoid having throw rugs at the top or bottom of the stairs. If you do have throw rugs, attach them to the floor with carpet tape. Make sure that you have a light switch at the top of the stairs and the bottom of the stairs. If you do not have them, ask someone to add them for you. What else can I do to help prevent falls? Wear shoes that: Do not have high heels. Have rubber bottoms. Are comfortable and fit you well. Are closed at the toe. Do not wear sandals. If you use a stepladder: Make sure that it is fully opened. Do not climb a closed stepladder. Make sure that both sides of the stepladder are locked into place. Ask someone to hold it for you, if possible. Clearly mark and make sure that you can see: Any grab bars or handrails. First and last  steps. Where the edge of each step is. Use tools that help you move around (mobility aids) if they are needed. These include: Canes. Walkers. Scooters. Crutches. Turn on the lights when you go into a dark area. Replace any light bulbs as soon as they burn out. Set up your furniture so you have a clear path. Avoid moving your furniture around. If any of your floors are uneven, fix them. If there are any pets around you, be aware of where they are. Review your medicines with your doctor. Some medicines can make you feel dizzy. This can increase your chance of falling. Ask your doctor what other things that you can do to help prevent falls. This information is not intended to replace advice given to you by your health care provider. Make sure you discuss any questions you have with your health care provider. Document Released: 03/18/2009 Document Revised: 10/28/2015 Document Reviewed: 06/26/2014 Elsevier Interactive Patient Education  2017 [employer].

## 2023-06-18 NOTE — Assessment & Plan Note (Signed)
 Here for CPX  Hyper glycemia: Check A1c HTN: BP looks very good, recommend to check at home, continue diltiazem . High cholesterol: On Crestor  20 mg, checking labs. Chronic dizziness: On Antivert , symptoms are chronic, recommend to see neurology if needed. A-fib, HOCM: Anticoagulated, had a episode of palpitations and increased blood pressure December 2024, to see cardiology soon.  On today's exam he seems to be in sinus rhythm. Cervical stenosis, status post laminectomy April 2024, still has some paresthesias, uses a cane. RTC 1 year

## 2023-06-18 NOTE — Patient Instructions (Addendum)
 Due for colonoscopy.  You can reach out to gastroenterology at: [phone]  Vaccines I recommend: Tdap (tetanus)   Check the  blood pressure regularly Blood pressure goal:  between 110/65 and  135/85. If it is consistently higher or lower, let me know     GO TO THE LAB : Get the blood work     Next visit with me in 1 year, sooner if needed.    Please schedule it at the front desk      Health Care Power of attorney ,  Living will (Advance care planning documents)  If you already have a living will or healthcare power of attorney, is recommended you bring the copy to be scanned in your chart.   The document will be available to all the doctors you see in the system.  Advance care planning is a process that supports adults in  understanding and sharing their preferences regarding future medical care.  The patient's preferences are recorded in documents called Advance Directives and the can be modified at any time while the patient is in full mental capacity.   If you don't have one, please consider create one.      More information at: [email or web address]

## 2023-06-18 NOTE — Assessment & Plan Note (Signed)
 Here for CPX Tdap: 2013 PNM 23: 2020 PNM 20: 06/2023 S/p Shingrix  x 2 per pt  Had a flu shot and a covid vax few weeks ago Rec Tdap @ the pharmacy CCS: scope 07-2015, +polyps, 5 years per GI letter , pt likes to proceed , refer to GI H/o prostate ca, per urology  Blood work today: CMP FLP CBC A1c [facility] POA: Information provided.

## 2023-06-18 NOTE — Progress Notes (Signed)
 Subjective:    Patient ID: David Smith, male    DOB: December 07, 1955, 68 y.o.   MRN: [id number]  DOS:  06/18/2023 Type of visit - description: CPX  Here for CPX, LOV 2022 Had a spinal surgery, still recuperating.  Continue with decreased balance, numbness at the left upper extremity. Has chronic dizziness, occasionally severe, typically last 1 hour, happens once a week.  No associated nausea vomiting or strokelike symptoms. History of atrial fibrillation, had an episode in December.   Review of Systems  Other than above, a 14 point review of systems is negative     Past Medical History:  Diagnosis Date   Anemia    Post radiation, bloody urine when he drinks/eats acidic things   Chronic lower back pain 06/11/2012   daily a problem Pain level 7, Tylenol  helpful(down to level 2)   Depression    Due to med for his cancer - Lupron    Dysrhythmia    A. Fib   H/O cardiovascular stress test    non ischemic EF 55% 05-15-2007   Heart murmur    dx as a teen, Stress ECHO 2008 (-)    Hypertension    Hypertrophic obstructive cardiomyopathy (HOCM) (HCC)    PAF (paroxysmal atrial fibrillation) (HCC)    Prostate cancer metastatic to intrapelvic lymph node (HCC)    gleason 4+3=7, volume 56.4 cc, PSA 34.25    Past Surgical History:  Procedure Laterality Date   ACHILLES TENDON REPAIR     26 yrs ago, right foot   ANAL FISSURECTOMY  2008   ANTERIOR CERVICAL DECOMP/DISCECTOMY FUSION N/A 01/05/2021   Procedure: Cervical Three Corpectomy;  Surgeon: Gillie Duncans, MD;  Location: [facility];  Service: Neurosurgery;  Laterality: N/A;   COLONOSCOPY W/ POLYPECTOMY  2017   dental procedure  06/11/2012   minor -office procedure to correct jaw issue-used anesthesia gas to do   EYE SURGERY Left 2004   piece of small metal removed from eye   LYMPHADENECTOMY  06/17/2012   Procedure: LYMPHADENECTOMY;  Surgeon: Noretta Ferrara, MD;  Location: [facility];  Service: Urology;  Laterality: Bilateral;   POSTERIOR  CERVICAL LAMINECTOMY N/A 09/27/2022   Procedure: Posterior Cervical Laminectomy - Cervical Three - Cervical Four;  Surgeon: Gillie Duncans, MD;  Location: [facility];  Service: Neurosurgery;  Laterality: N/A;   PROSTATE BIOPSY     ROBOT ASSISTED LAPAROSCOPIC RADICAL PROSTATECTOMY  06/17/2012   Procedure: ROBOTIC ASSISTED LAPAROSCOPIC RADICAL PROSTATECTOMY LEVEL 2;  Surgeon: Noretta Ferrara, MD;  Location: [facility];  Service: Urology;  Laterality: N/A;      Social History   Socioeconomic History   Marital status: Widowed    Spouse name: Not on file   Number of children: 3   Years of education: Not on file   Highest education level: Not on file  Occupational History   Occupation: disability d/t back pain-not working at present  Tobacco Use   Smoking status: Never   Smokeless tobacco: Never  Vaping Use   Vaping status: Never Used  Substance and Sexual Activity   Alcohol use: Not Currently    Alcohol/week: 0.0 standard drinks of alcohol   Drug use: No   Sexual activity: Not Currently  Other Topics Concern   Not on file  Social History Narrative   Household- pt and daughter   Social Drivers of Health   Financial Resource Strain: Low Risk  (06/18/2023)   Overall Financial Resource Strain (CARDIA)    Difficulty of Paying Living Expenses: Not  very hard  Food Insecurity: No Food Insecurity (06/18/2023)   Hunger Vital Sign    Worried About Running Out of Food in the Last Year: Never true    Ran Out of Food in the Last Year: Never true  Transportation Needs: No Transportation Needs (06/18/2023)   PRAPARE - Administrator, Civil Service (Medical): No    Lack of Transportation (Non-Medical): No  Physical Activity: Inactive (06/18/2023)   Exercise Vital Sign    Days of Exercise per Week: 0 days    Minutes of Exercise per Session: 0 min  Stress: No Stress Concern Present (06/18/2023)   [employer] of Occupational Health - Occupational Stress Questionnaire    Feeling of Stress : Only  a little  Social Connections: Moderately Isolated (06/18/2023)   Social Connection and Isolation Panel [NHANES]    Frequency of Communication with Friends and Family: More than three times a week    Frequency of Social Gatherings with Friends and Family: More than three times a week    Attends Religious Services: More than 4 times per year    Active Member of [employer] or Organizations: No    Attends Banker Meetings: Never    Marital Status: Widowed  Intimate Partner Violence: Not At Risk (06/18/2023)   Humiliation, Afraid, Rape, and Kick questionnaire    Fear of Current or Ex-Partner: No    Emotionally Abused: No    Physically Abused: No    Sexually Abused: No     Current Outpatient Medications  Medication Instructions   ALPRAZolam  (XANAX ) 0.25-0.5 mg, Oral, 2 times daily PRN   apixaban  (ELIQUIS ) 5 mg, Oral, 2 times daily   diltiazem  (CARTIA  XT) 240 mg, Oral, Daily, Pt needs to schedule appt with provider for further refills   meclizine  (ANTIVERT ) 25 mg, Oral, 3 times daily PRN   Multiple Vitamin (MULTIVITAMIN WITH MINERALS) TABS tablet 1 tablet, Daily   Oxycodone  HCl 10 mg, Oral, Every 4 hours PRN   rosuvastatin  (CRESTOR ) 20 mg, Oral, Daily   tiZANidine  (ZANAFLEX ) 4 mg, Oral, Every 6 hours PRN       Objective:   Physical Exam BP 134/66   Pulse 64   Temp 98.1 F (36.7 C) (Oral)   Resp 18   Ht 6' 1 (1.854 m)   Wt 252 lb 3 oz (114.4 kg)   SpO2 96%   BMI 33.27 kg/m  General: Well developed, NAD, BMI noted Neck: No  thyromegaly  HEENT:  Normocephalic . Face symmetric, atraumatic Lungs:  CTA B Normal respiratory effort, no intercostal retractions, no accessory muscle use. Heart: RRR,  no murmur.  Abdomen:  Not distended, soft, non-tender. No rebound or rigidity.   Lower extremities: no pretibial edema bilaterally  Skin: Exposed areas without rash. Not pale. Not jaundice Neurologic:  alert & oriented X3.  Speech normal, gait assisted by a cane.    Psych: Behavior appropriate. No anxious or depressed appearing.     Assessment    Problem list: No transfusions, Jehovah witness hyperglycemia: 01-2016 A1C >>> 6.0 HTN Dyslipidemia Insomnia-- no need for med as off 07/2017 Chronic back pain, offered surgery at some point, declined  CV: ---New onset A. fib (02-2020) ---New diagnosis HOCM (02-2020) --- Ascending Aorta dilatation GU: --Prostate cancer, Hormone sensitive.DX 2013, PSA 34.25, s/p prostatectomy, lymphadenectomy 2014. S/p XRT; + lymph nods mets , PSA increased, s/p lupron    --Hematuria- CT -cysto 2016 per urology-- likely d/t h/o XRT Mild anemia, normal iron and ferritin 06-2015  Syncope, dizziness  headaches:  -Saw neurology 4-17,w/u done, CTA head, neck, then neck MRI: dx w/ spinal compression per MRI.  Saw ENT 03-2021. -Posterior cervical laminectomy April 2024 Shingles, R face,2017    PLAN: Here for CPX Tdap: 2013 PNM 23: 2020 PNM 20: 06/2023 S/p Shingrix  x 2 per pt  Had a flu shot and a covid vax few weeks ago Rec Tdap @ the pharmacy CCS: scope 07-2015, +polyps, 5 years per GI letter , pt likes to proceed , refer to GI H/o prostate ca, per urology  Blood work today: CMP FLP CBC A1c [facility] POA: Information provided. Hyper glycemia: Check A1c HTN: BP looks very good, recommend to check at home, continue diltiazem . High cholesterol: On Crestor  20 mg, checking labs. Chronic dizziness: On Antivert , symptoms are chronic, recommend to see neurology if needed. A-fib, HOCM: Anticoagulated, had a episode of palpitations and increased blood pressure December 2024, to see cardiology soon.  On today's exam he seems to be in sinus rhythm. Cervical stenosis, status post laminectomy April 2024, still has some paresthesias, uses a cane. RTC 1 year

## 2023-06-19 LAB — LIPID PANEL
Cholesterol: 127 mg/dL (ref 0–200)
HDL: 44.4 mg/dL (ref >39.00)
LDL Cholesterol: 66 mg/dL (ref 0–99)
NonHDL: 82.31
Total CHOL/HDL Ratio: 3
Triglycerides: 84 mg/dL (ref 0.0–149.0)
VLDL: 16.8 mg/dL (ref 0.0–40.0)

## 2023-06-19 LAB — CBC WITH DIFFERENTIAL/PLATELET
Basophils Absolute: 0.1 10*3/uL (ref 0.0–0.1)
Basophils Relative: 1.2 % (ref 0.0–3.0)
Eosinophils Absolute: 0.1 10*3/uL (ref 0.0–0.7)
Eosinophils Relative: 3.1 % (ref 0.0–5.0)
HCT: 41.4 % (ref 39.0–52.0)
Hemoglobin: 13.2 g/dL (ref 13.0–17.0)
Lymphocytes Relative: 17.2 % (ref 12.0–46.0)
Lymphs Abs: 0.8 10*3/uL (ref 0.7–4.0)
MCHC: 31.9 g/dL (ref 30.0–36.0)
MCV: 84.9 fL (ref 78.0–100.0)
Monocytes Absolute: 0.3 10*3/uL (ref 0.1–1.0)
Monocytes Relative: 6.9 % (ref 3.0–12.0)
Neutro Abs: 3.4 10*3/uL (ref 1.4–7.7)
Neutrophils Relative %: 71.6 % (ref 43.0–77.0)
Platelets: 162 10*3/uL (ref 150.0–400.0)
RBC: 4.88 Mil/uL (ref 4.22–5.81)
RDW: 14.9 % (ref 11.5–15.5)
WBC: 4.7 10*3/uL (ref 4.0–10.5)

## 2023-06-19 LAB — COMPREHENSIVE METABOLIC PANEL
ALT: 25 U/L (ref 0–53)
AST: 19 U/L (ref 0–37)
Albumin: 4.7 g/dL (ref 3.5–5.2)
Alkaline Phosphatase: 93 U/L (ref 39–117)
BUN: 18 mg/dL (ref 6–23)
CO2: 29 meq/L (ref 19–32)
Calcium: 9.5 mg/dL (ref 8.4–10.5)
Chloride: 106 meq/L (ref 96–112)
Creatinine, Ser: 1.22 mg/dL (ref 0.40–1.50)
GFR: 62.24 mL/min (ref >60.00)
Glucose, Bld: 88 mg/dL (ref 70–99)
Potassium: 4.2 meq/L (ref 3.5–5.1)
Sodium: 143 meq/L (ref 135–145)
Total Bilirubin: 0.4 mg/dL (ref 0.2–1.2)
Total Protein: 7.1 g/dL (ref 6.0–8.3)

## 2023-06-19 LAB — HEMOGLOBIN A1C: Hgb A1c MFr Bld: 6.1 % (ref 4.6–6.5)

## 2023-06-19 LAB — TSH: TSH: 2.19 u[IU]/mL (ref 0.35–5.50)

## 2023-06-21 LAB — DRUG MONITORING PANEL 375977 , URINE

## 2023-06-21 LAB — DM TEMPLATE

## 2023-06-21 NOTE — Progress Notes (Signed)
Cardiology Office Note:  .   Date:  06/22/2023  ID:  David Smith, DOB [birth year], MRN [id number] PCP: Wanda Plump, MD  [facility] Providers Cardiologist:  Reatha Harps, MD { History of Present Illness: Marland Kitchen   David Smith is a 68 y.o. male with history of HOCM, pAF, ascending aortic aneurysm who presents for follow-up.    History of Present Illness   David Smith, a 68 year old male with a history of hypertrophic obstructive cardiomyopathy, paroxysmal atrial fibrillation, hypertension, and ascending aortic aneurysm, presents for a follow-up visit. Since the last visit, he underwent spinal surgery for stenosis, involving the removal of a bone and replacement with a titanium cage. Post-surgery, he experienced numbness on his left side and difficulty walking, which has been improving slowly. He has been engaging in physical therapy to 'wake the nerves' and regain sensation.  In addition to the post-surgical issues, he has been experiencing bowel problems, for which a colonoscopy is planned. He also reports chest discomfort, which radiates from the surgical site around the shoulders and up the neck. He has been managing episodes of vertigo with over-the-counter Meclizine, which he finds more effective than the prescribed dose. He also reports infrequent episodes of heart racing, which he has been documenting using a Cardi Mobile device. These episodes occur approximately every three months and can last for a couple of hours. He has also been dealing with the recent loss of his spouse, which has been a significant source of stress.   No CP or SOB.          Problem List 1. HOCM -23 mm septum (sigmoid septum variant) -LGE 2% -negative monitor for NSVT 07/2022 -no syncope -no family history of SCD -LVOTO 57 mmHG -> 10 mmHG with treatment -ETT 06/08/2020 -> Normal BP response to exercise  2. Atrial fibrillation, paroxysmal 3. HTN 4. Dilated ascending aorta -44 mm on CMR 02/2020 5.  Prostate CA -s/p prostatectomy/radiation -on anti-androgen therapy  -c/b radiation colitis/bladder damage 6. HLD -T chol 127, HDL 44, LDL 66, TG 84    ROS: All other ROS reviewed and negative. Pertinent positives noted in the HPI.     Studies Reviewed: Marland Kitchen   EKG Interpretation Date/Time:  Friday June 22 2023 14:33:44 EST Ventricular Rate:  67 PR Interval:  178 QRS Duration:  102 QT Interval:  410 QTC Calculation: 433 R Axis:   -55  Text Interpretation: Normal sinus rhythm Incomplete right bundle branch block Left anterior fascicular block Left ventricular hypertrophy with repolarization abnormality ( R in aVL , Cornell product , Romhilt-Estes ) Confirmed by Lennie Odor [phone]) on 06/22/2023 2:46:53 PM    TTE 07/07/2022  1. Severe hypertrophy of basal septum; mild LVOT gradient at rest 1.4 m/s  increasing to 1.8 m/s with valsalva (findings c/w HCM); moderately dilated  ascending aorta (4.6 cm).   2. Left ventricular ejection fraction, by estimation, is 60 to 65%. The  left ventricle has normal function. The left ventricle has no regional  wall motion abnormalities. There is severe left ventricular hypertrophy of  the basal-septal segment. Left  ventricular diastolic parameters are consistent with Grade I diastolic  dysfunction (impaired relaxation).   3. Right ventricular systolic function is normal. The right ventricular  size is normal. There is normal pulmonary artery systolic pressure.   4. Left atrial size was moderately dilated.   5. The mitral valve is normal in structure. Mild mitral valve  regurgitation. No evidence of mitral stenosis.  6. The aortic valve is tricuspid. Aortic valve regurgitation is mild to  moderate. No aortic stenosis is present.   7. Aortic dilatation noted. There is borderline dilatation of the aortic  root, measuring 39 mm. There is moderate dilatation of the ascending  aorta, measuring 46 mm.   8. The inferior vena cava is normal in size with  greater than 50%  respiratory variability, suggesting right atrial pressure of 3 mmHg.  Physical Exam:   VS:  BP 132/70 (BP Location: Left Arm, Patient Position: Sitting, Cuff Size: Large)   Pulse 67   Ht 6\' 1"  (1.854 m)   Wt 252 lb (114.3 kg)   BMI 33.25 kg/m    Wt Readings from Last 3 Encounters:  06/22/23 252 lb (114.3 kg)  06/18/23 252 lb 3 oz (114.4 kg)  06/18/23 252 lb 3.2 oz (114.4 kg)    GEN: Well nourished, well developed in no acute distress NECK: No JVD; No carotid bruits CARDIAC: RRR, 2/6 SEM, rubs, gallops RESPIRATORY:  Clear to auscultation without rales, wheezing or rhonchi  ABDOMEN: Soft, non-tender, non-distended EXTREMITIES:  No edema; No deformity  ASSESSMENT AND PLAN: .   Assessment and Plan    Hypertrophic Obstructive Cardiomyopathy Stable with improvement in LVOT gradient from 57 to 10 mmHg. No syncope or family history of sudden death. Low risk for sudden cardiac death. -Low risk monitor. Low risk CMR. Low risk stress test.  -plan to repeat monitor and echo next year  -Continue current management and monitor for changes.  Paroxysmal Atrial Fibrillation Infrequent episodes of rapid heartbeat lasting several hours. Currently managed with Diltiazem 240mg  ER daily and Eliquis 5mg  BID. -Continue current medications. -Permitted to take an extra Diltiazem 240mg  ER during episodes of rapid heartbeat. -Monitor for increased frequency of episodes.  Ascending Aortic Aneurysm Stable with measurements of 44mm on CMR and 46mm on echo last year. -Order CTA chest to monitor for changes.  Hyperlipidemia Well controlled with Crestor 20mg  daily. Most recent LDL 66. -Continue Crestor 20mg  daily  Vertigo Recurrent episodes, potentially related to recent spinal surgery. Currently managed with over-the-counter Meclizine 25mg . -Continue Meclizine as needed for vertigo episodes.              Follow-up: Return in about 1 year (around 06/21/2024).  Signed, Lenna Gilford.  Flora Lipps, MD, [facility]  [facility]  [address] [place], [place] [address] [phone]  3:20 PM

## 2023-06-22 ENCOUNTER — Ambulatory Visit: Payer: Medicare HMO | Attending: Cardiovascular Disease | Admitting: Cardiovascular Disease

## 2023-06-22 ENCOUNTER — Encounter: Payer: Self-pay | Admitting: Cardiovascular Disease

## 2023-06-22 VITALS — BP 132/70 | HR 67 | Ht 73.0 in | Wt 252.0 lb

## 2023-06-22 DIAGNOSIS — I421 Obstructive hypertrophic cardiomyopathy: Secondary | ICD-10-CM | POA: Diagnosis not present

## 2023-06-22 DIAGNOSIS — I7121 Aneurysm of the ascending aorta, without rupture: Secondary | ICD-10-CM

## 2023-06-22 DIAGNOSIS — R42 Dizziness and giddiness: Secondary | ICD-10-CM | POA: Diagnosis not present

## 2023-06-22 DIAGNOSIS — I480 Paroxysmal atrial fibrillation: Secondary | ICD-10-CM | POA: Diagnosis not present

## 2023-06-22 DIAGNOSIS — I77810 Thoracic aortic ectasia: Secondary | ICD-10-CM

## 2023-06-22 DIAGNOSIS — I10 Essential (primary) hypertension: Secondary | ICD-10-CM | POA: Diagnosis not present

## 2023-06-22 MED ORDER — ROSUVASTATIN CALCIUM 20 MG PO TABS: 20.0000 mg | ORAL_TABLET | Freq: Every day | ORAL | 3 refills | Status: AC

## 2023-06-22 MED ORDER — MECLIZINE HCL 25 MG PO TABS: 25.0000 mg | ORAL_TABLET | Freq: Three times a day (TID) | ORAL | 0 refills | Status: DC | PRN

## 2023-06-22 MED ORDER — APIXABAN 5 MG PO TABS: 5.0000 mg | ORAL_TABLET | Freq: Two times a day (BID) | ORAL | 3 refills | Status: AC

## 2023-06-22 MED ORDER — DILTIAZEM HCL ER COATED BEADS 240 MG PO CP24: 240.0000 mg | ORAL_CAPSULE | Freq: Every day | ORAL | 3 refills | Status: AC

## 2023-06-22 NOTE — Patient Instructions (Addendum)
Medication Instructions:  Your physician recommends that you continue on your current medications as directed. Please refer to the Current Medication list given to you today. MAY TAKE extra dose (capsule) of Diltiazem 240 mg for fast heart rate   *If you need a refill on your cardiac medications before your next appointment, please call your pharmacy*   Testing/Procedures: Non-Cardiac CT Angiography (CTA), is a special type of CT scan that uses a computer to produce multi-dimensional views of major blood vessels throughout the body, chest and aorta. In CT angiography, a contrast material is injected through an IV to help visualize the blood vessels     Follow-Up: At [facility], you and your health needs are our priority.  As part of our continuing mission to provide you with exceptional heart care, we have created designated Provider Care Teams.  These Care Teams include your primary Cardiologist (physician) and Advanced Practice Providers (APPs -  Physician Assistants and Nurse Practitioners) who all work together to provide you with the care you need, when you need it.  We recommend signing up for the patient portal called "MyChart".  Sign up information is provided on this After Visit Summary.  MyChart is used to connect with patients for Virtual Visits (Telemedicine).  Patients are able to view lab/test results, encounter notes, upcoming appointments, etc.  Non-urgent messages can be sent to your provider as well.   To learn more about what you can do with MyChart, go to [email or web address].    Your next appointment:   1 year(s)  Provider:   Reatha Harps, MD

## 2023-10-07 ENCOUNTER — Other Ambulatory Visit: Payer: Self-pay | Admitting: Cardiovascular Disease

## 2023-12-12 LAB — PSA: PSA: 0.57

## 2023-12-19 DIAGNOSIS — C61 Malignant neoplasm of prostate: Secondary | ICD-10-CM | POA: Diagnosis not present

## 2023-12-19 DIAGNOSIS — R1084 Generalized abdominal pain: Secondary | ICD-10-CM | POA: Diagnosis not present

## 2023-12-19 DIAGNOSIS — C775 Secondary and unspecified malignant neoplasm of intrapelvic lymph nodes: Secondary | ICD-10-CM | POA: Diagnosis not present

## 2023-12-20 ENCOUNTER — Encounter: Payer: Self-pay | Admitting: Internal Medicine

## 2023-12-24 ENCOUNTER — Ambulatory Visit (HOSPITAL_BASED_OUTPATIENT_CLINIC_OR_DEPARTMENT_OTHER)
Admission: RE | Admit: 2023-12-24 | Discharge: 2023-12-24 | Disposition: A | Discharge status: Home / Self Care | Source: Ambulatory Visit | Attending: Internal Medicine | Admitting: Internal Medicine

## 2023-12-24 ENCOUNTER — Encounter: Payer: Self-pay | Admitting: Internal Medicine

## 2023-12-24 ENCOUNTER — Ambulatory Visit (INDEPENDENT_AMBULATORY_CARE_PROVIDER_SITE_OTHER): Admitting: Internal Medicine

## 2023-12-24 ENCOUNTER — Ambulatory Visit: Payer: Self-pay | Admitting: Internal Medicine

## 2023-12-24 VITALS — BP 144/78 | HR 62 | Temp 98.4°F | Resp 16 | Ht 73.0 in | Wt 246.5 lb

## 2023-12-24 DIAGNOSIS — R1310 Dysphagia, unspecified: Secondary | ICD-10-CM

## 2023-12-24 DIAGNOSIS — I10 Essential (primary) hypertension: Secondary | ICD-10-CM

## 2023-12-24 DIAGNOSIS — R1031 Right lower quadrant pain: Secondary | ICD-10-CM

## 2023-12-24 DIAGNOSIS — K5900 Constipation, unspecified: Secondary | ICD-10-CM

## 2023-12-24 DIAGNOSIS — C61 Malignant neoplasm of prostate: Secondary | ICD-10-CM | POA: Diagnosis not present

## 2023-12-24 DIAGNOSIS — I517 Cardiomegaly: Secondary | ICD-10-CM | POA: Diagnosis not present

## 2023-12-24 DIAGNOSIS — C799 Secondary malignant neoplasm of unspecified site: Secondary | ICD-10-CM | POA: Diagnosis not present

## 2023-12-24 LAB — COMPREHENSIVE METABOLIC PANEL WITH GFR
ALT: 16 U/L (ref 0–53)
AST: 15 U/L (ref 0–37)
Albumin: 4.7 g/dL (ref 3.5–5.2)
Alkaline Phosphatase: 84 U/L (ref 39–117)
BUN: 14 mg/dL (ref 6–23)
CO2: 29 meq/L (ref 19–32)
Calcium: 9.3 mg/dL (ref 8.4–10.5)
Chloride: 106 meq/L (ref 96–112)
Creatinine, Ser: 1.25 mg/dL (ref 0.40–1.50)
GFR: 60.23 mL/min (ref >60.00)
Glucose, Bld: 102 mg/dL — ABNORMAL HIGH (ref 70–99)
Potassium: 4.6 meq/L (ref 3.5–5.1)
Sodium: 141 meq/L (ref 135–145)
Total Bilirubin: 0.5 mg/dL (ref 0.2–1.2)
Total Protein: 7 g/dL (ref 6.0–8.3)

## 2023-12-24 LAB — CBC WITH DIFFERENTIAL/PLATELET
Basophils Absolute: 0 K/uL (ref 0.0–0.1)
Basophils Relative: 0.4 % (ref 0.0–3.0)
Eosinophils Absolute: 0.1 K/uL (ref 0.0–0.7)
Eosinophils Relative: 2.8 % (ref 0.0–5.0)
HCT: 40.1 % (ref 39.0–52.0)
Hemoglobin: 13.1 g/dL (ref 13.0–17.0)
Lymphocytes Relative: 19.6 % (ref 12.0–46.0)
Lymphs Abs: 0.6 K/uL — ABNORMAL LOW (ref 0.7–4.0)
MCHC: 32.7 g/dL (ref 30.0–36.0)
MCV: 83.1 fl (ref 78.0–100.0)
Monocytes Absolute: 0.2 K/uL (ref 0.1–1.0)
Monocytes Relative: 5.7 % (ref 3.0–12.0)
Neutro Abs: 2.3 K/uL (ref 1.4–7.7)
Neutrophils Relative %: 71.5 % (ref 43.0–77.0)
Platelets: 141 K/uL — ABNORMAL LOW (ref 150.0–400.0)
RBC: 4.83 Mil/uL (ref 4.22–5.81)
RDW: 14.5 % (ref 11.5–15.5)
WBC: 3.2 K/uL — ABNORMAL LOW (ref 4.0–10.5)

## 2023-12-24 NOTE — Progress Notes (Addendum)
 Subjective:    Patient ID: David Smith, male    DOB: 07/29/1955, 68 y.o.   MRN: [id number]  DOS:  12/24/2023 Type of visit - description: Acute  Developed multiple GI symptoms in the last 2 weeks.  The most bothersome is dysphagia, to solids and liquids.  Feels that he needs to pressure the food down his throat. Denies odynophagia per se. No recent weight loss. No classic heartburn. No nausea or vomiting.  No fever or chills.    Constipation for 6 days.  In the last 5 days has take MiraLAX, bisacodyl  and Colace.  Finally today had 2 BMs, brown in color, no blood.  Feels a little better.  Also, having R side pain, located at the right lateral chest and flank. Denies any rash. Pain is on and off ; does not increase by eating.  No gross hematuria Saw urology, they rx a US  which was essentially normal according to the patient. Review of Systems See above   Past Medical History:  Diagnosis Date   Anemia    Post radiation, bloody urine when he drinks/eats acidic things   Chronic lower back pain 06/11/2012   daily a problem Pain level 7, Tylenol  helpful(down to level 2)   Depression    Due to med for his cancer - Lupron    Dysrhythmia    A. Fib   H/O cardiovascular stress test    non ischemic EF 55% 05-15-2007   Heart murmur    dx as a teen, Stress ECHO 2008 (-)    Hypertension    Hypertrophic obstructive cardiomyopathy (HOCM) (HCC)    PAF (paroxysmal atrial fibrillation) (HCC)    Prostate cancer metastatic to intrapelvic lymph node (HCC)    gleason 4+3=7, volume 56.4 cc, PSA 34.25    Past Surgical History:  Procedure Laterality Date   ACHILLES TENDON REPAIR     26 yrs ago, right foot   ANAL FISSURECTOMY  2008   ANTERIOR CERVICAL DECOMP/DISCECTOMY FUSION N/A 01/05/2021   Procedure: Cervical Three Corpectomy;  Surgeon: Gillie Duncans, MD;  Location: [facility];  Service: Neurosurgery;  Laterality: N/A;   COLONOSCOPY W/ POLYPECTOMY  2017   dental procedure  06/11/2012    minor -office procedure to correct jaw issue-used anesthesia gas to do   EYE SURGERY Left 2004   piece of small metal removed from eye   LYMPHADENECTOMY  06/17/2012   Procedure: LYMPHADENECTOMY;  Surgeon: Noretta Ferrara, MD;  Location: [facility];  Service: Urology;  Laterality: Bilateral;   POSTERIOR CERVICAL LAMINECTOMY N/A 09/27/2022   Procedure: Posterior Cervical Laminectomy - Cervical Three - Cervical Four;  Surgeon: Gillie Duncans, MD;  Location: [facility];  Service: Neurosurgery;  Laterality: N/A;   PROSTATE BIOPSY     ROBOT ASSISTED LAPAROSCOPIC RADICAL PROSTATECTOMY  06/17/2012   Procedure: ROBOTIC ASSISTED LAPAROSCOPIC RADICAL PROSTATECTOMY LEVEL 2;  Surgeon: Noretta Ferrara, MD;  Location: [facility];  Service: Urology;  Laterality: N/A;       Current Outpatient Medications  Medication Instructions   ALPRAZolam  (XANAX ) 0.25-0.5 mg, Oral, 2 times daily PRN   apixaban  (ELIQUIS ) 5 mg, Oral, 2 times daily   diltiazem  (CARTIA  XT) 240 mg, Oral, Daily, Pt needs to schedule appt with provider for further refills   meclizine  (ANTIVERT ) 25 MG tablet TAKE 1 TABLET BY MOUTH THREE TIMES DAILY AS NEEDED FOR DIZZINESS   Multiple Vitamin (MULTIVITAMIN WITH MINERALS) TABS tablet 1 tablet, Daily   Oxycodone  HCl 10 mg, Oral, Every 4 hours PRN  rosuvastatin  (CRESTOR ) 20 mg, Oral, Daily   tiZANidine  (ZANAFLEX ) 4 mg, Oral, Every 6 hours PRN       Objective:   Physical Exam BP (!) 146/80   Pulse 62   Temp 98.4 F (36.9 C) (Oral)   Resp 16   Ht 6' 1 (1.854 m)   Wt 246 lb 8 oz (111.8 kg)   SpO2 97%   BMI 32.52 kg/m  General:   Well developed, NAD, BMI noted.  HEENT:  Normocephalic . Face symmetric, atraumatic Neck: No lymphadenopathy, no thyromegaly.  No stridor. Lungs:  CTA B Normal respiratory effort, no intercostal retractions, no accessory muscle use. Heart: RRR, + murmur.  Abdomen:  Not distended, soft, non-tender. No rebound or rigidity. DRE: Normal sphincter tone, no impaction.  Brown  stools Skin: Not pale. Not jaundice Lower extremities: no pretibial edema bilaterally  Neurologic:  alert & oriented X3.  Speech normal, gait appropriate for age and unassisted Psych--  Cognition and judgment appear intact.  Cooperative with normal attention span and concentration.  Behavior appropriate. No anxious or depressed appearing.     Assessment   Problem list: No transfusions, Jehovah witness hyperglycemia: 01-2016 A1C >>> 6.0 HTN Dyslipidemia Insomnia-- no need for med as off 07/2017 Chronic back pain, offered surgery at some point, declined  CV: ---New onset A. fib (02-2020) ---New diagnosis HOCM (02-2020) --- Ascending Aorta dilatation GU: --Prostate cancer, Hormone sensitive.DX 2013, PSA 34.25, s/p prostatectomy, lymphadenectomy 2014. S/p XRT; + lymph nods mets , PSA increased, s/p lupron    --Hematuria- CT -cysto 2016 per urology-- likely d/t h/o XRT Mild anemia, normal iron and ferritin 06-2015 Syncope, dizziness  headaches:  -Saw neurology 4-17,w/u done, CTA head, neck, then neck MRI: dx w/ spinal compression per MRI.  Saw ENT 03-2021. -Posterior cervical laminectomy April 2024 Shingles, R face,2017    PLAN: Dysphagia, constipation, abdominal pain, right-sided. Multiple GI symptoms in the last 2 weeks. --Dysphagia as described above, in the context of history of cervical spine surgery, prostate cancer with chest metastasis s/p XRT .  Neck exam is benign. Plan: chest x-ray, referral to GI. --Constipation: Not taking painkillers, resolved with OTCs, no impaction.  Recommend MiraLAX for the next following days. --Right-sided abdominal pain: With no rash, recommend ultrasound. HTN, check BMP and CBC. Prostate cancer. managed by urology, LOV 12/19/2023. Had metastasis: mediastinal  and parasacral lymph nodes last year, status post radiation therapy. Cardiomyopathy, paroxysmal A-fib.  Ascending aortic aneurysm: LOV cardiology 06/22/2023, they ordered a CTA chest. RTC 6  weeks

## 2023-12-24 NOTE — Patient Instructions (Signed)
 Continue MiraLAX 17 g daily  We are referring you to gastroenterology.  If your symptoms get worse quickly let me know   GO TO THE LAB :  Get the blood work   Your results will be posted on MyChart with my comments  Next office visit for a checkup in 6 weeks Please make an appointment before you leave today    STOP BY THE FIRST FLOOR:  get the XR, arrange for an ultrasound of your abdomen

## 2023-12-24 NOTE — Assessment & Plan Note (Signed)
 Dysphagia, constipation, abdominal pain, right-sided. Multiple GI symptoms in the last 2 weeks. --Dysphagia as described above, in the context of history of cervical spine surgery, prostate cancer with chest metastasis s/p XRT .  Neck exam is benign. Plan: chest x-ray, referral to GI. --Constipation: Not taking painkillers, resolved with OTCs, no impaction.  Recommend MiraLAX for the next following days. --Right-sided abdominal pain: With no rash, recommend ultrasound. HTN, check BMP and CBC. Prostate cancer. managed by urology, LOV 12/19/2023. Had metastasis: mediastinal  and parasacral lymph nodes last year, status post radiation therapy. Cardiomyopathy, paroxysmal A-fib.  Ascending aortic aneurysm: LOV cardiology 06/22/2023, they ordered a CTA chest. RTC 6 weeks

## 2024-01-04 ENCOUNTER — Ambulatory Visit (HOSPITAL_BASED_OUTPATIENT_CLINIC_OR_DEPARTMENT_OTHER)
Admission: RE | Admit: 2024-01-04 | Discharge: 2024-01-04 | Disposition: A | Discharge status: Home / Self Care | Source: Ambulatory Visit | Attending: Internal Medicine | Admitting: Internal Medicine

## 2024-01-04 DIAGNOSIS — R1310 Dysphagia, unspecified: Secondary | ICD-10-CM | POA: Insufficient documentation

## 2024-01-04 DIAGNOSIS — R1031 Right lower quadrant pain: Secondary | ICD-10-CM | POA: Diagnosis not present

## 2024-02-05 ENCOUNTER — Ambulatory Visit (INDEPENDENT_AMBULATORY_CARE_PROVIDER_SITE_OTHER): Admitting: Internal Medicine

## 2024-02-05 ENCOUNTER — Encounter: Payer: Self-pay | Admitting: Internal Medicine

## 2024-02-05 VITALS — BP 138/80 | HR 60 | Temp 98.1°F | Resp 16 | Ht 73.0 in | Wt 250.4 lb

## 2024-02-05 DIAGNOSIS — C61 Malignant neoplasm of prostate: Secondary | ICD-10-CM

## 2024-02-05 DIAGNOSIS — E785 Hyperlipidemia, unspecified: Secondary | ICD-10-CM | POA: Diagnosis not present

## 2024-02-05 DIAGNOSIS — C778 Secondary and unspecified malignant neoplasm of lymph nodes of multiple regions: Secondary | ICD-10-CM | POA: Diagnosis not present

## 2024-02-05 DIAGNOSIS — I480 Paroxysmal atrial fibrillation: Secondary | ICD-10-CM

## 2024-02-05 DIAGNOSIS — I421 Obstructive hypertrophic cardiomyopathy: Secondary | ICD-10-CM

## 2024-02-05 DIAGNOSIS — R739 Hyperglycemia, unspecified: Secondary | ICD-10-CM

## 2024-02-05 DIAGNOSIS — I10 Essential (primary) hypertension: Secondary | ICD-10-CM

## 2024-02-05 NOTE — Progress Notes (Signed)
 Subjective:    Patient ID: David Smith, male    DOB: February 03, 1956, 68 y.o.   MRN: [id number]  DOS:  02/05/2024 Type of visit - description: Follow-up  No new symptoms. Still has some constipation. On Eliquis , denies any blood in the urine or in the stools. Ambulatory BPs when checked they are okay.   Review of Systems See above   Past Medical History:  Diagnosis Date   Anemia    Post radiation, bloody urine when he drinks/eats acidic things   Chronic lower back pain 06/11/2012   daily a problem Pain level 7, Tylenol  helpful(down to level 2)   Depression    Due to med for his cancer - Lupron    Dysrhythmia    A. Fib   H/O cardiovascular stress test    non ischemic EF 55% 05-15-2007   Heart murmur    dx as a teen, Stress ECHO 2008 (-)    Hypertension    Hypertrophic obstructive cardiomyopathy (HOCM) (HCC)    PAF (paroxysmal atrial fibrillation) (HCC)    Prostate cancer metastatic to intrapelvic lymph node (HCC)    gleason 4+3=7, volume 56.4 cc, PSA 34.25    Past Surgical History:  Procedure Laterality Date   ACHILLES TENDON REPAIR     26 yrs ago, right foot   ANAL FISSURECTOMY  2008   ANTERIOR CERVICAL DECOMP/DISCECTOMY FUSION N/A 01/05/2021   Procedure: Cervical Three Corpectomy;  Surgeon: Gillie Duncans, MD;  Location: [facility];  Service: Neurosurgery;  Laterality: N/A;   COLONOSCOPY W/ POLYPECTOMY  2017   dental procedure  06/11/2012   minor -office procedure to correct jaw issue-used anesthesia gas to do   EYE SURGERY Left 2004   piece of small metal removed from eye   LYMPHADENECTOMY  06/17/2012   Procedure: LYMPHADENECTOMY;  Surgeon: Noretta Ferrara, MD;  Location: [facility];  Service: Urology;  Laterality: Bilateral;   POSTERIOR CERVICAL LAMINECTOMY N/A 09/27/2022   Procedure: Posterior Cervical Laminectomy - Cervical Three - Cervical Four;  Surgeon: Gillie Duncans, MD;  Location: [facility];  Service: Neurosurgery;  Laterality: N/A;   PROSTATE BIOPSY     ROBOT ASSISTED  LAPAROSCOPIC RADICAL PROSTATECTOMY  06/17/2012   Procedure: ROBOTIC ASSISTED LAPAROSCOPIC RADICAL PROSTATECTOMY LEVEL 2;  Surgeon: Noretta Ferrara, MD;  Location: [facility];  Service: Urology;  Laterality: N/A;       Current Outpatient Medications  Medication Instructions   ALPRAZolam  (XANAX ) 0.25-0.5 mg, Oral, 2 times daily PRN   apixaban  (ELIQUIS ) 5 mg, Oral, 2 times daily   diltiazem  (CARTIA  XT) 240 mg, Oral, Daily, Pt needs to schedule appt with provider for further refills   meclizine  (ANTIVERT ) 25 MG tablet TAKE 1 TABLET BY MOUTH THREE TIMES DAILY AS NEEDED FOR DIZZINESS   Multiple Vitamin (MULTIVITAMIN WITH MINERALS) TABS tablet 1 tablet, Daily   rosuvastatin  (CRESTOR ) 20 mg, Oral, Daily   tiZANidine  (ZANAFLEX ) 4 mg, Oral, Every 6 hours PRN       Objective:   Physical Exam BP 138/80   Pulse 60   Temp 98.1 F (36.7 C) (Oral)   Resp 16   Ht 6' 1 (1.854 m)   Wt 250 lb 6 oz (113.6 kg)   SpO2 95%   BMI 33.03 kg/m  General:   Well developed, NAD, BMI noted. HEENT:  Normocephalic . Face symmetric, atraumatic Lungs:  CTA B Normal respiratory effort, no intercostal retractions, no accessory muscle use. Heart: Seems regular Lower extremities: no pretibial edema bilaterally  Skin: Not pale. Not  jaundice Neurologic:  alert & oriented X3.  Speech normal, gait assisted by cane. Psych--  Cognition and judgment appear intact.  Cooperative with normal attention span and concentration.  Behavior appropriate. No anxious or depressed appearing.      Assessment    Problem list: No transfusions, Jehovah witness hyperglycemia: 01-2016 A1C >>> 6.0 HTN Dyslipidemia CV: ---New onset A. fib (02-2020) ---New diagnosis HOCM (02-2020) --- Ascending Aorta dilatation GU: --Prostate cancer, Hormone sensitive. DX 2013, PSA 34.25, s/p prostatectomy, lymphadenectomy 2014. S/p XRT; + lymph nods mets , PSA increased, s/p lupron    --Hematuria- CT -cysto 2016 per urology-- likely d/t h/o  XRT --2021 PSA undetectable.   --2022 stopped ADT --February 2024: PET scan showed oligometastatic prostate cancer disease with mediastinal and presacral nodes activity, did XRT Spinal stenosis cervical : surgery 2022 and a posterior cervical laminectomy April 2024.  Mild anemia, normal iron and ferritin 06-2015 Syncope, dizziness  headaches:  -Saw neurology 4-17,w/u done, CTA head, neck, then neck MRI: dx w/ spinal compression per MRI.  Saw ENT 03-2021. Shingles, R face,2017   Insomnia-- no need for med as off 07/2017  PLAN: Hyperglycemia: Check A1c HTN: BP looks very good, on Cartia . Dyslipidemia: On rosuvastatin , last LFTs normal, check FLP, patient fasting. Paroxysmal A-fib, HOCM: Per cardiology, seems stable, tolerating anticoagulation. Dysphagia, constipation, abdominal pain, right-sided. Multiple GI symptoms: See LOV, ultrasound of the abdomen show hepatic steatosis, hemoglobin stable, chest x-ray negative.  To see GI soon Spinal stenosis sequela: Had 2 surgeries, walks with difficulty, uses a cane, parking permit signed. Prostate cancer w/ mets in multiple locations per urology. Preventive care: Reports he already had a flu shot, recommend a RSV COVID booster RTC for a physical exam around 06-2024

## 2024-02-05 NOTE — Patient Instructions (Addendum)
 Vaccines to consider: COVID booster RSV  Check the  blood pressure regularly Blood pressure goal:  between 110/65 and  135/85. If it is consistently higher or lower, let me know     GO TO THE LAB :  Get the blood work   Your results will be posted on MyChart with my comments  Go to the front desk for the checkout Please make an appointment for a physical exam in 4 months

## 2024-02-05 NOTE — Assessment & Plan Note (Signed)
 Hyperglycemia: Check A1c HTN: BP looks very good, on Cartia . Dyslipidemia: On rosuvastatin , last LFTs normal, check FLP, patient fasting. Paroxysmal A-fib, HOCM: Per cardiology, seems stable, tolerating anticoagulation. Dysphagia, constipation, abdominal pain, right-sided. Multiple GI symptoms: See LOV, ultrasound of the abdomen show hepatic steatosis, hemoglobin stable, chest x-ray negative.  To see GI soon Spinal stenosis sequela: Had 2 surgeries, walks with difficulty, uses a cane, parking permit signed. Prostate cancer w/ mets in multiple locations per urology. Preventive care: Reports he already had a flu shot, recommend a RSV COVID booster RTC for a physical exam around 06-2024

## 2024-02-06 ENCOUNTER — Ambulatory Visit: Payer: Self-pay | Admitting: Internal Medicine

## 2024-02-06 LAB — LIPID PANEL
Cholesterol: 121 mg/dL (ref 0–200)
HDL: 43.5 mg/dL (ref >39.00)
LDL Cholesterol: 69 mg/dL (ref 0–99)
NonHDL: 77.89
Total CHOL/HDL Ratio: 3
Triglycerides: 45 mg/dL (ref 0.0–149.0)
VLDL: 9 mg/dL (ref 0.0–40.0)

## 2024-02-06 LAB — HEMOGLOBIN A1C: Hgb A1c MFr Bld: 6.2 % (ref 4.6–6.5)

## 2024-02-07 ENCOUNTER — Ambulatory Visit: Admitting: Physician Assistant

## 2024-02-12 ENCOUNTER — Ambulatory Visit: Admitting: Gastroenterology

## 2024-02-12 ENCOUNTER — Telehealth: Payer: Self-pay

## 2024-02-12 ENCOUNTER — Encounter: Payer: Self-pay | Admitting: Gastroenterology

## 2024-02-12 VITALS — BP 142/80 | HR 87 | Ht 73.0 in | Wt 247.0 lb

## 2024-02-12 DIAGNOSIS — R1310 Dysphagia, unspecified: Secondary | ICD-10-CM

## 2024-02-12 DIAGNOSIS — Z7902 Long term (current) use of antithrombotics/antiplatelets: Secondary | ICD-10-CM | POA: Diagnosis not present

## 2024-02-12 DIAGNOSIS — R1319 Other dysphagia: Secondary | ICD-10-CM

## 2024-02-12 DIAGNOSIS — Z860101 Personal history of adenomatous and serrated colon polyps: Secondary | ICD-10-CM

## 2024-02-12 DIAGNOSIS — K5909 Other constipation: Secondary | ICD-10-CM | POA: Diagnosis not present

## 2024-02-12 NOTE — Progress Notes (Signed)
 [facility] Consult Note:  History: David Smith 02/12/2024  Referring provider: [name], MD  Reason for consult/chief complaint: Colon Cancer Screening (Patient would like to schedule a colonoscopy. Last one was in 2017. Patient is on Eliquis .) and Dysphagia (Patient has had difficulty swallowing for the 3 months. PCP recommended he discuss with GI provider.)   Subjective  Prior history:  Colonoscopy with Dr. Aneita February 2017:  2 SubCM tubular adenomas, mild radiation proctitis   Discussed the use of AI scribe software for clinical note transcription with the patient, who gave verbal consent to proceed.  History of Present Illness David Smith is a 68 year old male with a history of prostate cancer and precancerous colon polyps who presents with changes in bowel habits and swallowing difficulties.  Over the past four to five months, he has experienced changes in bowel habits, characterized by constipation with bowel movements occurring every four to six days. He uses Miralax twice a week, which provides relief, and occasionally uses a stool softener. He has tried dietary supplements like chia seeds and papaya seeds to improve gut health.  He reports swallowing difficulties, describing a sensation of tightness and needing to apply more pressure when swallowing. He experiences increased burping after meals, which he attributes to air pockets. No pain or difficulty swallowing, and no blood in stools.  He has a history of prostate cancer treated with radiation, with metastasis to the chest, and precancerous colon polyps identified during a colonoscopy in February 2017. He has not had a follow-up colonoscopy since then. He is a TEFL teacher Witness and does not accept whole blood transfusions, although he is open to certain blood fractions.  He has some belching, and sometimes feels there is air trapped in the lower chest or upper abdomen after swallowing.   Denies vomiting loss of appetite or weight loss.  Denies rectal bleeding.  ROS:  Review of Systems  Constitutional:  Negative for appetite change and unexpected weight change.  HENT:  Negative for mouth sores and voice change.   Eyes:  Negative for pain and redness.  Respiratory:  Negative for cough and shortness of breath.   Cardiovascular:  Negative for chest pain and palpitations.  Genitourinary:  Negative for dysuria and hematuria.  Musculoskeletal:  Negative for arthralgias and myalgias.  Skin:  Negative for pallor and rash.  Neurological:  Negative for weakness and headaches.  Hematological:  Negative for adenopathy.     Past Medical History: Past Medical History:  Diagnosis Date   Anal fissure    Anemia    Post radiation, bloody urine when he drinks/eats acidic things   Anxiety    Chronic lower back pain 06/11/2012   daily a problem Pain level 7, Tylenol  helpful(down to level 2)   Colon polyps    Depression    Due to med for his cancer - Lupron    Dysrhythmia    A. Fib   H/O cardiovascular stress test    non ischemic EF 55% 05-15-2007   Heart murmur    dx as a teen, Stress ECHO 2008 (-)    Hypertension    Hypertrophic obstructive cardiomyopathy (HOCM) (HCC)    PAF (paroxysmal atrial fibrillation) (HCC)    Prostate cancer metastatic to intrapelvic lymph node (HCC)    gleason 4+3=7, volume 56.4 cc, PSA 34.25   From Jan 2025 Cardiology note:  Hypertrophic Obstructive Cardiomyopathy Stable with improvement in LVOT gradient from 57 to 10 mmHg. No syncope or family  history of sudden death. Low risk for sudden cardiac death. -Low risk monitor. Low risk CMR. Low risk stress test.  -plan to repeat monitor and echo next year  -Continue current management and monitor for changes.   Paroxysmal Atrial Fibrillation Infrequent episodes of rapid heartbeat lasting several hours. Currently managed with Diltiazem  240mg  ER daily and Eliquis  5mg  BID. -Continue current  medications. -Permitted to take an extra Diltiazem  240mg  ER during episodes of rapid heartbeat. -Monitor for increased frequency of episodes.  Ascending Aortic Aneurysm Stable with measurements of 44mm on CMR and 46mm on echo last year. -Order CTA chest to monitor for changes.   Hyperlipidemia Well controlled with Crestor  20mg  daily. Most recent LDL 66. -Continue Crestor  20mg  daily   Vertigo Recurrent episodes, potentially related to recent spinal surgery. Currently managed with over-the-counter Meclizine  25mg . -Continue Meclizine  as needed for vertigo episodes.    Past Surgical History: Past Surgical History:  Procedure Laterality Date   ACHILLES TENDON REPAIR     26 yrs ago, right foot   ANAL FISSURECTOMY  2008   ANTERIOR CERVICAL DECOMP/DISCECTOMY FUSION N/A 01/05/2021   Procedure: Cervical Three Corpectomy;  Surgeon: Gillie Duncans, MD;  Location: [facility];  Service: Neurosurgery;  Laterality: N/A;   COLONOSCOPY W/ POLYPECTOMY  2017   dental procedure  06/11/2012   minor -office procedure to correct jaw issue-used anesthesia gas to do   EYE SURGERY Left 2004   piece of small metal removed from eye   LYMPHADENECTOMY  06/17/2012   Procedure: LYMPHADENECTOMY;  Surgeon: Noretta Ferrara, MD;  Location: [facility];  Service: Urology;  Laterality: Bilateral;   POSTERIOR CERVICAL LAMINECTOMY N/A 09/27/2022   Procedure: Posterior Cervical Laminectomy - Cervical Three - Cervical Four;  Surgeon: Gillie Duncans, MD;  Location: [facility];  Service: Neurosurgery;  Laterality: N/A;   PROSTATE BIOPSY     ROBOT ASSISTED LAPAROSCOPIC RADICAL PROSTATECTOMY  06/17/2012   Procedure: ROBOTIC ASSISTED LAPAROSCOPIC RADICAL PROSTATECTOMY LEVEL 2;  Surgeon: Noretta Ferrara, MD;  Location: [facility];  Service: Urology;  Laterality: N/A;        Family History: Family History  Problem Relation Age of Onset   Cancer Mother        breast   Heart disease Mother    Cancer Father 38       prostate, removed   Cancer Brother  89       prostate, tx w/xrt   Colon cancer Neg Hx    Colon polyps Neg Hx     Social History: Social History   Socioeconomic History   Marital status: Widowed    Spouse name: Not on file   Number of children: 3   Years of education: Not on file   Highest education level: 12th grade  Occupational History   Occupation: disability d/t back pain-not working at present  Tobacco Use   Smoking status: Never   Smokeless tobacco: Never  Vaping Use   Vaping status: Never Used  Substance and Sexual Activity   Alcohol use: Not Currently    Alcohol/week: 0.0 standard drinks of alcohol   Drug use: No   Sexual activity: Not Currently  Other Topics Concern   Not on file  Social History Narrative   Household- pt and daughter   Social Drivers of Health   Financial Resource Strain: Medium Risk (12/23/2023)   Overall Financial Resource Strain (CARDIA)    Difficulty of Paying Living Expenses: Somewhat hard  Food Insecurity: Food Insecurity Present (12/23/2023)   Hunger Vital Sign  Worried About Programme researcher, broadcasting/film/video in the Last Year: Sometimes true    [employer] of Food in the Last Year: Never true  Transportation Needs: No Transportation Needs (12/23/2023)   PRAPARE - Administrator, Civil Service (Medical): No    Lack of Transportation (Non-Medical): No  Physical Activity: Insufficiently Active (12/23/2023)   Exercise Vital Sign    Days of Exercise per Week: 2 days    Minutes of Exercise per Session: 30 min  Stress: No Stress Concern Present (12/23/2023)   [employer] of Occupational Health - Occupational Stress Questionnaire    Feeling of Stress: Only a little  Social Connections: Moderately Integrated (12/23/2023)   Social Connection and Isolation Panel    Frequency of Communication with Friends and Family: Three times a week    Frequency of Social Gatherings with Friends and Family: Twice a week    Attends Religious Services: More than 4 times per year    Active Member  of [employer] or Organizations: Yes    Attends Banker Meetings: More than 4 times per year    Marital Status: Widowed    Allergies: No Known Allergies  Outpatient Meds: Current Outpatient Medications  Medication Sig Dispense Refill   ALPRAZolam  (XANAX ) 0.25 MG tablet Take 1-2 tablets (0.25-0.5 mg total) by mouth 2 (two) times daily as needed for anxiety. 20 tablet 0   apixaban  (ELIQUIS ) 5 MG TABS tablet Take 1 tablet (5 mg total) by mouth 2 (two) times daily. 90 tablet 3   diltiazem  (CARTIA  XT) 240 MG 24 hr capsule Take 1 capsule (240 mg total) by mouth daily. Pt needs to schedule appt with provider for further refills 90 capsule 3   meclizine  (ANTIVERT ) 25 MG tablet TAKE 1 TABLET BY MOUTH THREE TIMES DAILY AS NEEDED FOR DIZZINESS 60 tablet 2   methocarbamol (ROBAXIN) 500 MG tablet Take 500 mg by mouth every 8 (eight) hours as needed.     Multiple Vitamin (MULTIVITAMIN WITH MINERALS) TABS tablet Take 1 tablet by mouth daily.     rosuvastatin  (CRESTOR ) 20 MG tablet Take 1 tablet (20 mg total) by mouth daily. 90 tablet 3   tiZANidine  (ZANAFLEX ) 4 MG tablet Take 1 tablet (4 mg total) by mouth every 6 (six) hours as needed for muscle spasms. 60 tablet 0   No current facility-administered medications for this visit.      ___________________________________________________________________ Objective   Exam:  BP (!) 142/80 (BP Location: Right Arm, Patient Position: Sitting, Cuff Size: Normal)   Pulse 87   Ht 6' 1 (1.854 m)   Wt 247 lb (112 kg)   BMI 32.59 kg/m  Wt Readings from Last 3 Encounters:  02/12/24 247 lb (112 kg)  02/05/24 250 lb 6 oz (113.6 kg)  12/24/23 246 lb 8 oz (111.8 kg)    General: Antalgic gait, uses a cane, gets on exam table slowly but without assistance. Eyes: sclera anicteric, no redness ENT: oral mucosa moist without lesions, no cervical or supraclavicular lymphadenopathy CV: Regular with a diastolic murmur heard best at the RUSB, no JVD, +  peripheral edema Resp: clear to auscultation bilaterally, normal RR and effort noted GI: soft, no tenderness, with active bowel sounds. No guarding or palpable organomegaly noted. Skin; warm and dry, no rash or jaundice noted Neuro: awake, alert and oriented x 3. Normal gross motor function and fluent speech   Labs:     Latest Ref Rng & Units 12/24/2023    9:15 AM 06/18/2023  2:56 PM 09/18/2022   12:00 PM  CBC  WBC 4.0 - 10.5 K/uL 3.2  4.7  3.3   Hemoglobin 13.0 - 17.0 g/dL 86.8  86.7  86.5   Hematocrit 39.0 - 52.0 % 40.1  41.4  41.3   Platelets 150.0 - 400.0 K/uL 141.0  162.0  104       Latest Ref Rng & Units 12/24/2023    9:15 AM 06/18/2023    2:56 PM 09/18/2022   12:00 PM  CMP  Glucose 70 - 99 mg/dL 897  88  90   BUN 6 - 23 mg/dL 14  18  15    Creatinine 0.40 - 1.50 mg/dL 8.74  8.77  8.81   Sodium 135 - 145 mEq/L 141  143  139   Potassium 3.5 - 5.1 mEq/L 4.6  4.2  4.1   Chloride 96 - 112 mEq/L 106  106  107   CO2 19 - 32 mEq/L 29  29  25    Calcium  8.4 - 10.5 mg/dL 9.3  9.5  8.7   Total Protein 6.0 - 8.3 g/dL 7.0  7.1    Total Bilirubin 0.2 - 1.2 mg/dL 0.5  0.4    Alkaline Phos 39 - 117 U/L 84  93    AST 0 - 37 U/L 15  19    ALT 0 - 53 U/L 16  25       Radiologic Studies:  TTE 07/07/2022  1. Severe hypertrophy of basal septum; mild LVOT gradient at rest 1.4 m/s  increasing to 1.8 m/s with valsalva (findings c/w HCM); moderately dilated  ascending aorta (4.6 cm).   2. Left ventricular ejection fraction, by estimation, is 60 to 65%. The  left ventricle has normal function. The left ventricle has no regional  wall motion abnormalities. There is severe left ventricular hypertrophy of  the basal-septal segment. Left  ventricular diastolic parameters are consistent with Grade I diastolic  dysfunction (impaired relaxation).   3. Right ventricular systolic function is normal. The right ventricular  size is normal. There is normal pulmonary artery systolic pressure.   4.  Left atrial size was moderately dilated.   5. The mitral valve is normal in structure. Mild mitral valve  regurgitation. No evidence of mitral stenosis.   6. The aortic valve is tricuspid. Aortic valve regurgitation is mild to  moderate. No aortic stenosis is present.   7. Aortic dilatation noted. There is borderline dilatation of the aortic  root, measuring 39 mm. There is moderate dilatation of the ascending  aorta, measuring 46 mm.   8. The inferior vena cava is normal in size with greater than 50%  respiratory variability, suggesting right atrial pressure of 3 mmHg.    Encounter Diagnoses  Name Primary?   Chronic constipation Yes   Esophageal dysphagia    Long term (current) use of antithrombotics/antiplatelets     Assessment and Plan Assessment & Plan Constipation Chronic constipation with infrequent bowel movements. Discussed importance of regular bowel movements, adequate fluid intake, and recommendation of more regular Miralax use. Considered stimulant laxatives if needed (he gets good effect from a 10 mg Dulcolax tablet once or twice a week). - Increase fluid intake to at least half a gallon of water per day. - Use Miralax more regularly, potentially daily, starting with a lower dose such as half a capful, and adjust as needed. - Use Dulcolax once or twice a week if needed for more immediate relief.  Dysphagia Recent onset of dysphagia with  tightness and effort in swallowing. No pain or obstruction.  Sounds likely benign.  Discussed potential neurologic causes and age-related changes. Evaluation warranted. - Plan for an upper endoscopy to evaluate the cause of dysphagia.  Personal history of colonic polyps History of small precancerous polyps. Due for follow-up colonoscopy due to history and bowel habit changes. Emphasized importance of bowel preparation. - Schedule a colonoscopy to evaluate for recurrence of polyps and assess bowel changes. - Ensure adequate bowel  preparation prior to colonoscopy.  Follow-Up Discussed need for follow-up procedures and coordination with healthcare providers. Plan to coordinate with anesthesia and cardiology for procedural safety. - Coordinate with anesthesia provider to review the chart and determine the appropriate setting for the procedures. - Communicate with cardiologist regarding temporary cessation of blood thinners before procedures. - Schedule both upper endoscopy and colonoscopy on the same day, pending anesthesia review.  He was agreeable to endoscopic procedures after discussion of them and the risks and benefits.  The benefits and risks of the planned procedure(s) were described in detail with the patient or (when appropriate) their health care proxy.  Risks were outlined as including, but not limited to, bleeding, infection, perforation, adverse medication reaction leading to cardiac or pulmonary decompensation, pancreatitis (if ERCP).  The limitation of incomplete mucosal visualization was also discussed.  No guarantees or warranties were given. Patient at increased risk for cardiopulmonary complications of procedure due to medical comorbidities.  He needs a 2-day hold of his oral anticoagulation, and was made aware of the small but real risks of cardiac events or stroke from anesthesia and the brief hold of the OAC.  I will send his chart to our anesthesia provider for review and to his cardiologist.  I note that at the last cardiology visit in January of this year, there was a plan for a CTA of the chest to follow-up his aortic aneurysm.  If that was done, I cannot seem to find a report of it.  So we will check in with his cardiologist about that.   Thank you for the courtesy of this consult.  Please call me with any questions or concerns.  Victory LITTIE Brand III  CC: Referring provider noted above

## 2024-02-12 NOTE — Patient Instructions (Addendum)
 You have been scheduled for an endoscopy and colonoscopy. Please follow the written instructions given to you at your visit today.  If you use inhalers (even only as needed), please bring them with you on the day of your procedure.  DO NOT TAKE 7 DAYS PRIOR TO TEST- Trulicity (dulaglutide) Ozempic, Wegovy (semaglutide) Mounjaro (tirzepatide) Bydureon Bcise (exanatide extended release)  DO NOT TAKE 1 DAY PRIOR TO YOUR TEST Rybelsus (semaglutide) Adlyxin (lixisenatide) Victoza (liraglutide) Byetta (exanatide) ___________________________________________________________________________   Rosine will be contacted by our office prior to your procedure for directions on holding your Eliquis .  If you do not hear from our office 1 week prior to your scheduled procedure, please call [phone] to discuss.    _______________________________________________________  If your blood pressure at your visit was 140/90 or greater, please contact your primary care physician to follow up on this.  _______________________________________________________  If you are age 81 or older, your body mass index should be between 23-30. Your Body mass index is 32.59 kg/m. If this is out of the aforementioned range listed, please consider follow up with your Primary Care Provider.  If you are age 68 or younger, your body mass index should be between 19-25. Your Body mass index is 32.59 kg/m. If this is out of the aformentioned range listed, please consider follow up with your Primary Care Provider.   ________________________________________________________  The [facility] providers would like to encourage you to use MYCHART to communicate with providers for non-urgent requests or questions.  Due to long hold times on the telephone, sending your provider a message by [facility] may be a faster and more efficient way to get a response.  Please allow 48 business hours for a response.  Please remember that this is for  non-urgent requests.  _______________________________________________________  [facility] is using a team-based approach to care.  Your team is made up of your doctor and two to three APPS. Our APPS (Nurse Practitioners and Physician Assistants) work with your physician to ensure care continuity for you. They are fully qualified to address your health concerns and develop a treatment plan. They communicate directly with your gastroenterologist to care for you. Seeing the Advanced Practice Practitioners on your physician's team can help you by facilitating care more promptly, often allowing for earlier appointments, access to diagnostic testing, procedures, and other specialty referrals.    Thank you for trusting me with your gastrointestinal care!    Dr. Victory Legrand DOUGLAS [facility]

## 2024-02-12 NOTE — Telephone Encounter (Signed)
 [facility] Pre-operative Risk Assessment     Request for surgical clearance:     Endoscopy Procedure  What type of surgery is being performed?     EGD/Colon  When is this surgery scheduled?     03/20/24  What type of clearance is required ?   Pharmacy  Are there any medications that need to be held prior to surgery and how long? Eliquis  2 days  Practice name and name of physician performing surgery?      [facility]  What is your office phone and fax number?      Phone- [phone]  Fax- [phone]  Anesthesia type (None, local, MAC, general) ?       MAC   Please route your response to Corean Amsterdam, [facility]

## 2024-02-13 ENCOUNTER — Telehealth: Payer: Self-pay | Admitting: Gastroenterology

## 2024-02-13 NOTE — Telephone Encounter (Signed)
 Noted thank you

## 2024-02-13 NOTE — Telephone Encounter (Signed)
 Nursing,  I saw this patient in clinic yesterday, and he is scheduled for an EGD and colonoscopy with me in the [facility] on 03/20/2024. He has a cardiac history, having last been seen by Dr. Barbaraann at [facility] in January of this year.  His chart was sent to our anesthesia provider Norleen Oar, and he requests cardiology evaluation and clearance prior to the endoscopic procedures.  I have copied this to the patient's cardiologist to help expedite that.  If the patient can be seen in cardiology clinic by October 1, then procedures can be kept on schedule.  If not, they need to be pushed back to allow time for cardiology evaluation and subsequent anesthesia chart review.  Thank you  - H. Danis  ______________  Dr Barbaraann,  I routed my note from yesterday's visit to you.  Please review and assist us  with a cardiology clinic evaluation for this nice man.  Thank you  Victory Brand, [facility]

## 2024-02-13 NOTE — Telephone Encounter (Signed)
 Yes, I am working on this. I updated his clearance to reflect cardiac as well as his eliquis .

## 2024-02-13 NOTE — Telephone Encounter (Signed)
 David Smith looks like you are working on this correct?

## 2024-02-14 NOTE — Telephone Encounter (Signed)
 Patient with diagnosis of atrial fibrillation on Eliquis  for anticoagulation.    What type of surgery is being performed?     EGD/Colon  When is this surgery scheduled?     03/20/24    CHA2DS2-VASc Score = 2   This indicates a 2.2% annual risk of stroke. The patient's score is based upon: CHF History: 0 HTN History: 1 Diabetes History: 0 Stroke History: 0 Vascular Disease History: 0 Age Score: 1 Gender Score: 0    CrCl 92 Platelet count 141  Patient has not had an Afib/aflutter ablation or Watchman within the last 3 months or DCCV within the last 30 days   Per office protocol, patient can hold Eliquis  for 2 days prior to procedure.   Patient will not need bridging with Lovenox (enoxaparin) around procedure.  **This guidance is not considered finalized until pre-operative APP has relayed final recommendations.**

## 2024-02-14 NOTE — Telephone Encounter (Signed)
   Name: David Smith  DOB: 20-Oct-1955  MRN: [id number]  Primary Cardiologist: [name] Decent, MD   Preoperative team, please contact this patient and set up a phone call appointment for further preoperative risk assessment. Please obtain consent and complete medication review. Thank you for your help.  I confirm that guidance regarding antiplatelet and oral anticoagulation therapy has been completed and, if necessary, noted below.  I also confirmed the patient resides in the state of  . As per [facility] Medical Board telemedicine laws, the patient must reside in the state in which the provider is licensed.   Jon Garre Canna Nickelson, PA 02/14/2024, 9:07 AM [facility]

## 2024-02-14 NOTE — Telephone Encounter (Signed)
 Vm is full cannot leave message to call back. Pt needs tele preop appt.

## 2024-02-18 NOTE — Telephone Encounter (Signed)
 Called patient to setup a televisit, no answer left a vm to call back

## 2024-02-21 NOTE — Telephone Encounter (Signed)
 3rd attempt : Called patient to contact our office to schedule cardiac clearance telehealth appointment.

## 2024-02-22 ENCOUNTER — Telehealth: Payer: Self-pay

## 2024-02-22 NOTE — Telephone Encounter (Signed)
 Preop tele appt now scheduled, med rec and consent done.

## 2024-02-22 NOTE — Telephone Encounter (Signed)
 4th attempt to schedule cardiac preop clearance, with update surgeon's office and remove from preop pool.

## 2024-02-22 NOTE — Telephone Encounter (Signed)
  Patient Consent for Virtual Visit        David Smith has provided verbal consent on 02/22/2024 for a virtual visit (video or telephone).   CONSENT FOR VIRTUAL VISIT FOR:  David Smith  By participating in this virtual visit I agree to the following:  I hereby voluntarily request, consent and authorize [facility] and its employed or contracted physicians, physician assistants, nurse practitioners or other licensed health care professionals (the Practitioner), to provide me with telemedicine health care services (the "Services) as deemed necessary by the treating Practitioner. I acknowledge and consent to receive the Services by the Practitioner via telemedicine. I understand that the telemedicine visit David involve communicating with the Practitioner through live audiovisual communication technology and the disclosure of certain medical information by electronic transmission. I acknowledge that I have been given the opportunity to request an in-person assessment or other available alternative prior to the telemedicine visit and am voluntarily participating in the telemedicine visit.  I understand that I have the right to withhold or withdraw my consent to the use of telemedicine in the course of my care at any time, without affecting my right to future care or treatment, and that the Practitioner or I may terminate the telemedicine visit at any time. I understand that I have the right to inspect all information obtained and/or recorded in the course of the telemedicine visit and may receive copies of available information for a reasonable fee.  I understand that some of the potential risks of receiving the Services via telemedicine include:  Delay or interruption in medical evaluation due to technological equipment failure or disruption; Information transmitted may not be sufficient (e.g. poor resolution of images) to allow for appropriate medical decision making by the  Practitioner; and/or  In rare instances, security protocols could fail, causing a breach of personal health information.  Furthermore, I acknowledge that it is my responsibility to provide information about my medical history, conditions and care that is complete and accurate to the best of my ability. I acknowledge that Practitioner's advice, recommendations, and/or decision may be based on factors not within their control, such as incomplete or inaccurate data provided by me or distortions of diagnostic images or specimens that may result from electronic transmissions. I understand that the practice of medicine is not an exact science and that Practitioner makes no warranties or guarantees regarding treatment outcomes. I acknowledge that a copy of this consent can be made available to me via my patient portal [facility] MyChart), or I can request a printed copy by calling the office of [facility].    I understand that my insurance David be billed for this visit.   I have read or had this consent read to me. I understand the contents of this consent, which adequately explains the benefits and risks of the Services being provided via telemedicine.  I have been provided ample opportunity to ask questions regarding this consent and the Services and have had my questions answered to my satisfaction. I give my informed consent for the services to be provided through the use of telemedicine in my medical care

## 2024-02-22 NOTE — Telephone Encounter (Signed)
 Patient returning call.

## 2024-02-22 NOTE — Telephone Encounter (Signed)
 Spoke with pt in regard to contacting cards as they have been unable to reach him to schedule a preop telephone appt. Pt states he spoke with cards earlier and they mentioned they couldn't schedule him as they haven't received the information they need from us . I'm not sure what information he is referring to as the clearance was sent on 02/12/24.   Please advise if there is something else needed for this pt to be scheduled to get cleared for his upcoming procedure. Thank you.   [facility] Pre-operative Risk Assessment     Request for surgical clearance:     Endoscopy Procedure  What type of surgery is being performed?     EGD/Colon  When is this surgery scheduled?     03/20/24  What type of clearance is required ?   Pharmacy AND cardiac  Are there any medications that need to be held prior to surgery and how long? Eliquis  2 days and cardiac clearance  Practice name and name of physician performing surgery?      [facility]  What is your office phone and fax number?      Phone- [phone]  Fax- [phone]  Anesthesia type (None, local, MAC, general) ?       MAC     Please route your response to Corean Amsterdam, [facility]

## 2024-02-22 NOTE — Telephone Encounter (Signed)
 I called the pt and LVM to let him know he needs an appt with cards in order to get his procedure and to call them to schedule.

## 2024-03-06 ENCOUNTER — Ambulatory Visit: Attending: Internal Medicine

## 2024-03-06 DIAGNOSIS — Z01810 Encounter for preprocedural cardiovascular examination: Secondary | ICD-10-CM

## 2024-03-06 NOTE — Progress Notes (Signed)
 Virtual Visit via Telephone Note   Because of David Smith co-morbid illnesses, he is at least at moderate risk for complications without adequate follow up.  This format is felt to be most appropriate for this patient at this time.  Due to technical limitations with video connection (technology), today's appointment David be conducted as an audio only telehealth visit, and David Smith verbally agreed to proceed in this manner.   All issues noted in this document were discussed and addressed.  No physical exam could be performed with this format.  Evaluation Performed:  Preoperative cardiovascular risk assessment _____________   Date:  03/06/2024   Patient ID:  David Smith, DOB [birth year], MRN [id number] Patient Location:  Home Provider location:   Office  Primary Care Provider:  [name], MD Primary Cardiologist:  [name] Decent, MD  Chief Complaint / Patient Profile   68 y.o. y/o male with a h/o HOCM, paroxysmal AF, ascending aortic aneurysm, HTN, HLD, prostate CA s/p XRT and prostatectomy who is pending endoscopy/colonoscopy and presents today for telephonic preoperative cardiovascular risk assessment.  History of Present Illness    David Smith is a 68 y.o. male who presents via audio/video conferencing for a telehealth visit today.  Pt was last seen in cardiology clinic on 06/22/2023 by Dr. Decent.  At that time David Smith was doing well but reported some chest discomfort from shoulders and up the neck from his surgical site.  The patient is now pending procedure as outlined above. Since his last visit, he has been doing well with no new cardiac complaints.  He reports no episodes of atrial fibrillation or tachycardia since his previous visit in January.  He denies chest pain, shortness of breath, lower extremity edema, fatigue, palpitations, melena, hematuria, hemoptysis, diaphoresis, weakness, presyncope, syncope, orthopnea, and PND.   Past Medical History     Past Medical History:  Diagnosis Date   Anal fissure    Anemia    Post radiation, bloody urine when he drinks/eats acidic things   Anxiety    Chronic lower back pain 06/11/2012   daily a problem Pain level 7, Tylenol  helpful(down to level 2)   Colon polyps    Depression    Due to med for his cancer - Lupron    Dysrhythmia    A. Fib   H/O cardiovascular stress test    non ischemic EF 55% 05-15-2007   Heart murmur    dx as a teen, Stress ECHO 2008 (-)    Hypertension    Hypertrophic obstructive cardiomyopathy (HOCM) (HCC)    PAF (paroxysmal atrial fibrillation) (HCC)    Prostate cancer metastatic to intrapelvic lymph node (HCC)    gleason 4+3=7, volume 56.4 cc, PSA 34.25   Past Surgical History:  Procedure Laterality Date   ACHILLES TENDON REPAIR     26 yrs ago, right foot   ANAL FISSURECTOMY  2008   ANTERIOR CERVICAL DECOMP/DISCECTOMY FUSION N/A 01/05/2021   Procedure: Cervical Three Corpectomy;  Surgeon: Gillie Duncans, MD;  Location: [facility] OR;  Service: Neurosurgery;  Laterality: N/A;   COLONOSCOPY W/ POLYPECTOMY  2017   dental procedure  06/11/2012   minor -office procedure to correct jaw issue-used anesthesia gas to do   EYE SURGERY Left 2004   piece of small metal removed from eye   LYMPHADENECTOMY  06/17/2012   Procedure: LYMPHADENECTOMY;  Surgeon: Noretta Ferrara, MD;  Location: [facility] ORS;  Service: Urology;  Laterality: Bilateral;   POSTERIOR CERVICAL LAMINECTOMY  N/A 09/27/2022   Procedure: Posterior Cervical Laminectomy - Cervical Three - Cervical Four;  Surgeon: Gillie Duncans, MD;  Location: [facility] OR;  Service: Neurosurgery;  Laterality: N/A;   PROSTATE BIOPSY     ROBOT ASSISTED LAPAROSCOPIC RADICAL PROSTATECTOMY  06/17/2012   Procedure: ROBOTIC ASSISTED LAPAROSCOPIC RADICAL PROSTATECTOMY LEVEL 2;  Surgeon: Noretta Ferrara, MD;  Location: [facility] ORS;  Service: Urology;  Laterality: N/A;       Allergies  No Known Allergies  Home Medications    Prior to Admission medications    Medication Sig Start Date End Date Taking? Authorizing Provider  ALPRAZolam  (XANAX ) 0.25 MG tablet Take 1-2 tablets (0.25-0.5 mg total) by mouth 2 (two) times daily as needed for anxiety. 11/22/21   [name], MD  apixaban  (ELIQUIS ) 5 MG TABS tablet Take 1 tablet (5 mg total) by mouth 2 (two) times daily. 06/22/23   O'NealDarryle Ned, MD  diltiazem  (CARTIA  XT) 240 MG 24 hr capsule Take 1 capsule (240 mg total) by mouth daily. Pt needs to schedule appt with provider for further refills 06/22/23   O'Neal, Darryle Ned, MD  meclizine  (ANTIVERT ) 25 MG tablet TAKE 1 TABLET BY MOUTH THREE TIMES DAILY AS NEEDED FOR DIZZINESS 10/09/23   O'Neal, Darryle Ned, MD  methocarbamol (ROBAXIN) 500 MG tablet Take 500 mg by mouth every 8 (eight) hours as needed. 10/24/22   [provider]  Multiple Vitamin (MULTIVITAMIN WITH MINERALS) TABS tablet Take 1 tablet by mouth daily.    [provider]  rosuvastatin  (CRESTOR ) 20 MG tablet Take 1 tablet (20 mg total) by mouth daily. 06/22/23   O'Neal, Darryle Ned, MD  tiZANidine  (ZANAFLEX ) 4 MG tablet Take 1 tablet (4 mg total) by mouth every 6 (six) hours as needed for muscle spasms. 01/07/21   Gillie Duncans, MD    Physical Exam    Vital Signs:  David Smith does not have vital signs available for review today.  Given telephonic nature of communication, physical exam is limited. AAOx3. NAD. Normal affect.  Speech and respirations are unlabored.  Accessory Clinical Findings    None  Assessment & Plan    1.  Preoperative Cardiovascular Risk Assessment: - Patient's RCRI score is 0.4%  The patient was advised that if he develops new symptoms prior to surgery to contact our office to arrange for a follow-up visit, and he verbalized understanding.  The patient affirms he has been doing well without any new cardiac symptoms. They are able to achieve 5 METS without cardiac limitations. Therefore, based on ACC/AHA guidelines, the patient would be  at acceptable risk for the planned procedure without further cardiovascular testing. The patient was advised that if he develops new symptoms prior to surgery to contact our office to arrange for a follow-up visit, and he verbalized understanding.   Per office protocol, patient can hold Eliquis  for 2 days prior to procedure.   Patient David not need bridging with Lovenox (enoxaparin) around procedure.   A copy of this note David be routed to requesting surgeon.  Time:   Today, I have spent 6 minutes with the patient with telehealth technology discussing medical history, symptoms, and management plan.     Wyn Raddle, Jackee Shove, NP  03/06/2024, 7:57 AM

## 2024-03-07 ENCOUNTER — Encounter: Payer: Self-pay | Admitting: Internal Medicine

## 2024-03-07 NOTE — Telephone Encounter (Signed)
 Cardiology clearance received. Pt informed via VM to hold Eliquis  2 days prior to procedure.

## 2024-03-10 NOTE — Telephone Encounter (Signed)
 Pt informed via VM to hold Eliquis  2 days prior to procedure.

## 2024-03-13 NOTE — Telephone Encounter (Signed)
 Portal msg sent about eliquis  hold.

## 2024-03-14 ENCOUNTER — Telehealth: Payer: Self-pay | Admitting: Gastroenterology

## 2024-03-14 MED ORDER — NA SULFATE-K SULFATE-MG SULF 17.5-3.13-1.6 GM/177ML PO SOLN: 1.0000 | Freq: Once | ORAL | 0 refills | Status: AC

## 2024-03-14 NOTE — Telephone Encounter (Signed)
 Inbound call from patient stating that he does not have his prep medication. Patient stated that he would like his prep to go to the [facility] at [employer]. Please advise.

## 2024-03-14 NOTE — Telephone Encounter (Signed)
 Prep sent to local pharmacy and pt informed.

## 2024-03-20 ENCOUNTER — Encounter: Payer: Self-pay | Admitting: Gastroenterology

## 2024-03-20 ENCOUNTER — Ambulatory Visit: Admitting: Gastroenterology

## 2024-03-20 VITALS — BP 159/73 | HR 66 | Temp 97.3°F | Resp 12 | Ht 73.0 in | Wt 247.0 lb

## 2024-03-20 DIAGNOSIS — K2950 Unspecified chronic gastritis without bleeding: Secondary | ICD-10-CM | POA: Diagnosis not present

## 2024-03-20 DIAGNOSIS — R1310 Dysphagia, unspecified: Secondary | ICD-10-CM | POA: Diagnosis not present

## 2024-03-20 DIAGNOSIS — Z1211 Encounter for screening for malignant neoplasm of colon: Secondary | ICD-10-CM

## 2024-03-20 DIAGNOSIS — K648 Other hemorrhoids: Secondary | ICD-10-CM | POA: Diagnosis not present

## 2024-03-20 DIAGNOSIS — K5909 Other constipation: Secondary | ICD-10-CM | POA: Diagnosis not present

## 2024-03-20 DIAGNOSIS — D123 Benign neoplasm of transverse colon: Secondary | ICD-10-CM | POA: Diagnosis not present

## 2024-03-20 DIAGNOSIS — B9681 Helicobacter pylori [H. pylori] as the cause of diseases classified elsewhere: Secondary | ICD-10-CM | POA: Diagnosis not present

## 2024-03-20 DIAGNOSIS — F419 Anxiety disorder, unspecified: Secondary | ICD-10-CM | POA: Diagnosis not present

## 2024-03-20 DIAGNOSIS — F32A Depression, unspecified: Secondary | ICD-10-CM | POA: Diagnosis not present

## 2024-03-20 DIAGNOSIS — Y842 Radiological procedure and radiotherapy as the cause of abnormal reaction of the patient, or of later complication, without mention of misadventure at the time of the procedure: Secondary | ICD-10-CM

## 2024-03-20 DIAGNOSIS — I480 Paroxysmal atrial fibrillation: Secondary | ICD-10-CM | POA: Diagnosis not present

## 2024-03-20 DIAGNOSIS — K5730 Diverticulosis of large intestine without perforation or abscess without bleeding: Secondary | ICD-10-CM

## 2024-03-20 DIAGNOSIS — R1319 Other dysphagia: Secondary | ICD-10-CM

## 2024-03-20 DIAGNOSIS — D125 Benign neoplasm of sigmoid colon: Secondary | ICD-10-CM

## 2024-03-20 DIAGNOSIS — Z860101 Personal history of adenomatous and serrated colon polyps: Secondary | ICD-10-CM

## 2024-03-20 DIAGNOSIS — K627 Radiation proctitis: Secondary | ICD-10-CM | POA: Diagnosis not present

## 2024-03-20 DIAGNOSIS — I10 Essential (primary) hypertension: Secondary | ICD-10-CM | POA: Diagnosis not present

## 2024-03-20 MED ORDER — SODIUM CHLORIDE 0.9 % IV SOLN: 500.0000 mL | Freq: Once | INTRAVENOUS | Status: DC

## 2024-03-20 NOTE — Patient Instructions (Addendum)
 Continue present medications. Resume Eliquis  (apixaban ) at prior dose tomorrow. Await pathology results. Repeat colonoscopy is recommended for surveillance. The colonoscopy date will be determined after pathology results from today's exam become available for review.  Begin taking over-the-counter omeprazole 20 mg, 1 tablet daily for 6 weeks Minimize use of NSAIDs (Advil, Ibuprofen, Aleve, Motrin) if currently taking any.  Please read over handouts provided  YOU HAD AN ENDOSCOPIC PROCEDURE TODAY AT THE [facility]:   Refer to the procedure report that was given to you for any specific questions about what was found during the examination.  If the procedure report does not answer your questions, please call your gastroenterologist to clarify.  If you requested that your care partner not be given the details of your procedure findings, then the procedure report has been included in a sealed envelope for you to review at your convenience later.  YOU SHOULD EXPECT: Some feelings of bloating in the abdomen. Passage of more gas than usual.  Walking can help get rid of the air that was put into your GI tract during the procedure and reduce the bloating. If you had a lower endoscopy (such as a colonoscopy or flexible sigmoidoscopy) you may notice spotting of blood in your stool or on the toilet paper. If you underwent a bowel prep for your procedure, you may not have a normal bowel movement for a few days.  Please Note:  You might notice some irritation and congestion in your nose or some drainage.  This is from the oxygen used during your procedure.  There is no need for concern and it should clear up in a day or so.  SYMPTOMS TO REPORT IMMEDIATELY:  Following lower endoscopy (colonoscopy or flexible sigmoidoscopy):  Excessive amounts of blood in the stool  Significant tenderness or worsening of abdominal pains  Swelling of the abdomen that is new, acute  Fever of 100F or  higher  Following upper endoscopy (EGD)  Vomiting of blood or coffee ground material  New chest pain or pain under the shoulder blades  Painful or persistently difficult swallowing  New shortness of breath  Fever of 100F or higher  Black, tarry-looking stools  For urgent or emergent issues, a gastroenterologist can be reached at any hour by calling [phone]. Do not use MyChart messaging for urgent concerns.    DIET:  We do recommend a small meal at first, but then you may proceed to your regular diet.  Drink plenty of fluids but you should avoid alcoholic beverages for 24 hours.  ACTIVITY:  You should plan to take it easy for the rest of today and you should NOT DRIVE or use heavy machinery until tomorrow (because of the sedation medicines used during the test).    FOLLOW UP: Our staff will call the number listed on your records the next business day following your procedure.  We will call around 7:15- 8:00 am to check on you and address any questions or concerns that you may have regarding the information given to you following your procedure. If we do not reach you, we will leave a message.     If any biopsies were taken you will be contacted by phone or by letter within the next 1-3 weeks.  Please call us  at [phone] if you have not heard about the biopsies in 3 weeks.    SIGNATURES/CONFIDENTIALITY: You and/or your care partner have signed paperwork which will be entered into your electronic medical record.  These signatures  attest to the fact that that the information above on your After Visit Summary has been reviewed and is understood.  Full responsibility of the confidentiality of this discharge information lies with you and/or your care-partner.

## 2024-03-20 NOTE — Progress Notes (Signed)
 Sedate, gd SR, tolerated procedure well, VSS, report to RN

## 2024-03-20 NOTE — Progress Notes (Signed)
 History and Physical:  This patient presents for endoscopic testing for: Encounter Diagnoses  Name Primary?   Chronic constipation Yes   Esophageal dysphagia     68 year old man here today for endoscopic evaluation of GI symptoms that are outlined in my 02/12/2024 office note with no significant changes since then.  He has had a change in bowel habits with development of constipation as well as intermittent esophageal dysphagia. Last colonoscopy with Dr. Aneita in February 2017 removed 2 subcentimeter tubular adenomas and mild radiation proctitis was seen.  This patient was advised to hold his Eliquis  2 days prior to this procedure after his recent cardiology consultation, and we have just learned that he has held it for about the last 3 weeks without informing his cardiologist of this.  Patient is otherwise without complaints or active issues today.   Past Medical History: Past Medical History:  Diagnosis Date   Anal fissure    Anemia    Post radiation, bloody urine when he drinks/eats acidic things   Anxiety    Chronic lower back pain 06/11/2012   daily a problem Pain level 7, Tylenol  helpful(down to level 2)   Colon polyps    Depression    Due to med for his cancer - Lupron    Dysrhythmia    A. Fib   H/O cardiovascular stress test    non ischemic EF 55% 05-15-2007   Heart murmur    dx as a teen, Stress ECHO 2008 (-)    Hypertension    Hypertrophic obstructive cardiomyopathy (HOCM) (HCC)    PAF (paroxysmal atrial fibrillation) (HCC)    Prostate cancer metastatic to intrapelvic lymph node (HCC)    gleason 4+3=7, volume 56.4 cc, PSA 34.25     Past Surgical History: Past Surgical History:  Procedure Laterality Date   ACHILLES TENDON REPAIR     26 yrs ago, right foot   ANAL FISSURECTOMY  2008   ANTERIOR CERVICAL DECOMP/DISCECTOMY FUSION N/A 01/05/2021   Procedure: Cervical Three Corpectomy;  Surgeon: Gillie Duncans, MD;  Location: [facility] OR;  Service: Neurosurgery;  Laterality:  N/A;   COLONOSCOPY     COLONOSCOPY W/ POLYPECTOMY  2017   dental procedure  06/11/2012   minor -office procedure to correct jaw issue-used anesthesia gas to do   EYE SURGERY Left 2004   piece of small metal removed from eye   LYMPHADENECTOMY  06/17/2012   Procedure: LYMPHADENECTOMY;  Surgeon: Noretta Ferrara, MD;  Location: [facility];  Service: Urology;  Laterality: Bilateral;   POSTERIOR CERVICAL LAMINECTOMY N/A 09/27/2022   Procedure: Posterior Cervical Laminectomy - Cervical Three - Cervical Four;  Surgeon: Gillie Duncans, MD;  Location: [facility] OR;  Service: Neurosurgery;  Laterality: N/A;   PROSTATE BIOPSY     ROBOT ASSISTED LAPAROSCOPIC RADICAL PROSTATECTOMY  06/17/2012   Procedure: ROBOTIC ASSISTED LAPAROSCOPIC RADICAL PROSTATECTOMY LEVEL 2;  Surgeon: Noretta Ferrara, MD;  Location: [facility];  Service: Urology;  Laterality: N/A;       Allergies: No Known Allergies  Outpatient Meds: Current Outpatient Medications  Medication Sig Dispense Refill   diltiazem  (CARTIA  XT) 240 MG 24 hr capsule Take 1 capsule (240 mg total) by mouth daily. Pt needs to schedule appt with provider for further refills 90 capsule 3   Multiple Vitamin (MULTIVITAMIN WITH MINERALS) TABS tablet Take 1 tablet by mouth daily.     rosuvastatin  (CRESTOR ) 20 MG tablet Take 1 tablet (20 mg total) by mouth daily. 90 tablet 3   ALPRAZolam  (XANAX ) 0.25 MG tablet Take  1-2 tablets (0.25-0.5 mg total) by mouth 2 (two) times daily as needed for anxiety. 20 tablet 0   apixaban  (ELIQUIS ) 5 MG TABS tablet Take 1 tablet (5 mg total) by mouth 2 (two) times daily. 90 tablet 3   meclizine  (ANTIVERT ) 25 MG tablet TAKE 1 TABLET BY MOUTH THREE TIMES DAILY AS NEEDED FOR DIZZINESS 60 tablet 2   methocarbamol (ROBAXIN) 500 MG tablet Take 500 mg by mouth every 8 (eight) hours as needed.     tiZANidine  (ZANAFLEX ) 4 MG tablet Take 1 tablet (4 mg total) by mouth every 6 (six) hours as needed for muscle spasms. 60 tablet 0   Current Facility-Administered  Medications  Medication Dose Route Frequency Provider Last Rate Last Admin   0.9 %  sodium chloride  infusion  500 mL Intravenous Once Danis, Rainer Mounce L III, MD          ___________________________________________________________________ Objective   Exam:  BP (!) 180/92   Pulse 61   Temp (!) 97.3 F (36.3 C)   Ht 6' 1 (1.854 m)   Wt 247 lb (112 kg)   SpO2 98%   BMI 32.59 kg/m  .  Normal sinus rhythm on monitor CV: regular , S1/S2 Resp: clear to auscultation bilaterally, normal RR and effort noted GI: soft, no tenderness, with active bowel sounds.   Assessment: Encounter Diagnoses  Name Primary?   Chronic constipation Yes   Esophageal dysphagia      Plan: Colonoscopy EGD with possible dilation  The benefits and risks of the planned procedure(s) were described in detail with the patient or (when appropriate) their health care proxy.  Risks were outlined as including, but not limited to, bleeding, infection, perforation, adverse medication reaction leading to cardiac or pulmonary decompensation, pancreatitis (if ERCP).  The limitation of incomplete mucosal visualization was also discussed.  No guarantees or warranties were given.  The patient is appropriate for an endoscopic procedure in the ambulatory setting.   - Victory Brand, MD

## 2024-03-20 NOTE — Op Note (Signed)
 [facility] Patient Name: David Smith Procedure Date: 03/20/2024 1:19 PM MRN: [id number] Endoscopist: Victory L. Legrand , MD, [id number] Age: 68 Referring MD:  Date of Birth: [birth year] Gender: Male Account #: [id number] Procedure:                Colonoscopy Indications:              Surveillance: Personal history of adenomatous                            polyps on last colonoscopy > 5 years ago, ,                            Incidental change in bowel habits noted                           2 subcentimeter tubular adenomas in 2017 (Dr. Aneita) Medicines:                Monitored Anesthesia Care Procedure:                Pre-Anesthesia Assessment:                           - Prior to the procedure, a History and Physical                            was performed, and patient medications and                            allergies were reviewed. The patient's tolerance of                            previous anesthesia was also reviewed. The risks                            and benefits of the procedure and the sedation                            options and risks were discussed with the patient.                            All questions were answered, and informed consent                            was obtained. Prior Anticoagulants: The patient has                            taken Eliquis  (apixaban ), last dose was 3 weeks                            prior to procedure (patient decision-see H&P). ASA                            Grade Assessment: III - A patient with severe  systemic disease. After reviewing the risks and                            benefits, the patient was deemed in satisfactory                            condition to undergo the procedure.                           After obtaining informed consent, the colonoscope                            was passed under direct vision. Throughout the                            procedure, the patient's blood  pressure, pulse, and                            oxygen saturations were monitored continuously. The                            CF [id number] #[id number] was introduced through the anus                            and advanced to the the cecum, identified by                            appendiceal orifice and ileocecal valve. The                            colonoscopy was somewhat difficult due to a                            redundant colon. Successful completion of the                            procedure was aided by straightening and shortening                            the scope to obtain bowel loop reduction. The                            patient tolerated the procedure well. The quality                            of the bowel preparation was good. The ileocecal                            valve, appendiceal orifice, and rectum were                            photographed. Scope In: 1:27:33 PM Scope Out: 1:44:00 PM Scope Withdrawal Time: 0 hours 11 minutes 52 seconds  Total Procedure Duration: 0 hours 16  minutes 27 seconds  Findings:                 The perianal and digital rectal examinations were                            normal.                           Repeat examination of right colon under NBI                            performed.                           Two sessile polyps were found in the proximal                            sigmoid colon and transverse colon. The polyps were                            diminutive in size. These polyps were removed with                            a cold snare. Resection and retrieval were complete.                           Diverticula were found in the left colon. Haustral                            thickening and mild associated tortuosity.                           Internal hemorrhoids were found. Most distal rectal                            mucosa just proximal to hemorrhoidal plexus had                            mild nonfriable radiation  proctitis.                           Retroflexion in the rectum was not performed due to                            narrow anatomy.                           The exam was otherwise without abnormality. Complications:            No immediate complications. Estimated Blood Loss:     Estimated blood loss was minimal. Impression:               - Two diminutive polyps in the proximal sigmoid                            colon and in the transverse colon,  removed with a                            cold snare. Resected and retrieved.                           - Diverticulosis in the left colon.                           - Internal hemorrhoids.                           - The examination was otherwise normal. Recommendation:           - Patient has a contact number available for                            emergencies. The signs and symptoms of potential                            delayed complications were discussed with the                            patient. Return to normal activities tomorrow.                            Written discharge instructions were provided to the                            patient.                           - Resume previous diet.                           - Continue present medications.                           - Resume Eliquis  (apixaban ) at prior dose tomorrow.                           - Await pathology results.                           - Repeat colonoscopy is recommended for                            surveillance. The colonoscopy date will be                            determined after pathology results from today's                            exam become available for review.                           - See the other procedure note for documentation of  additional recommendations. Ly Wass L. Legrand, MD 03/20/2024 1:50:05 PM This report has been signed electronically.

## 2024-03-20 NOTE — Op Note (Signed)
 [facility] Patient Name: David Smith Procedure Date: 03/20/2024 1:18 PM MRN: [id number] Endoscopist: Victory L. Legrand , MD, [id number] Age: 68 Referring MD:  Date of Birth: 07-26-1955 Gender: Male Account #: [id number] Procedure:                Upper GI endoscopy Indications:              Esophageal dysphagia Medicines:                Monitored Anesthesia Care Procedure:                Pre-Anesthesia Assessment:                           - Prior to the procedure, a History and Physical                            was performed, and patient medications and                            allergies were reviewed. The patient's tolerance of                            previous anesthesia was also reviewed. The risks                            and benefits of the procedure and the sedation                            options and risks were discussed with the patient.                            All questions were answered, and informed consent                            was obtained. Prior Anticoagulants: The patient has                            taken Eliquis  (apixaban ), last dose was 3 weeks                            prior to procedure (patient decision-see H&P). ASA                            Grade Assessment: III - A patient with severe                            systemic disease. After reviewing the risks and                            benefits, the patient was deemed in satisfactory                            condition to undergo the procedure.  After obtaining informed consent, the endoscope was                            passed under direct vision. Throughout the                            procedure, the patient's blood pressure, pulse, and                            oxygen saturations were monitored continuously. The                            Endoscope was introduced through the mouth, and                            advanced to the second part of  duodenum. The upper                            GI endoscopy was accomplished without difficulty.                            The patient tolerated the procedure well. Scope In: Scope Out: Findings:                 The larynx was normal.                           The lower third of the esophagus was mildly                            tortuous.                           The exam of the esophagus was otherwise normal. No                            dilatation, mucosal abnormality, stricture, mass or                            resistance passing the scope through the EGJ.                           Patchy mild inflammation characterized by adherent                            blood, congestion (edema) and erythema was found in                            the entire examined stomach. Biopsies were taken                            with a cold forceps for histology. (Antrum and body                            in same pathology  jar to rule out H. pylori)                           The exam of the stomach was otherwise normal,                            including on retroflexion. Distended well with                            insufflation. No retained food or fluid in the                            stomach.                           Few non-bleeding linear and superficial duodenal                            ulcers with no stigmata of bleeding were found in                            the duodenal bulb.                           The exam of the duodenum was otherwise normal. Complications:            No immediate complications. Estimated Blood Loss:     Estimated blood loss was minimal. Impression:               - Normal larynx.                           - Tortuous esophagus.                           - Gastritis. Biopsied.                           - Non-bleeding duodenal ulcers with no stigmata of                            bleeding.                           Dysphagia likely related to mild distal  esophageal                            tortuosity and dysmotility/presbyesophagus. No                            endoscopic findings suggestive of achalasia. Recommendation:           - Patient has a contact number available for                            emergencies. The signs and symptoms of potential  delayed complications were discussed with the                            patient. Return to normal activities tomorrow.                            Written discharge instructions were provided to the                            patient.                           - Resume previous diet.                           - Resume Eliquis  (apixaban ) at prior dose tomorrow.                           - Await pathology results.                           - See the other procedure note for documentation of                            additional recommendations.                           - Begin taking over-the-counter omeprazole 20 mg, 1                            tablet daily for 6 weeks                           -Minimize use of NSAIDs if currently taking any. Amyri Frenz L. Legrand, MD 03/20/2024 2:10:42 PM This report has been signed electronically.

## 2024-03-20 NOTE — Progress Notes (Signed)
 Called to room to assist during endoscopic procedure.  Patient ID and intended procedure confirmed with present staff. Received instructions for my participation in the procedure from the performing physician.

## 2024-03-21 ENCOUNTER — Telehealth: Payer: Self-pay

## 2024-03-21 NOTE — Telephone Encounter (Signed)
  Follow up Call-     03/20/2024   12:52 PM  Call back number  Post procedure Call Back phone  # [phone]  Permission to leave phone message Yes     Patient questions:  Do you have a fever, pain , or abdominal swelling? No. Pain Score  0 *  Have you tolerated food without any problems? Yes.    Have you been able to return to your normal activities? Yes.    Do you have any questions about your discharge instructions: Diet   No. Medications  No. Follow up visit  No.  Do you have questions or concerns about your Care? No.  Actions: * If pain score is 4 or above: No action needed, pain <4.

## 2024-03-25 ENCOUNTER — Ambulatory Visit: Payer: Self-pay | Admitting: Gastroenterology

## 2024-03-25 LAB — SURGICAL PATHOLOGY

## 2024-03-25 NOTE — Telephone Encounter (Signed)
 Patient returning call.

## 2024-03-26 MED ORDER — METRONIDAZOLE 250 MG PO TABS: 250.0000 mg | ORAL_TABLET | Freq: Four times a day (QID) | ORAL | 0 refills | Status: AC

## 2024-03-26 MED ORDER — BISMUTH SUBSALICYLATE 262 MG PO TABS: 2.0000 | ORAL_TABLET | Freq: Four times a day (QID) | ORAL | 0 refills | Status: AC

## 2024-03-26 MED ORDER — TETRACYCLINE HCL 500 MG PO CAPS: 500.0000 mg | ORAL_CAPSULE | Freq: Four times a day (QID) | ORAL | 0 refills | Status: AC

## 2024-04-04 ENCOUNTER — Other Ambulatory Visit: Payer: Self-pay

## 2024-04-04 ENCOUNTER — Emergency Department (HOSPITAL_COMMUNITY)

## 2024-04-04 ENCOUNTER — Emergency Department (HOSPITAL_COMMUNITY): Admission: EM | Admit: 2024-04-04 | Discharge: 2024-04-04 | Disposition: A | Discharge status: Home / Self Care

## 2024-04-04 ENCOUNTER — Encounter (HOSPITAL_COMMUNITY): Payer: Self-pay | Admitting: Emergency Medicine

## 2024-04-04 DIAGNOSIS — M5459 Other low back pain: Secondary | ICD-10-CM | POA: Diagnosis not present

## 2024-04-04 DIAGNOSIS — I7121 Aneurysm of the ascending aorta, without rupture: Secondary | ICD-10-CM | POA: Diagnosis not present

## 2024-04-04 DIAGNOSIS — I10 Essential (primary) hypertension: Secondary | ICD-10-CM | POA: Diagnosis not present

## 2024-04-04 DIAGNOSIS — Z8546 Personal history of malignant neoplasm of prostate: Secondary | ICD-10-CM | POA: Diagnosis not present

## 2024-04-04 DIAGNOSIS — I3139 Other pericardial effusion (noninflammatory): Secondary | ICD-10-CM | POA: Diagnosis not present

## 2024-04-04 DIAGNOSIS — C61 Malignant neoplasm of prostate: Secondary | ICD-10-CM | POA: Diagnosis not present

## 2024-04-04 DIAGNOSIS — I700 Atherosclerosis of aorta: Secondary | ICD-10-CM | POA: Diagnosis not present

## 2024-04-04 DIAGNOSIS — Z7901 Long term (current) use of anticoagulants: Secondary | ICD-10-CM | POA: Insufficient documentation

## 2024-04-04 DIAGNOSIS — M545 Low back pain, unspecified: Secondary | ICD-10-CM | POA: Insufficient documentation

## 2024-04-04 DIAGNOSIS — M47816 Spondylosis without myelopathy or radiculopathy, lumbar region: Secondary | ICD-10-CM | POA: Diagnosis not present

## 2024-04-04 DIAGNOSIS — I482 Chronic atrial fibrillation, unspecified: Secondary | ICD-10-CM | POA: Insufficient documentation

## 2024-04-04 DIAGNOSIS — M47814 Spondylosis without myelopathy or radiculopathy, thoracic region: Secondary | ICD-10-CM | POA: Diagnosis not present

## 2024-04-04 DIAGNOSIS — R10A2 Flank pain, left side: Secondary | ICD-10-CM | POA: Insufficient documentation

## 2024-04-04 DIAGNOSIS — R932 Abnormal findings on diagnostic imaging of liver and biliary tract: Secondary | ICD-10-CM | POA: Diagnosis not present

## 2024-04-04 LAB — URINALYSIS, ROUTINE W REFLEX MICROSCOPIC
Bacteria, UA: NONE SEEN
Bilirubin Urine: NEGATIVE
Glucose, UA: NEGATIVE mg/dL
Hgb urine dipstick: NEGATIVE
Ketones, ur: NEGATIVE mg/dL
Leukocytes,Ua: NEGATIVE
Nitrite: NEGATIVE
Protein, ur: NEGATIVE mg/dL
Specific Gravity, Urine: 1.024 (ref 1.005–1.030)
pH: 6 (ref 5.0–8.0)

## 2024-04-04 LAB — CBC
HCT: 39.7 % (ref 39.0–52.0)
Hemoglobin: 12.3 g/dL — ABNORMAL LOW (ref 13.0–17.0)
MCH: 26.5 pg (ref 26.0–34.0)
MCHC: 31 g/dL (ref 30.0–36.0)
MCV: 85.4 fL (ref 80.0–100.0)
Platelets: 130 K/uL — ABNORMAL LOW (ref 150–400)
RBC: 4.65 MIL/uL (ref 4.22–5.81)
RDW: 14.2 % (ref 11.5–15.5)
WBC: 3.9 K/uL — ABNORMAL LOW (ref 4.0–10.5)
nRBC: 0 % (ref 0.0–0.2)

## 2024-04-04 LAB — BASIC METABOLIC PANEL WITH GFR
Anion gap: 10 (ref 5–15)
BUN: 17 mg/dL (ref 8–23)
CO2: 25 mmol/L (ref 22–32)
Calcium: 9.3 mg/dL (ref 8.9–10.3)
Chloride: 109 mmol/L (ref 98–111)
Creatinine, Ser: 1.15 mg/dL (ref 0.61–1.24)
GFR, Estimated: >60 mL/min
Glucose, Bld: 95 mg/dL (ref 70–99)
Potassium: 4.2 mmol/L (ref 3.5–5.1)
Sodium: 143 mmol/L (ref 135–145)

## 2024-04-04 MED ORDER — METHOCARBAMOL 500 MG PO TABS: 500.0000 mg | ORAL_TABLET | Freq: Two times a day (BID) | ORAL | 0 refills | Status: DC

## 2024-04-04 MED ORDER — IOHEXOL 350 MG/ML SOLN
100.0000 mL | Freq: Once | INTRAVENOUS | Status: AC | PRN
  Administered 2024-04-04: 100 mL via INTRAVENOUS

## 2024-04-04 MED ORDER — LIDOCAINE 5 % EX PTCH
1.0000 | MEDICATED_PATCH | Freq: Once | CUTANEOUS | Status: DC
  Administered 2024-04-04: 1 via TRANSDERMAL
  Filled 2024-04-04: qty 1

## 2024-04-04 MED ORDER — ACETAMINOPHEN 325 MG PO TABS
650.0000 mg | ORAL_TABLET | Freq: Once | ORAL | Status: AC
  Administered 2024-04-04: 650 mg via ORAL
  Filled 2024-04-04: qty 2

## 2024-04-04 MED ORDER — LIDOCAINE 5 % EX PTCH: 1.0000 | MEDICATED_PATCH | CUTANEOUS | 0 refills | Status: AC

## 2024-04-04 NOTE — ED Notes (Signed)
 Patient transported to CT

## 2024-04-04 NOTE — ED Provider Notes (Signed)
 [name] AT [facility] Provider Note   CSN: [id number] Arrival date & time: 04/04/24  9048     Patient presents with: Back Pain and Flank Pain   David Smith is a 68 y.o. male.   This is a 68 year old male presenting emergency department with left flank and low back pain.  Reports history of prostate cancer, has had issues with his low back for 14 years or so but not as bad as symptoms have been progressively worsening in the past month.  Pain described as sharp, radiates from left flank down into abdomen.  It does seem to be exacerbated by movement.  Better with rest.  Reports priors cervical fusion with some residual weakness to his left upper extremity and lower extremity.  That is unchanged.  No bowel or bladder incontinence.  No saddle anesthesia.  Not on immunosuppressive medications.   Back Pain Flank Pain       Prior to Admission medications   Medication Sig Start Date End Date Taking? Authorizing Provider  lidocaine  (LIDODERM ) 5 % Place 1 patch onto the skin daily. Remove & Discard patch within 12 hours or as directed by MD 04/04/24  Yes Neysa Caron PARAS, DO  methocarbamol (ROBAXIN) 500 MG tablet Take 1 tablet (500 mg total) by mouth 2 (two) times daily. 04/04/24  Yes Neysa Caron PARAS, DO  ALPRAZolam  (XANAX ) 0.25 MG tablet Take 1-2 tablets (0.25-0.5 mg total) by mouth 2 (two) times daily as needed for anxiety. 11/22/21   [name], MD  apixaban  (ELIQUIS ) 5 MG TABS tablet Take 1 tablet (5 mg total) by mouth 2 (two) times daily. 06/22/23   O'Neal, Darryle Ned, MD  Bismuth Subsalicylate 262 MG TABS Take 2 tablets (524 mg total) by mouth in the morning, at noon, in the evening, and at bedtime for 14 days. 03/26/24 04/09/24  Legrand Victory LITTIE DOUGLAS, MD  diltiazem  (CARTIA  XT) 240 MG 24 hr capsule Take 1 capsule (240 mg total) by mouth daily. Pt needs to schedule appt with provider for further refills 06/22/23   O'Neal, Darryle Ned, MD  meclizine   (ANTIVERT ) 25 MG tablet TAKE 1 TABLET BY MOUTH THREE TIMES DAILY AS NEEDED FOR DIZZINESS 10/09/23   O'Neal, Darryle Ned, MD  metroNIDAZOLE (FLAGYL) 250 MG tablet Take 1 tablet (250 mg total) by mouth 4 (four) times daily for 14 days. 03/26/24 04/09/24  Legrand Victory LITTIE DOUGLAS, MD  Multiple Vitamin (MULTIVITAMIN WITH MINERALS) TABS tablet Take 1 tablet by mouth daily.    [provider]  rosuvastatin  (CRESTOR ) 20 MG tablet Take 1 tablet (20 mg total) by mouth daily. 06/22/23   O'Neal, Darryle Ned, MD  tetracycline (SUMYCIN) 500 MG capsule Take 1 capsule (500 mg total) by mouth 4 (four) times daily for 14 days. 03/26/24 04/09/24  Legrand Victory LITTIE DOUGLAS, MD  tiZANidine  (ZANAFLEX ) 4 MG tablet Take 1 tablet (4 mg total) by mouth every 6 (six) hours as needed for muscle spasms. 01/07/21   Cabbell, Kyle, MD    Allergies: Patient has no known allergies.    Review of Systems  Genitourinary:  Positive for flank pain.  Musculoskeletal:  Positive for back pain.    Updated Vital Signs BP (!) 160/103 (BP Location: Left Arm)   Pulse 61   Temp 97.9 F (36.6 C) (Oral)   Resp 17   SpO2 99%   Physical Exam Vitals and nursing note reviewed.  Constitutional:      General: He is not in  acute distress.    Appearance: He is not toxic-appearing.  HENT:     Head: Normocephalic.     Nose: Nose normal.     Mouth/Throat:     Mouth: Mucous membranes are moist.  Eyes:     Conjunctiva/sclera: Conjunctivae normal.  Cardiovascular:     Rate and Rhythm: Normal rate and regular rhythm.  Pulmonary:     Effort: Pulmonary effort is normal.     Breath sounds: Normal breath sounds.  Abdominal:     General: Abdomen is flat. There is no distension.     Tenderness: There is no abdominal tenderness. There is no guarding or rebound.  Musculoskeletal:     Comments: Noticed midline spinal tenderness.  5 out of 5 plantarflexion dorsiflexion, hip extension.  Normal sensation.  Equal pulses in all extremities.  Skin:     Capillary Refill: Capillary refill takes less than 2 seconds.  Neurological:     Mental Status: He is alert and oriented to person, place, and time.  Psychiatric:        Mood and Affect: Mood normal.        Behavior: Behavior normal.     (all labs ordered are listed, but only abnormal results are displayed) Labs Reviewed  CBC - Abnormal; Notable for the following components:      Result Value   WBC 3.9 (*)    Hemoglobin 12.3 (*)    Platelets 130 (*)    All other components within normal limits  URINALYSIS, ROUTINE W REFLEX MICROSCOPIC  BASIC METABOLIC PANEL WITH GFR    EKG: None  Radiology: CT T-SPINE NO CHARGE Result Date: 04/04/2024 CLINICAL DATA:  Acute aortic syndrome suspected, aortic aneurysm, history of prostate cancer EXAM: CT Thoracic and Lumbar spine with contrast TECHNIQUE: Multiplanar CT images of the thoracic and lumbar spine were reconstructed from contemporary CT of the Chest, Abdomen, and Pelvis. RADIATION DOSE REDUCTION: This exam was performed according to the departmental dose-optimization program which includes automated exposure control, adjustment of the mA and/or kV according to patient size and/or use of iterative reconstruction technique. CONTRAST:  No additional COMPARISON:  07/24/2022, 04/04/2024 FINDINGS: CT THORACIC SPINE FINDINGS Alignment: Mild left convex curvature of the upper thoracic spine. Otherwise alignment is anatomic. Vertebrae: No acute fracture or focal pathologic process. Paraspinal and other soft tissues: The paraspinal soft tissues are unremarkable. Please refer to separately reported CT angiography of the chest for detailed findings in that region. Disc levels: There is mild diffuse thoracic spondylosis with prominent bridging anterior osteophytes from T5 through T10. There is no significant bony encroachment upon the central canal or neural foramina. CT LUMBAR SPINE FINDINGS Segmentation: 5 lumbar type vertebrae. Alignment: Normal. Vertebrae:  No acute fracture or focal pathologic process. Paraspinal and other soft tissues: The paraspinal soft tissues are unremarkable. Please refer to separately reported CT abdomen and pelvis exam for findings in those regions. Disc levels: Findings at individual levels are as follows: L1-2: Mild broad-based disc bulge and bilateral facet hypertrophy. L2-3: Broad-based disc bulge and bilateral facet hypertrophy contributing to mild trefoil central canal stenosis. L3-4: Circumferential disc bulge with bilateral facet and ligamentum flavum hypertrophy contribute to moderate central canal stenosis and left greater than right neural foraminal narrowing. L4-5: Circumferential disc bulge and bilateral facet hypertrophy result in severe central canal stenosis and significant bilateral symmetrical neural foraminal encroachment. L5-S1: Mild circumferential disc bulge bilateral facet hypertrophy, with mild symmetrical bilateral neural foraminal encroachment. Reconstructed images demonstrate no additional findings. IMPRESSION: 1. No  acute fracture of the thoracolumbar spine. 2. Mild multilevel thoracic spondylosis, greatest from T5 through T10. No significant bony encroachment upon the central canal or neural foramina. 3. Multilevel lumbar spondylosis and facet hypertrophy, with significant central canal stenosis and neural foraminal encroachment greatest from L2-3 through L4-5. Electronically Signed   By: Ozell Daring M.D.   On: 04/04/2024 14:36   CT L-SPINE NO CHARGE Result Date: 04/04/2024 CLINICAL DATA:  Acute aortic syndrome suspected, aortic aneurysm, history of prostate cancer EXAM: CT Thoracic and Lumbar spine with contrast TECHNIQUE: Multiplanar CT images of the thoracic and lumbar spine were reconstructed from contemporary CT of the Chest, Abdomen, and Pelvis. RADIATION DOSE REDUCTION: This exam was performed according to the departmental dose-optimization program which includes automated exposure control, adjustment  of the mA and/or kV according to patient size and/or use of iterative reconstruction technique. CONTRAST:  No additional COMPARISON:  07/24/2022, 04/04/2024 FINDINGS: CT THORACIC SPINE FINDINGS Alignment: Mild left convex curvature of the upper thoracic spine. Otherwise alignment is anatomic. Vertebrae: No acute fracture or focal pathologic process. Paraspinal and other soft tissues: The paraspinal soft tissues are unremarkable. Please refer to separately reported CT angiography of the chest for detailed findings in that region. Disc levels: There is mild diffuse thoracic spondylosis with prominent bridging anterior osteophytes from T5 through T10. There is no significant bony encroachment upon the central canal or neural foramina. CT LUMBAR SPINE FINDINGS Segmentation: 5 lumbar type vertebrae. Alignment: Normal. Vertebrae: No acute fracture or focal pathologic process. Paraspinal and other soft tissues: The paraspinal soft tissues are unremarkable. Please refer to separately reported CT abdomen and pelvis exam for findings in those regions. Disc levels: Findings at individual levels are as follows: L1-2: Mild broad-based disc bulge and bilateral facet hypertrophy. L2-3: Broad-based disc bulge and bilateral facet hypertrophy contributing to mild trefoil central canal stenosis. L3-4: Circumferential disc bulge with bilateral facet and ligamentum flavum hypertrophy contribute to moderate central canal stenosis and left greater than right neural foraminal narrowing. L4-5: Circumferential disc bulge and bilateral facet hypertrophy result in severe central canal stenosis and significant bilateral symmetrical neural foraminal encroachment. L5-S1: Mild circumferential disc bulge bilateral facet hypertrophy, with mild symmetrical bilateral neural foraminal encroachment. Reconstructed images demonstrate no additional findings. IMPRESSION: 1. No acute fracture of the thoracolumbar spine. 2. Mild multilevel thoracic  spondylosis, greatest from T5 through T10. No significant bony encroachment upon the central canal or neural foramina. 3. Multilevel lumbar spondylosis and facet hypertrophy, with significant central canal stenosis and neural foraminal encroachment greatest from L2-3 through L4-5. Electronically Signed   By: Ozell Daring M.D.   On: 04/04/2024 14:36   CT Angio Chest/Abd/Pel for Dissection W and/or Wo Contrast Result Date: 04/04/2024 EXAM: CTA CHEST, ABDOMEN AND PELVIS WITH AND WITHOUT CONTRAST 04/04/2024 12:14:50 PM TECHNIQUE: CTA of the chest was performed with and without the administration of intravenous contrast. CTA of the abdomen and pelvis was performed with and without the administration of intravenous contrast. 100 mL (iohexol (OMNIPAQUE) 350 MG/ML injection 100 mL IOHEXOL 350 MG/ML SOLN) was administered. Multiplanar reformatted images are provided for review. MIP images are provided for review. Automated exposure control, iterative reconstruction, and/or weight based adjustment of the mA/kV was utilized to reduce the radiation dose to as low as reasonably achievable. COMPARISON: PET CT 07/24/2022 and previous. CLINICAL HISTORY: Acute aortic syndrome (AAS) suspected. FINDINGS: VASCULATURE: AORTA: 4.3 cm ascending aortic aneurysm, stable by my measurement. Minimal calcified plaque in the infrarenal aorta without aneurysm. No dissection. PULMONARY ARTERIES:  No pulmonary embolism with the limits of this exam. GREAT VESSELS OF AORTIC ARCH: No acute finding. No dissection. No arterial occlusion or significant stenosis. CELIAC TRUNK: No acute finding. No occlusion or significant stenosis. SUPERIOR MESENTERIC ARTERY: No acute finding. No occlusion or significant stenosis. INFERIOR MESENTERIC ARTERY: No acute finding. No occlusion or significant stenosis. RENAL ARTERIES: No acute finding. No occlusion or significant stenosis. ILIAC ARTERIES: Minimal plaque in the right internal iliac artery. No occlusion or  significant stenosis. CHEST: MEDIASTINUM: Small pericardial effusion. No mediastinal lymphadenopathy. LUNGS AND PLEURA: The lungs are without acute process. No focal consolidation or pulmonary edema. No evidence of pleural effusion or pneumothorax. THORACIC BONES AND SOFT TISSUES: Mild bilateral shoulder degenerative joint disease (DJD). Vertebral endplate spurring at multiple levels in the lower thoracic spine. No acute soft tissue abnormality. ABDOMEN AND PELVIS: LIVER: Probable benign hemangioma in hepatic segment 4a, present since 10/02/2019. GALLBLADDER AND BILE DUCTS: Gallbladder is unremarkable. No biliary ductal dilatation. SPLEEN: The spleen is unremarkable. PANCREAS: The pancreas is unremarkable. ADRENAL GLANDS: Bilateral adrenal glands demonstrate no acute abnormality. KIDNEYS, URETERS AND BLADDER: Simple cyst 5.4 cm 10 HU right lower pole, present since 10/02/2019. No stones in the kidneys or ureters. No hydronephrosis. No perinephric or periureteral stranding. Urinary bladder is unremarkable. GI AND BOWEL: Small hiatal hernia. Normal appendix. Stomach and duodenal sweep demonstrate no acute abnormality. There is no bowel obstruction. No abnormal bowel wall thickening or distension. REPRODUCTIVE: Post prostatectomy. PERITONEUM AND RETROPERITONEUM: No ascites or free air. LYMPH NODES: No lymphadenopathy. ABDOMINAL BONES AND SOFT TISSUES: Mild spurring in the lumbar spine L1 - S1. Mild bilateral hip degenerative joint disease (DJD). Bilateral pelvic phleboliths. No acute soft tissue abnormality. IMPRESSION: 1. Stable 4.3 cm ascending aortic aneurysm. Recommend surveillance imaging every 12 months to monitor size and growth. 2. Small pericardial effusion. Consider clinical follow-up to assess for symptoms or hemodynamic significance. 3. Atherosclerosis involving the infrarenal aorta and right internal iliac artery, without aneurysm. Consider risk factor modification and routine clinical management.  Electronically signed by: Dayne Hassell MD 04/04/2024 12:46 PM EDT RP Workstation: [id number]     Procedures   Medications Ordered in the ED  lidocaine  (LIDODERM ) 5 % 1 patch (1 patch Transdermal Patch Applied 04/04/24 1121)  acetaminophen  (TYLENOL ) tablet 650 mg (650 mg Oral Given 04/04/24 1120)  iohexol (OMNIPAQUE) 350 MG/ML injection 100 mL (100 mLs Intravenous Contrast Given 04/04/24 1210)    Clinical Course as of 04/04/24 1647  Fri Apr 04, 2024  1244 CT Angio Chest/Abd/Pel for Dissection W and/or Wo Contrast Do not appreciate obvious aortic dissection.  Right renal cyst note his pain is seemingly more on the left side.   [TY]  1302 CT Angio Chest/Abd/Pel for Dissection W and/or Wo Contrast IMPRESSION: 1. Stable 4.3 cm ascending aortic aneurysm. Recommend surveillance imaging every 12 months to monitor size and growth. 2. Small pericardial effusion. Consider clinical follow-up to assess for symptoms or hemodynamic significance. 3. Atherosclerosis involving the infrarenal aorta and right internal iliac artery, without aneurysm. Consider risk factor modification and routine clinical management.   [TY]  1449 IMPRESSION: 1. No acute fracture of the thoracolumbar spine. 2. Mild multilevel thoracic spondylosis, greatest from T5 through T10. No significant bony encroachment upon the central canal or neural foramina. 3. Multilevel lumbar spondylosis and facet hypertrophy, with significant central canal stenosis and neural foraminal encroachment greatest from L2-3 through L4-5.   Electronically Signed   By: Ozell Daring M.D.   On: 04/04/2024 14:36   [TY]  1507 Patient continues to not have significant symptoms.  No metabolic derangements.  Minor anemia noted.  UA without evidence of infection.  CTA without dissection.  CT thoracic and lumbar with arthritic changes and some central canal stenosis.  Discussed supportive care and outpatient follow-up with neurosurgery.  Patient  voiced understanding given return precautions.  Stable for discharge. [TY]    Clinical Course User Index [TY] Neysa Caron PARAS, DO                                 Medical Decision Making 68 year old male presenting emergency department for back pain.  Does have a history of prostate cancer, hypertension, A-fib on Eliquis .  He is afebrile, he is hypertensive 175/89, nontachycardic.  Low suspicion for cord compression based off history and physical exam.  Given his hypertension, flank pain with radiation down to his abdomen concern for subacute/acute dissection, but his symptoms do sound musculoskeletal as it is exacerbated by movement.  Does have a history of prostate cancer.  Bony mets?  Pathologic fracture?  Will get CTA to evaluate aorta as well as thoracic and lumbar spine series.  Will get screening labs as well.  Treated with multimodal pain medications.  See ED course for further MDM and final disposition.  Amount and/or Complexity of Data Reviewed External Data Reviewed:     Details: He is on Eliquis .  It does appear that he has an outpatient CTA chest ordered as well that has not been performed Labs: ordered. Decision-making details documented in ED Course. Radiology: ordered and independent interpretation performed. Decision-making details documented in ED Course.  Risk OTC drugs. Prescription drug management. Decision regarding hospitalization. Diagnosis or treatment significantly limited by social determinants of health.       Final diagnoses:  Low back pain, unspecified back pain laterality, unspecified chronicity, unspecified whether sciatica present    ED Discharge Orders          Ordered    methocarbamol (ROBAXIN) 500 MG tablet  2 times daily        04/04/24 1508    lidocaine  (LIDODERM ) 5 %  Every 24 hours        04/04/24 1508               Neysa Caron PARAS, DO 04/04/24 1647

## 2024-04-04 NOTE — Discharge Instructions (Signed)
 He may take over-the-counter medication such as Tylenol  for pain.  You may also use lidocaine  patches and muscle relaxers we are prescribing you.  Please follow-up with your primary doctor and neurosurgery.  Return for fevers, chills, severe pain, numbness in your genital area or bowel or bladder incontinence.  Weakness in your legs or any new or worsening symptoms that are concerning to you.

## 2024-04-04 NOTE — ED Triage Notes (Signed)
 Pt [facility], but prefers [facility]. PT ambulatory to triage with complaints of LEFT sided back and flank pain. Pt states that the pain is ongoing, and seems to be worsening. PT states that he had an US , and labs to rule out renal calculi 2 months ago.   PT endorses prostate cancer, s/p prostatectomy.

## 2024-04-21 DIAGNOSIS — C61 Malignant neoplasm of prostate: Secondary | ICD-10-CM | POA: Diagnosis not present

## 2024-04-30 NOTE — Progress Notes (Addendum)
 UEL DAVIDOW                                          MRN: [id number]   04/30/2024   The [facility] Quality Team Specialist reviewed this patient medical record for the purposes of chart review for care gap closure. The following were reviewed: chart review for care gap closure-controlling blood pressure.  05/26/2024- no new cbp to close  06/17/2024- cannot close cbp 2025    [facility] Quality Team

## 2024-06-09 ENCOUNTER — Telehealth: Payer: Self-pay | Admitting: Cardiovascular Disease

## 2024-06-09 NOTE — Telephone Encounter (Signed)
 Shared response from Dr. Barbaraann with patient: OK to keep current appointment. If it happens again, I will get him in sooner.    Signed, Darryle DASEN. Barbaraann, MD, [facility]  [facility]  [address] [place], [place] [address] [phone]  10:24 AM  Patient verbalized understanding and expressed appreciation for follow-up.

## 2024-06-09 NOTE — Telephone Encounter (Signed)
 Patient c/o Palpitations: STAT if patient c/o lightheadedness, shortness of breath, or chest pain  How long have you had palpitations/irregular HR/ Afib? Are you having the symptoms now? 11pm last night  Are you currently experiencing lightheadedness, SOB or CP? No  Do you have a history of afib (atrial fibrillation) or irregular heart rhythm? Yes  Have you checked your BP or HR? (document readings if available): N/A  Are you experiencing any other symptoms? No

## 2024-06-09 NOTE — Telephone Encounter (Signed)
 Spoke with pt, he reports at 11 pm his heart rate elevated and it lasted until 3 am. He reports his blood pressure during that time was 131/82 but he did not get his pulse and something is wrong with his watch and he was unable to get an ECG. He reports the pulse felt irregular. He reports he took the rosuvastatin , which he thought would decrease the HR but got confused on which medication to take. Currently his heart rate is 75 bpm and he feels fine. He feels fine currently and has a follow up appointment for his yearly checkup 07/01/24 but wants to make sure Dr Barbaraann does not want to see him sooner. Aware will forward to dr barbaraann.

## 2024-06-18 ENCOUNTER — Ambulatory Visit (INDEPENDENT_AMBULATORY_CARE_PROVIDER_SITE_OTHER): Payer: Medicare HMO | Admitting: *Deleted

## 2024-06-18 ENCOUNTER — Telehealth: Payer: Self-pay | Admitting: *Deleted

## 2024-06-18 VITALS — Ht 73.0 in | Wt 250.0 lb

## 2024-06-18 DIAGNOSIS — Z0000 Encounter for general adult medical examination without abnormal findings: Secondary | ICD-10-CM

## 2024-06-18 NOTE — Progress Notes (Signed)
 "  Chief Complaint  Patient presents with   Medicare Wellness     Subjective:   David Smith is a 69 y.o. male who presents for a Medicare Annual Wellness Visit.  Visit info / Clinical Intake: Medicare Wellness Visit Type:: Subsequent Annual Wellness Visit Persons participating in visit and providing information:: patient Medicare Wellness Visit Mode:: Telephone If telephone:: video declined Since this visit was completed virtually, some vitals may be partially provided or unavailable. Missing vitals are due to the limitations of the virtual format.: Unable to obtain vitals - no equipment If Telephone or Video please confirm:: I connected with patient using audio/video enable telemedicine. I verified patient identity with two identifiers, discussed telehealth limitations, and patient agreed to proceed. Patient Location:: home Provider Location:: office Interpreter Needed?: No Pre-visit prep was completed: yes AWV questionnaire completed by patient prior to visit?: no Living arrangements:: with family/others (daughter) Patient's Overall Health Status Rating: (!) fair Typical amount of pain: some Does pain affect daily life?: (!) yes Are you currently prescribed opioids?: no  Dietary Habits and Nutritional Risks How many meals a day?: 2 (2-3 meals a day) Eats fruit and vegetables daily?: yes Most meals are obtained by: preparing own meals; eating out In the last 2 weeks, have you had any of the following?: none Diabetic:: no  Functional Status Activities of Daily Living (to include ambulation/medication): Independent Ambulation: Independent with device- listed below Home Assistive Devices/Equipment: Cane Medication Administration: Independent Home Management (perform basic housework or laundry): Independent Manage your own finances?: yes Primary transportation is: driving Concerns about vision?: no *vision screening is required for WT[facility]* (up to date with [employer] / Dr  Ladora) Concerns about hearing?: no  Fall Screening Falls in the past year?: 0 Number of falls in past year: 0 Was there an injury with Fall?: 0 Fall Risk Category Calculator: 0 Patient Fall Risk Level: Low Fall Risk  Fall Risk Patient at Risk for Falls Due to: Impaired balance/gait; Orthopedic patient Fall risk Follow up: Falls evaluation completed  Home and Transportation Safety: All rugs have non-skid backing?: yes All stairs or steps have railings?: yes (2 steps up into the house) Grab bars in the bathtub or shower?: yes Have non-skid surface in bathtub or shower?: yes Good home lighting?: yes Regular seat belt use?: yes Hospital stays in the last year:: no  Cognitive Assessment Difficulty concentrating, remembering, or making decisions? : no Will 6CIT or Mini Cog be Completed: yes What year is it?: 0 points What month is it?: 0 points Give patient an address phrase to remember (5 components): [address] [place] [employer] [place]  About what time is it?: 0 points Count backwards from 20 to 1: 0 points Say the months of the year in reverse: 0 points Repeat the address phrase from earlier: 0 points 6 CIT Score: 0 points  Advance Directives (For Healthcare) Does Patient Have a Medical Advance Directive?: Yes Does patient want to make changes to medical advance directive?: No - Patient declined Type of Advance Directive: Healthcare Power of Attorney Copy of Healthcare Power of Attorney in Chart?: Yes - validated most recent copy scanned in chart (See row information)  Reviewed/Updated  Reviewed/Updated: Reviewed All (Medical, Surgical, Family, Medications, Allergies, Care Teams, Patient Goals)    Allergies (verified) Patient has no known allergies.   Current Medications (verified) Outpatient Encounter Medications as of 06/18/2024  Medication Sig   acetaminophen  (TYLENOL ) 500 MG tablet Take 1,000 mg by mouth every 4 (four) hours as needed for moderate  pain (pain score  4-6).   apixaban  (ELIQUIS ) 5 MG TABS tablet Take 1 tablet (5 mg total) by mouth 2 (two) times daily. (Patient taking differently: Take 5 mg by mouth 2 (two) times daily. Takes 1 a day due to blood in urine)   cyanocobalamin (VITAMIN B12) 1000 MCG tablet Take 1,000 mcg by mouth every other day.   diltiazem  (CARTIA  XT) 240 MG 24 hr capsule Take 1 capsule (240 mg total) by mouth daily. Pt needs to schedule appt with provider for further refills   Iron Combinations (IRON COMPLEX PO) Take 1 tablet by mouth daily. 65mg    lidocaine  (LIDODERM ) 5 % Place 1 patch onto the skin daily. Remove & Discard patch within 12 hours or as directed by MD   meclizine  (ANTIVERT ) 25 MG tablet TAKE 1 TABLET BY MOUTH THREE TIMES DAILY AS NEEDED FOR DIZZINESS   Multiple Vitamin (MULTIVITAMIN WITH MINERALS) TABS tablet Take 1 tablet by mouth daily. (Patient taking differently: Take 1 tablet by mouth daily. Takes 1 every other day)   rosuvastatin  (CRESTOR ) 20 MG tablet Take 1 tablet (20 mg total) by mouth daily.   TURMERIC-GINGER PO Take 1 capsule by mouth every other day.   ALPRAZolam  (XANAX ) 0.25 MG tablet Take 1-2 tablets (0.25-0.5 mg total) by mouth 2 (two) times daily as needed for anxiety. (Patient not taking: Reported on 06/18/2024)   methocarbamol  (ROBAXIN ) 500 MG tablet Take 1 tablet (500 mg total) by mouth 2 (two) times daily. (Patient not taking: Reported on 06/18/2024)   [DISCONTINUED] tiZANidine  (ZANAFLEX ) 4 MG tablet Take 1 tablet (4 mg total) by mouth every 6 (six) hours as needed for muscle spasms.   No facility-administered encounter medications on file as of 06/18/2024.    History: Past Medical History:  Diagnosis Date   Anal fissure    Anemia    Post radiation, bloody urine when he drinks/eats acidic things   Anxiety    Chronic lower back pain 06/11/2012   daily a problem Pain level 7, Tylenol  helpful(down to level 2)   Colon polyps    Depression    Due to med for his cancer - Lupron    Dysrhythmia     A. Fib   H/O cardiovascular stress test    non ischemic EF 55% 05-15-2007   Heart murmur    dx as a teen, Stress ECHO 2008 (-)    Hypertension    Hypertrophic obstructive cardiomyopathy (HOCM) (HCC)    PAF (paroxysmal atrial fibrillation) (HCC)    Prostate cancer metastatic to intrapelvic lymph node (HCC)    gleason 4+3=7, volume 56.4 cc, PSA 34.25   Past Surgical History:  Procedure Laterality Date   ACHILLES TENDON REPAIR     26 yrs ago, right foot   ANAL FISSURECTOMY  2008   ANTERIOR CERVICAL DECOMP/DISCECTOMY FUSION N/A 01/05/2021   Procedure: Cervical Three Corpectomy;  Surgeon: Gillie Duncans, MD;  Location: [facility] OR;  Service: Neurosurgery;  Laterality: N/A;   COLONOSCOPY     COLONOSCOPY W/ POLYPECTOMY  2017   dental procedure  06/11/2012   minor -office procedure to correct jaw issue-used anesthesia gas to do   EYE SURGERY Left 2004   piece of small metal removed from eye   LYMPHADENECTOMY  06/17/2012   Procedure: LYMPHADENECTOMY;  Surgeon: Noretta Ferrara, MD;  Location: [facility];  Service: Urology;  Laterality: Bilateral;   POSTERIOR CERVICAL LAMINECTOMY N/A 09/27/2022   Procedure: Posterior Cervical Laminectomy - Cervical Three - Cervical Four;  Surgeon: Gillie Duncans, MD;  Location: [facility] OR;  Service: Neurosurgery;  Laterality: N/A;   PROSTATE BIOPSY     ROBOT ASSISTED LAPAROSCOPIC RADICAL PROSTATECTOMY  06/17/2012   Procedure: ROBOTIC ASSISTED LAPAROSCOPIC RADICAL PROSTATECTOMY LEVEL 2;  Surgeon: Noretta Ferrara, MD;  Location: [facility];  Service: Urology;  Laterality: N/A;      Family History  Problem Relation Age of Onset   Cancer Mother        breast   Heart disease Mother    Cancer Father 46       prostate, removed   Colon cancer Brother    Cancer Brother 7       prostate, tx w/xrt   Colon polyps Neg Hx    Esophageal cancer Neg Hx    Rectal cancer Neg Hx    Stomach cancer Neg Hx    Social History   Occupational History   Occupation: disability d/t back pain-not  working at present  Tobacco Use   Smoking status: Never   Smokeless tobacco: Never  Vaping Use   Vaping status: Never Used  Substance and Sexual Activity   Alcohol use: Not Currently    Alcohol/week: 0.0 standard drinks of alcohol   Drug use: No   Sexual activity: Not Currently   Tobacco Counseling Counseling given: Not Answered  SDOH Screenings   Food Insecurity: Food Insecurity Present (06/18/2024)  Housing: Low Risk (06/18/2024)  Transportation Needs: No Transportation Needs (06/18/2024)  Utilities: Not At Risk (06/18/2024)  Alcohol Screen: Low Risk (06/18/2023)  Depression (PHQ2-9): Medium Risk (06/18/2024)  Financial Resource Strain: Medium Risk (12/23/2023)  Physical Activity: Insufficiently Active (06/18/2024)  Social Connections: Moderately Integrated (06/18/2024)  Stress: Stress Concern Present (06/18/2024)  Tobacco Use: Low Risk (06/18/2024)  Health Literacy: Adequate Health Literacy (06/18/2023)   See flowsheets for full screening details  Depression Screen PHQ 2 & 9 Depression Scale- Over the past 2 weeks, how often have you been bothered by any of the following problems? Little interest or pleasure in doing things: 3 (Grief over loss of wife) Feeling down, depressed, or hopeless (PHQ Adolescent also includes...irritable): 3 PHQ-2 Total Score: 6 Trouble falling or staying asleep, or sleeping too much: 0 Feeling tired or having little energy: 3 Poor appetite or overeating (PHQ Adolescent also includes...weight loss): 0 Feeling bad about yourself - or that you are a failure or have let yourself or your family down: 0 Trouble concentrating on things, such as reading the newspaper or watching television (PHQ Adolescent also includes...like school work): 0 Moving or speaking so slowly that other people could have noticed. Or the opposite - being so fidgety or restless that you have been moving around a lot more than usual: 0 Thoughts that you would be better off dead, or of  hurting yourself in some way: 0 PHQ-9 Total Score: 9 If you checked off any problems, how difficult have these problems made it for you to do your work, take care of things at home, or get along with other people?: Not difficult at all (I still do what I need to do)  Depression Treatment Depression Interventions/Treatment : -- (Pt already has contact info for [facility] and [employer] provided by [employer].)     Goals Addressed   None          Objective:    Today's Vitals   06/18/24 1101  Weight: 250 lb (113.4 kg)  Height: 6' 1 (1.854 m)   Body mass index is 32.98 kg/m.  Hearing/Vision screen No  results found. Immunizations and Health Maintenance Health Maintenance  Topic Date Due   DTaP/Tdap/Td (2 - Td or Tdap) 11/22/2021   COVID-19 Vaccine (8 - 2025-26 season) 02/04/2025 (Originally 02/04/2024)   Medicare Annual Wellness (AWV)  06/18/2025   Colonoscopy  03/21/2031   Pneumococcal Vaccine: 50+ Years  Completed   Influenza Vaccine  Completed   Hepatitis C Screening  Completed   Zoster Vaccines- Shingrix   Completed   Meningococcal B Vaccine  Aged Out        Assessment/Plan:  This is a routine wellness examination for [place].  Patient Care Team: [name], MD as PCP - General (Internal Medicine) O'Neal, Darryle Ned, MD as PCP - Cardiology (Internal Medicine) Renda Glance, MD as Consulting Physician (Urology) Skeet Juliene SAUNDERS, DO as Consulting Physician (Neurology) Vertell Pont, RN as Oncology Nurse Navigator Danis, Victory LITTIE MOULD, MD as Consulting Physician (Gastroenterology) Ladora, [place], [place] as Referring Physician (Optometry) Gillie Duncans, MD as Consulting Physician (Neurosurgery)  I have personally reviewed and noted the following in the patients chart:   Medical and social history Use of alcohol, tobacco or illicit drugs  Current medications and supplements including opioid prescriptions. Functional ability and  status Nutritional status Physical activity Advanced directives List of other physicians Hospitalizations, surgeries, and ER visits in previous 12 months Vitals Screenings to include cognitive, depression, and falls Referrals and appointments  No orders of the defined types were placed in this encounter.  In addition, I have reviewed and discussed with patient certain preventive protocols, quality metrics, and best practice recommendations. A written personalized care plan for preventive services as well as general preventive health recommendations were provided to patient.   Lolita Libra, CMA   06/18/2024   Return in 1 year (on 06/18/2025).  After Visit Summary: (MyChart) Due to this being a telephonic visit, the after visit summary with patients personalized plan was offered to patient via MyChart   Nurse Notes: HM Addressed: Vaccines Due: Tdap  "

## 2024-06-18 NOTE — Telephone Encounter (Signed)
 Thank you for let me know, he has an appointment with me next week.  Will address those issues.

## 2024-06-18 NOTE — Telephone Encounter (Signed)
 Pt had AWV today. He scored 9 on PHQ-9. Grieving the loss of his spouse. Pt received mental health options from his insurance for [facility] and [facility] that he will reach out to for counseling.  Pt also mentions concerns over finances and worries that he might run out of food.  Community resource was provided today for food bank.

## 2024-06-18 NOTE — Patient Instructions (Addendum)
 [name],  Thank you for taking the time for your Medicare Wellness Visit. I appreciate your continued commitment to your health goals. Please review the care plan we discussed, and feel free to reach out if I can assist you further.  Please note that Annual Wellness Visits do not include a physical exam. Some assessments may be limited, especially if the visit was conducted virtually. If needed, we may recommend an in-person follow-up with your provider.  Ongoing Care Seeing your primary care provider every 3 to 6 months helps us  monitor your health and provide consistent, personalized care.   Dr Amon: 06/24/24 10am, physical Medicare AWV: 06/19/25  11am, telephone  Referrals If a referral was made during today's visit and you haven't received any updates within two weeks, please contact the referred provider directly to check on the status.  [facility] [employer]:  [phone], [address] [place], [place] [address]  Recommended Screenings: You will need to get the following vaccines at your local pharmacy: Tdap (tetanus booster)  Health Maintenance  Topic Date Due   DTaP/Tdap/Td vaccine (2 - Td or Tdap) 11/22/2021   Medicare Annual Wellness Visit  06/17/2024   Flu Shot  09/02/2024*   COVID-19 Vaccine (8 - 2025-26 season) 02/04/2025*   Colon Cancer Screening  03/21/2031   Pneumococcal Vaccine for age over 44  Completed   Hepatitis C Screening  Completed   Zoster (Shingles) Vaccine  Completed   Meningitis B Vaccine  Aged Out  *Topic was postponed. The date shown is not the original due date.       06/18/2023    1:11 PM  Advanced Directives  Does Patient Have a Medical Advance Directive? Yes  Type of Estate Agent of [place];Living will  Does patient want to make changes to medical advance directive? No - Patient declined  Copy of Healthcare Power of Attorney in Chart? Yes - validated most recent copy scanned in chart (See row information)     Vision: Annual vision screenings are recommended for early detection of glaucoma, cataracts, and diabetic retinopathy. These exams can also reveal signs of chronic conditions such as diabetes and high blood pressure.  Dental: Annual dental screenings help detect early signs of oral cancer, gum disease, and other conditions linked to overall health, including heart disease and diabetes.  Please see the attached documents for additional preventive care recommendations.

## 2024-06-23 NOTE — Progress Notes (Unsigned)
" °  Cardiology Office Note:  .   Date:  06/23/2024  ID:  David Smith, DOB 11-04-1955, MRN [id number] PCP: [name], MD  [facility] Providers Cardiologist:  [name] Decent, MD   History of Present Illness: .   No chief complaint on file.   David Smith is a 69 y.o. male with below history who presents for follow-up.   History of Present Illness               Problem List 1. HOCM -23 mm septum (sigmoid septum variant) -LGE 2% -negative monitor for NSVT 07/2022 -no syncope -no family history of SCD -LVOTO 57 mmHG -> 10 mmHG with treatment -ETT 06/08/2020 -> Normal BP response to exercise  2. Atrial fibrillation, paroxysmal 3. HTN 4. Aortic aneurysm  -44 mm on CMR 02/2020 -43 mm 04/04/2024 5. Prostate CA -s/p prostatectomy/radiation -on anti-androgen therapy  -c/b radiation colitis/bladder damage 6. HLD -T chol 127, HDL 44, LDL 66, TG 84 7. PVCs  -4.4% burden    ROS: All other ROS reviewed and negative. Pertinent positives noted in the HPI.     Studies Reviewed: SABRA       TTE 07/07/2022  1. Severe hypertrophy of basal septum; mild LVOT gradient at rest 1.4 m/s  increasing to 1.8 m/s with valsalva (findings c/w HCM); moderately dilated  ascending aorta (4.6 cm).   2. Left ventricular ejection fraction, by estimation, is 60 to 65%. The  left ventricle has normal function. The left ventricle has no regional  wall motion abnormalities. There is severe left ventricular hypertrophy of  the basal-septal segment. Left  ventricular diastolic parameters are consistent with Grade I diastolic  dysfunction (impaired relaxation).   3. Right ventricular systolic function is normal. The right ventricular  size is normal. There is normal pulmonary artery systolic pressure.   4. Left atrial size was moderately dilated.   5. The mitral valve is normal in structure. Mild mitral valve  regurgitation. No evidence of mitral stenosis.   6. The aortic valve is tricuspid.  Aortic valve regurgitation is mild to  moderate. No aortic stenosis is present.   7. Aortic dilatation noted. There is borderline dilatation of the aortic  root, measuring 39 mm. There is moderate dilatation of the ascending  aorta, measuring 46 mm.   8. The inferior vena cava is normal in size with greater than 50%  respiratory variability, suggesting right atrial pressure of 3 mmHg.    Physical Exam:   VS:  There were no vitals taken for this visit.   Wt Readings from Last 3 Encounters:  06/18/24 250 lb (113.4 kg)  03/20/24 247 lb (112 kg)  02/12/24 247 lb (112 kg)    GEN: Well nourished, well developed in no acute distress NECK: No JVD; No carotid bruits CARDIAC: ***RRR, no murmurs, rubs, gallops RESPIRATORY:  Clear to auscultation without rales, wheezing or rhonchi  ABDOMEN: Soft, non-tender, non-distended EXTREMITIES:  No edema; No deformity  ASSESSMENT AND PLAN: .   Assessment and Plan                 {Are you ordering a CV Procedure (e.g. stress test, cath, DCCV, TEE, etc)?   Press F2        :[id number]}   Follow-up: No follow-ups on file.  Signed, [name]. Decent, MD, [facility]  [facility]  [address] [place], [place] [address] [phone]  9:40 AM   "

## 2024-06-24 ENCOUNTER — Ambulatory Visit (INDEPENDENT_AMBULATORY_CARE_PROVIDER_SITE_OTHER): Admitting: Internal Medicine

## 2024-06-24 ENCOUNTER — Encounter: Payer: Self-pay | Admitting: Internal Medicine

## 2024-06-24 VITALS — BP 136/70 | HR 64 | Temp 98.0°F | Resp 18 | Ht 73.0 in | Wt 257.1 lb

## 2024-06-24 DIAGNOSIS — R739 Hyperglycemia, unspecified: Secondary | ICD-10-CM

## 2024-06-24 DIAGNOSIS — I480 Paroxysmal atrial fibrillation: Secondary | ICD-10-CM

## 2024-06-24 DIAGNOSIS — Z0000 Encounter for general adult medical examination without abnormal findings: Secondary | ICD-10-CM

## 2024-06-24 DIAGNOSIS — C778 Secondary and unspecified malignant neoplasm of lymph nodes of multiple regions: Secondary | ICD-10-CM | POA: Diagnosis not present

## 2024-06-24 DIAGNOSIS — Z0001 Encounter for general adult medical examination with abnormal findings: Secondary | ICD-10-CM

## 2024-06-24 DIAGNOSIS — I10 Essential (primary) hypertension: Secondary | ICD-10-CM | POA: Diagnosis not present

## 2024-06-24 DIAGNOSIS — C61 Malignant neoplasm of prostate: Secondary | ICD-10-CM

## 2024-06-24 DIAGNOSIS — M4802 Spinal stenosis, cervical region: Secondary | ICD-10-CM

## 2024-06-24 DIAGNOSIS — R310 Gross hematuria: Secondary | ICD-10-CM | POA: Diagnosis not present

## 2024-06-24 DIAGNOSIS — E785 Hyperlipidemia, unspecified: Secondary | ICD-10-CM | POA: Diagnosis not present

## 2024-06-24 LAB — CBC WITH DIFFERENTIAL/PLATELET
Basophils Absolute: 0 K/uL (ref 0.0–0.1)
Basophils Relative: 0.6 % (ref 0.0–3.0)
Eosinophils Absolute: 0.1 K/uL (ref 0.0–0.7)
Eosinophils Relative: 2.9 % (ref 0.0–5.0)
HCT: 40.2 % (ref 39.0–52.0)
Hemoglobin: 13.2 g/dL (ref 13.0–17.0)
Lymphocytes Relative: 21.7 % (ref 12.0–46.0)
Lymphs Abs: 0.9 K/uL (ref 0.7–4.0)
MCHC: 32.7 g/dL (ref 30.0–36.0)
MCV: 83.1 fl (ref 78.0–100.0)
Monocytes Absolute: 0.2 K/uL (ref 0.1–1.0)
Monocytes Relative: 6 % (ref 3.0–12.0)
Neutro Abs: 2.8 K/uL (ref 1.4–7.7)
Neutrophils Relative %: 68.8 % (ref 43.0–77.0)
Platelets: 143 K/uL — ABNORMAL LOW (ref 150.0–400.0)
RBC: 4.84 Mil/uL (ref 4.22–5.81)
RDW: 14.4 % (ref 11.5–15.5)
WBC: 4 K/uL (ref 4.0–10.5)

## 2024-06-24 LAB — URINALYSIS, ROUTINE W REFLEX MICROSCOPIC
Bilirubin Urine: NEGATIVE
Ketones, ur: NEGATIVE
Leukocytes,Ua: NEGATIVE
Nitrite: NEGATIVE
Specific Gravity, Urine: >=1.03 — AB (ref 1.000–1.030)
Total Protein, Urine: 100 — AB
Urine Glucose: NEGATIVE
Urobilinogen, UA: 0.2 (ref 0.0–1.0)
pH: 5.5 (ref 5.0–8.0)

## 2024-06-24 LAB — BASIC METABOLIC PANEL WITH GFR
BUN: 20 mg/dL (ref 6–23)
CO2: 30 meq/L (ref 19–32)
Calcium: 9.6 mg/dL (ref 8.4–10.5)
Chloride: 107 meq/L (ref 96–112)
Creatinine, Ser: 1.19 mg/dL (ref 0.40–1.50)
GFR: 63.67 mL/min
Glucose, Bld: 84 mg/dL (ref 70–99)
Potassium: 4.6 meq/L (ref 3.5–5.1)
Sodium: 141 meq/L (ref 135–145)

## 2024-06-24 LAB — IBC + FERRITIN
Ferritin: 31.9 ng/mL (ref 22.0–322.0)
Iron: 70 ug/dL (ref 42–165)
Saturation Ratios: 17.4 % — ABNORMAL LOW (ref 20.0–50.0)
TIBC: 403.2 ug/dL (ref 250.0–450.0)
Transferrin: 288 mg/dL (ref 212.0–360.0)

## 2024-06-24 NOTE — Progress Notes (Unsigned)
 "  Subjective:    Patient ID: David Smith, male    DOB: [birth year], 69 y.o.   MRN: [id number]  DOS:  06/24/2024 CPX  Discussed the use of AI scribe software for clinical note transcription with the patient, who gave verbal consent to proceed.  History of Present Illness David Smith is a 69 year old male with atrial fibrillation and prostate cancer who presents for a comprehensive physical exam.  Atrial fibrillation and palpitations - Recurrent episodes of atrial fibrillation with palpitations - No syncope - Documents arrhythmia episodes with portable EKG - Remains on Eliquis , but has reduced dose on his own due to concern for worsening hematuria  Hematuria - Chronic hematuria since prostate cancer radiation approximately nine years ago - Hematuria worsened by acidic beverages - Persistent while on anticoagulation with Eliquis   Chest wall pain - Persistent left-sided chest wall pain at prior radiation site since last spinal surgery - Pain managed with as-needed Tylenol  - No associated nausea, vomiting, diarrhea, fever, chills, or dysuria  Dizziness and vertigo - Chronic dizziness with gradual worsening - Dizzy every morning upon getting out of bed, requires sitting until resolution - Evaluated by ENT - Uses meclizine  as needed for vertigo - No stroke-like symptoms including slurred speech or double vision    Review of Systems See above   Past Medical History:  Diagnosis Date   Anal fissure    Anemia    Post radiation, bloody urine when he drinks/eats acidic things   Anxiety    Chronic lower back pain 06/11/2012   daily a problem Pain level 7, Tylenol  helpful(down to level 2)   Colon polyps    Depression    Due to med for his cancer - Lupron    Dysrhythmia    A. Fib   H/O cardiovascular stress test    non ischemic EF 55% 05-15-2007   Heart murmur    dx as a teen, Stress ECHO 2008 (-)    Hypertension    Hypertrophic obstructive cardiomyopathy (HOCM) (HCC)     PAF (paroxysmal atrial fibrillation) (HCC)    Prostate cancer metastatic to intrapelvic lymph node (HCC)    gleason 4+3=7, volume 56.4 cc, PSA 34.25    Past Surgical History:  Procedure Laterality Date   ACHILLES TENDON REPAIR     26 yrs ago, right foot   ANAL FISSURECTOMY  2008   ANTERIOR CERVICAL DECOMP/DISCECTOMY FUSION N/A 01/05/2021   Procedure: Cervical Three Corpectomy;  Surgeon: Gillie Duncans, MD;  Location: [facility] OR;  Service: Neurosurgery;  Laterality: N/A;   COLONOSCOPY     COLONOSCOPY W/ POLYPECTOMY  2017   dental procedure  06/11/2012   minor -office procedure to correct jaw issue-used anesthesia gas to do   EYE SURGERY Left 2004   piece of small metal removed from eye   LYMPHADENECTOMY  06/17/2012   Procedure: LYMPHADENECTOMY;  Surgeon: Noretta Ferrara, MD;  Location: [facility];  Service: Urology;  Laterality: Bilateral;   POSTERIOR CERVICAL LAMINECTOMY N/A 09/27/2022   Procedure: Posterior Cervical Laminectomy - Cervical Three - Cervical Four;  Surgeon: Gillie Duncans, MD;  Location: [facility] OR;  Service: Neurosurgery;  Laterality: N/A;   PROSTATE BIOPSY     ROBOT ASSISTED LAPAROSCOPIC RADICAL PROSTATECTOMY  06/17/2012   Procedure: ROBOTIC ASSISTED LAPAROSCOPIC RADICAL PROSTATECTOMY LEVEL 2;  Surgeon: Noretta Ferrara, MD;  Location: [facility];  Service: Urology;  Laterality: N/A;       Current Outpatient Medications  Medication Instructions   acetaminophen  (TYLENOL ) 1,000 mg, Every  4 hours PRN   ALPRAZolam  (XANAX ) 0.25-0.5 mg, Oral, 2 times daily PRN   apixaban  (ELIQUIS ) 5 mg, Oral, 2 times daily   cyanocobalamin (VITAMIN B12) 1,000 mcg, Every other day   diltiazem  (CARTIA  XT) 240 mg, Oral, Daily, Pt needs to schedule appt with provider for further refills   Iron Combinations (IRON COMPLEX PO) 1 tablet, Daily   lidocaine  (LIDODERM ) 5 % 1 patch, Transdermal, Every 24 hours, Remove & Discard patch within 12 hours or as directed by MD   meclizine  (ANTIVERT ) 25 MG tablet TAKE 1 TABLET BY  MOUTH THREE TIMES DAILY AS NEEDED FOR DIZZINESS   Multiple Vitamin (MULTIVITAMIN WITH MINERALS) TABS tablet 1 tablet, Daily   rosuvastatin  (CRESTOR ) 20 mg, Oral, Daily   TURMERIC-GINGER PO 1 capsule, Every other day       Objective:   Physical Exam BP 136/70   Pulse 64   Temp 98 F (36.7 C) (Oral)   Resp 18   Ht 6' 1 (1.854 m)   Wt 257 lb 2 oz (116.6 kg)   SpO2 99%   BMI 33.92 kg/m  General:   Well developed, NAD, BMI noted.  HEENT:  Normocephalic . Face symmetric, atraumatic Lungs:  CTA B Normal respiratory effort, no intercostal retractions, no accessory muscle use. Heart: RRR,  no murmur.  Abdomen:  Not distended, soft, non-tender. No rebound or rigidity.   Skin: Not pale. Not jaundice Lower extremities: no pretibial edema bilaterally  Neurologic:  alert & oriented X3.  Speech normal, gait and transfer very limited, uses a cane.  Psych--  Cognition and judgment appear intact.  Cooperative with normal attention span and concentration.  Behavior appropriate. No anxious or depressed appearing.     Assessment   Problem list: No transfusions, Jehovah witness hyperglycemia: 01-2016 A1C >>> 6.0 HTN Dyslipidemia CV: ---New onset A. fib and new dx of HOCM (02-2020) --- Ascending Aorta dilatation GU: --Prostate cancer, Hormone sensitive. DX 2013, PSA 34.25, s/p prostatectomy, lymphadenectomy 2014. S/p XRT; + lymph nods mets , PSA increased, s/p lupron    --Hematuria- CT -cysto 2016 per urology-- likely d/t h/o XRT --2021 PSA undetectable.   --2022 stopped ADT --February 2024: PET scan showed oligometastatic prostate cancer disease with mediastinal and presacral nodes activity, did XRT Spinal stenosis cervical : surgery 2022 and a posterior cervical laminectomy April 2024.  Mild anemia, normal iron and ferritin 06-2015 Syncope, dizziness  headaches:  -Saw neurology 4-17,w/u done, CTA head, neck, then neck MRI: dx w/ spinal compression per MRI.  Saw ENT  03-2021. Shingles, R face,2017   Insomnia-- no need for med as off 07/2017   Here for CPX Tdap: 2013 PNM 23: 2020; PNM 20: 06/2023 S/p Shingrix  x 2 per pt ; s/p RSV Had a flu shot  Rec a Tdap and covid vax if not done recently   CCS: scope 07-2015, +polyps, 5 years per GI letter ; cscope 03/2024, next per GI H/o prostate ca, per urology  Blood work today: BMP CBC iron ferritin [facility] POA: Information provided.    Constipation, dysphagia, colon polyps. Saw GI visit 02/12/2024, rec MiraLAX, Dulcolax.  Had a EGD and a colonoscopy October 2025 EGD: Tortuous esophagus, BX marked chronic active gastritis, Helicobacter present.  Prescribed treatment.  Encouraged to follow-up with GI  Metastatic prostate cancer: LOV urology 12/2023, they noted the PSA continued to increase from previous values (PSA was 0.5). At the time the plan was to continue monitoring PSA.  They were considering a DEXA as well.  ER visit due to back pain:  BMP okay, hemoglobin 12.3 slightly low.   CT thoracic and lumbar spine: Spondylosis, DJD changes, central canal stenosis and neuroforaminal encroachment L2-3 through L4-5 CT angio: Stable 4.3 cm ascending aortic aneurysm.  They recommended follow-up CT in 1 year.  See full report. Assessment and Plan Assessment & Plan Malignant neoplasm of prostate complicated by radiation cystitis and gross hematuria Chronic gross hematuria due to radiation cystitis from prostate cancer treatment. Hematuria exacerbated by acidic beverages. Increased frequency noted. - Strongly encouraged urology follow-up ASAP to address gross hematuria and potential alternative anticoagulation options. Get a UA urine culture to be sure there is no intercurrent UTI. Paroxysmal atrial fibrillation Episodes documented with home EKG monitor. Holding Eliquis  due to gross hematuria, increasing stroke risk. No syncope or significant symptoms during episodes. - Discuss with cardiologist regarding  alternative anticoagulation options due to gross hematuria. - Continue home EKG monitoring for AFib episodes.  Essential hypertension Blood pressure well-controlled on current regimen. - Continue current antihypertensive regimen. - Recommended home blood pressure monitoring.  Hyperlipidemia Managed with rosuvastatin . Last LDL level satisfactory at 69 mg/dL. - Continue rosuvastatin  20 mg daily.  Hyperglycemia Last A1c satisfactory.  Lumbar spinal stenosis, status post acute exacerbation Previous acute exacerbation with significant improvement. No current numbness or significant pain. Uses a cane for ambulation. - Offered pain management options, declined by patient.  Vertigo Chronic vertigo with recent worsening, now daily upon waking. No associated stroke symptoms. Previous ENT evaluations recommended meclizine  as needed. - Continue meclizine  as needed for vertigo symptoms. - Discuss with cardiologist regarding potential brain imaging to rule out other causes of dizziness.    -=--------- PLAN: Hyperglycemia: Check A1c HTN: BP looks very good, on Cartia . Dyslipidemia: On rosuvastatin , last LFTs normal, check FLP, patient fasting. Paroxysmal A-fib, HOCM: Per cardiology, seems stable, tolerating anticoagulation. Dysphagia, constipation, abdominal pain, right-sided. Multiple GI symptoms: See LOV, ultrasound of the abdomen show hepatic steatosis, hemoglobin stable, chest x-ray negative.  To see GI soon Spinal stenosis sequela: Had 2 surgeries, walks with difficulty, uses a cane, parking permit signed. Prostate cancer w/ mets in multiple locations per urology. Preventive care: Reports he already had a flu shot, recommend a RSV COVID booster RTC for a physical exam around 06-2024      "

## 2024-06-24 NOTE — Patient Instructions (Addendum)
 Please read your instructions carefully.   GO TO THE LAB :  Get the blood work    Go to the front desk for the checkout Please make an appointment for a checkup in 6 months   I recommend you not to skip the Eliquis   Call your urologist as soon as you can  Vaccines I recommend: Tdap, tetanus shot. COVID booster  You were diagnosed with H. pylori, stomach infection, call gastroenterology, you will need a follow-up.

## 2024-06-25 ENCOUNTER — Encounter: Payer: Self-pay | Admitting: Internal Medicine

## 2024-06-25 LAB — URINE CULTURE
MICRO NUMBER:: 17490568
Result:: NO GROWTH
SPECIMEN QUALITY:: ADEQUATE

## 2024-06-25 NOTE — Assessment & Plan Note (Signed)
 Here for CPX  Other issues ER visit due to back pain for 2025:  BMP okay, hemoglobin 12.3 slightly low.   CT thoracic and lumbar spine: Spondylosis, DJD changes, central canal stenosis and neuroforaminal encroachment L2-3 through L4-5 CT angio: Stable 4.3 cm ascending aortic aneurysm.  They recommended follow-up CT in 1 year.  See full report. Constipation, dysphagia, colon polyps. Saw GI visit 02/12/2024, rec MiraLAX, Dulcolax.  Had a EGD and a colonoscopy October 2025 EGD: Tortuous esophagus, BX marked chronic active gastritis, Helicobacter present.  Prescribed treatment.  Encouraged to follow-up with GI Metastatic prostate cancer, hematuria: LOV urology 12/2023, they noted the PSA continued to increase from previous values (PSA was 0.5). At the time the plan was to continue monitoring PSA.  They were considering a DEXA as well. Has gross hematuria on and off, not a new problem, was told to be from radiation cystitis.  Stop encouraged to follow-up with urology ASAP.  Will get a UA and a urine culture to rule out concomitant UTI.  Encouraged not to skip Eliquis . Paroxysmal atrial fibrillation Episodes documented with home EKG monitor. Pt self holding Eliquis  due to gross hematuria, increasing stroke risk.  Encouraged not to skip Eliquis . HTN Blood pressure well-controlled on current regimen.  Hyperlipidemia Managed with rosuvastatin . Last LDL level satisfactory at 69 mg/dL. Hyperglycemia Last A1c satisfactory. Spinal stenosis, acute exacerbation October 2025 now with significant improvement. No current numbness or significant pain. Uses a cane for ambulation. Offered pain management options, declined by patient. Vertigo Chronic vertigo, previously seen by ENT, meclizine  as needed. RTC 6 months

## 2024-06-25 NOTE — Assessment & Plan Note (Signed)
 Here for CPX Tdap: 2013 PNM 23: 2020; PNM 20: 06/2023 S/p Shingrix  x 2 per pt ; s/p RSV Had a flu shot  Rec a Tdap and covid vax if not done recently  CCS: scope 07-2015, +polyps, 5 years per GI letter ; cscope 03/2024, next per GI H/o prostate ca, per urology  Blood work today: BMP CBC iron ferritin [facility] POA: Information provided.

## 2024-06-27 ENCOUNTER — Ambulatory Visit: Payer: Self-pay | Admitting: Internal Medicine

## 2024-07-01 ENCOUNTER — Ambulatory Visit: Admitting: Cardiovascular Disease

## 2024-07-01 DIAGNOSIS — I421 Obstructive hypertrophic cardiomyopathy: Secondary | ICD-10-CM

## 2024-07-01 DIAGNOSIS — I7121 Aneurysm of the ascending aorta, without rupture: Secondary | ICD-10-CM

## 2024-07-01 DIAGNOSIS — I480 Paroxysmal atrial fibrillation: Secondary | ICD-10-CM

## 2024-07-01 DIAGNOSIS — I10 Essential (primary) hypertension: Secondary | ICD-10-CM

## 2024-07-01 NOTE — Progress Notes (Unsigned)
 " Cardiology Office Note:  .   Date:  07/02/2024  ID:  David Smith, DOB 03/22/1956, MRN [id number] PCP: David Aloysius BRAVO, MD  [facility] Providers Cardiologist:  David Smith Decent, MD   History of Present Illness: .    Chief Complaint  Patient presents with   Follow-up    David Smith is a 69 y.o. male with below history who presents for follow-up.   History of Present Illness   David Smith is a 69 year old male with hypertrophic cardiomyopathy, pAF, aortic aneurysm, HTN, AI who presents for follow-up.  He has hypertrophic cardiomyopathy and is on diltiazem  240 mg daily. He denies significant shortness of breath or syncope. He mentions a leaky heart valve and a history of a swishing sound in his heart since he was [age].  He has experienced paroxysmal atrial fibrillation with two episodes in the past eight months, characterized by heart racing. He manages these episodes with diltiazem , which eventually slows his heart rate. He is on Eliquis  5 mg twice a day but discontinues it when experiencing significant hematuria.  He has hypertension with recent home blood pressure readings as high as 180/94 mmHg. He checks his blood pressure occasionally and notes it has been running high lately.  He has a history of an aortic aneurysm which has been stable. He also has a history of prostate cancer, which has metastasized to lymph nodes in his lower back and chest, and he has undergone radiation treatment for this.  He experiences chest pain that is sharp and sometimes throbbing, radiating to his head. He attributes this to nerve issues following spinal surgery, which has left him with numbness in his left arm. He takes Tylenol  for this pain.  He experiences dizziness and has a history of vertigo. He is not very active due to spinal issues but tries to walk when possible.  He has a history of hematuria following prostate cancer treatment, which he attributes to a radiation burn in his  bladder. Certain foods and drinks, such as citrus and alcohol, exacerbate the bleeding.           Problem List 1. HOCM -23 mm septum (sigmoid septum variant) -LGE 2% -negative monitor for NSVT 07/2022 -no syncope -no family history of SCD -LVOTO 57 mmHG -> 10 mmHG with treatment -ETT 06/08/2020 -> Normal BP response to exercise  2. Atrial fibrillation, paroxysmal 3. HTN 4. Aortic aneurysm  -44 mm on CMR 02/2020 -43 mm 04/04/2024 5. Prostate CA -s/p prostatectomy/radiation -on anti-androgen therapy  -c/b radiation colitis/bladder damage 6. HLD -T chol 121, HDL 43, LDL 69, TG 45 7. PVCs  -4.4% burden    ROS: All other ROS reviewed and negative. Pertinent positives noted in the HPI.     Studies Reviewed: SABRA   EKG Interpretation Date/Time:  Wednesday July 02 2024 13:51:04 EST Ventricular Rate:  64 PR Interval:  176 QRS Duration:  98 QT Interval:  432 QTC Calculation: 445 R Axis:   -61  Text Interpretation: Normal sinus rhythm Left anterior fascicular block Moderate voltage criteria for LVH, may be normal variant ( R in aVL , Cornell product ) Confirmed by Decent David [phone]) on 07/02/2024 1:58:45 PM   TTE 07/07/2022  1. Severe hypertrophy of basal septum; mild LVOT gradient at rest 1.4 m/s  increasing to 1.8 m/s with valsalva (findings c/w HCM); moderately dilated  ascending aorta (4.6 cm).   2. Left ventricular ejection fraction, by estimation, is 60 to 65%.  The  left ventricle has normal function. The left ventricle has no regional  wall motion abnormalities. There is severe left ventricular hypertrophy of  the basal-septal segment. Left  ventricular diastolic parameters are consistent with Grade I diastolic  dysfunction (impaired relaxation).   3. Right ventricular systolic function is normal. The right ventricular  size is normal. There is normal pulmonary artery systolic pressure.   4. Left atrial size was moderately dilated.   5. The mitral valve is normal in  structure. Mild mitral valve  regurgitation. No evidence of mitral stenosis.   6. The aortic valve is tricuspid. Aortic valve regurgitation is mild to  moderate. No aortic stenosis is present.   7. Aortic dilatation noted. There is borderline dilatation of the aortic  root, measuring 39 mm. There is moderate dilatation of the ascending  aorta, measuring 46 mm.   8. The inferior vena cava is normal in size with greater than 50%  respiratory variability, suggesting right atrial pressure of 3 mmHg.    Physical Exam:   VS:  BP (!) 146/78   Pulse 65   Ht 6' 1 (1.854 m)   Wt 256 lb 12.8 oz (116.5 kg)   SpO2 99%   BMI 33.88 kg/m    Wt Readings from Last 3 Encounters:  07/02/24 256 lb 12.8 oz (116.5 kg)  06/24/24 257 lb 2 oz (116.6 kg)  06/18/24 250 lb (113.4 kg)    GEN: Well nourished, well developed in no acute distress NECK: No JVD; No carotid bruits CARDIAC: RRR, +diastolic murmur  RESPIRATORY:  Clear to auscultation without rales, wheezing or rhonchi  ABDOMEN: Soft, non-tender, non-distended EXTREMITIES:  No edema; No deformity  ASSESSMENT AND PLAN: .   Assessment and Plan    Hypertrophic cardiomyopathy Significant LVOT gradient but treated well with diliazem. Most recent echo without gradient. Negative monitor last year. 2% LGE. No SCD risk factors.  - continue dilt 240  - update echo.  - Repeat cardiac MRI next year.  Paroxysmal atrial fibrillation Two episodes in past 6-8 months. Managed with diltiazem  and Eliquis . Discussed ablation and Watchman procedure. - Referred to electrophysiologist for ablation and Watchman procedure consideration. - Continue eliquis  5 mg BID.   Primary hypertension Elevated blood pressure with recent readings. Home readings variable. - Started losartan  25 mg daily. - Instructed to check blood pressure daily and record readings.  Aortic valve regurgitation Moderate aortic insufficiency, diastolic murmur. - Ordered  echocardiogram.  Thoracic aortic aneurysm, stable Aneurysm remains stable. - Will measure aorta next year.  Metastatic prostate cancer, post-prostatectomy and radiation Metastatic spread to lymph nodes and chest. - Continue monitoring with urologist.  Hematuria due to radiation cystitis Hematuria from radiation cystitis. Bleeding exacerbated by citrus, sodas, alcohol. Blood counts stable. - Continue to monitor hematuria and adjust Eliquis  use as needed.        HeartCare Referral for Left Atrial Appendage Closure with Non-Valvular Atrial Fibrillation   David Smith is a 69 y.o. male is being referred to the [facility] Team for evaluation for Left Atrial Appendage Closure with Watchman device for the management of stroke risk resulting form non-valvular atrial fibrillation.    Base upon Mr. People history, he is felt to be a poor candidate for long-term anticoagulation because of a history of bleeding (e.g. intracerebral, subdural, GI, retro-peritoneal).  The patient has a HAS-BLED score of 4 indicating a Yearly Major Bleeding Risk of 8.7%.    His CHADS2-VASc Score is 2 with an unadjusted  Ischemic Stroke Rate (% per year) of 2.2%. \  His stroke risk necessitates a strategy of stroke prevention with either long-term oral anticoagulation or left atrial appendage occlusion therapy. We have discussed their bleeding risk in the context of their comorbid medical problems, as well as the rationale for referral for evaluation of Watchman left atrial appendage occlusion therapy. While the patient is at high long-term bleeding risk, they may be appropriate for short-term anticoagulation. Based on this individual patient's stroke and bleeding risk, a shared decision has been made to refer the patient for consideration of Watchman left atrial appendage closure utilizing the [employer] shared decision tool.          Follow-up: Return in about  3 months (around 09/30/2024).  Signed, David DASEN. Barbaraann, MD, [facility]  [facility]  [address] [place], [place] [address] [phone]  2:51 PM   "

## 2024-07-02 ENCOUNTER — Encounter: Payer: Self-pay | Admitting: Cardiovascular Disease

## 2024-07-02 ENCOUNTER — Ambulatory Visit: Attending: Internal Medicine | Admitting: Cardiovascular Disease

## 2024-07-02 VITALS — BP 146/78 | HR 65 | Ht 73.0 in | Wt 256.8 lb

## 2024-07-02 DIAGNOSIS — I10 Essential (primary) hypertension: Secondary | ICD-10-CM | POA: Diagnosis not present

## 2024-07-02 DIAGNOSIS — I480 Paroxysmal atrial fibrillation: Secondary | ICD-10-CM | POA: Diagnosis not present

## 2024-07-02 DIAGNOSIS — I421 Obstructive hypertrophic cardiomyopathy: Secondary | ICD-10-CM

## 2024-07-02 MED ORDER — LOSARTAN POTASSIUM 25 MG PO TABS: 25.0000 mg | ORAL_TABLET | Freq: Every day | ORAL | 3 refills | Status: AC

## 2024-07-02 NOTE — Patient Instructions (Signed)
 Medication Instructions:  Your physician has recommended you make the following change in your medication:  START: Losartan  25 mg once daily  *If you need a refill on your cardiac medications before your next appointment, please call your pharmacy*  Please take your blood pressure daily for 2 weeks and send in a MyChart message. Please include heart rates. (One message at the end of the 2 weeks).   HOW TO TAKE YOUR BLOOD PRESSURE: Rest 5 minutes before taking your blood pressure. Dont smoke or drink caffeinated beverages for at least 30 minutes before. Take your blood pressure before (not after) you eat. Sit comfortably with your back supported and both feet on the floor (dont cross your legs). Elevate your arm to heart level on a table or a desk. Use the proper sized cuff. It should fit smoothly and snugly around your bare upper arm. There should be enough room to slip a fingertip under the cuff. The bottom edge of the cuff should be 1 inch above the crease of the elbow. Ideally, take 3 measurements at one sitting and record the average.  Blood Pressure Log Date/Time Medications taken? (Y/N) Blood Pressure Heart Rate/Pulse                                                                                                                                                                                         Testing/Procedures: Your physician has requested that you have an echocardiogram. Echocardiography is a painless test that uses sound waves to create images of your heart. It provides your doctor with information about the size and shape of your heart and how well your hearts chambers and valves are working. This procedure takes approximately one hour. There are no restrictions for this procedure. Please do NOT wear cologne, perfume, aftershave, or lotions (deodorant is allowed). Please arrive 15 minutes prior to your appointment time.  Please note: We ask at that you  not bring children with you during ultrasound (echo/ vascular) testing. Due to room size and safety concerns, children are not allowed in the ultrasound rooms during exams. Our front office staff cannot provide observation of children in our lobby area while testing is being conducted. An adult accompanying a patient to their appointment will only be allowed in the ultrasound room at the discretion of the ultrasound technician under special circumstances. We apologize for any inconvenience.   Follow-Up: At [facility], you and your health needs are our priority.  As part of our continuing mission to provide you with exceptional heart care, our providers are all part of one team.  This team includes your primary Cardiologist (physician) and Advanced  Practice Providers or APPs (Physician Assistants and Nurse Practitioners) who all work together to provide you with the care you need, when you need it.  Your next appointment:   3 month(s)  Provider:   [name] Decent, MD

## 2024-07-31 ENCOUNTER — Ambulatory Visit (HOSPITAL_COMMUNITY)

## 2024-09-30 ENCOUNTER — Ambulatory Visit: Admitting: Cardiovascular Disease

## 2024-12-22 ENCOUNTER — Ambulatory Visit: Admitting: Internal Medicine

## 2025-06-19 ENCOUNTER — Ambulatory Visit
